# Patient Record
Sex: Female | Born: 1950 | Hispanic: No | Marital: Married
Health system: Southern US, Academic
[De-identification: ages and names within clinical notes are randomized; demographics above are authoritative.]

## PROBLEM LIST (undated history)

## (undated) ENCOUNTER — Ambulatory Visit

## (undated) ENCOUNTER — Encounter

## (undated) ENCOUNTER — Telehealth

## (undated) ENCOUNTER — Encounter
Attending: Student in an Organized Health Care Education/Training Program | Primary: Student in an Organized Health Care Education/Training Program

## (undated) ENCOUNTER — Ambulatory Visit: Payer: Medicare (Managed Care) | Attending: Dermatology | Primary: Dermatology

## (undated) ENCOUNTER — Ambulatory Visit
Payer: MEDICARE | Attending: Student in an Organized Health Care Education/Training Program | Primary: Student in an Organized Health Care Education/Training Program

## (undated) ENCOUNTER — Ambulatory Visit: Payer: MEDICARE | Attending: Dermatology | Primary: Dermatology

## (undated) ENCOUNTER — Encounter: Attending: Dermatology | Primary: Dermatology

## (undated) DIAGNOSIS — K219 Gastro-esophageal reflux disease without esophagitis: Secondary | ICD-10-CM

## (undated) DIAGNOSIS — R079 Chest pain, unspecified: Secondary | ICD-10-CM

## (undated) DIAGNOSIS — E7800 Pure hypercholesterolemia, unspecified: Secondary | ICD-10-CM

## (undated) DIAGNOSIS — B029 Zoster without complications: Secondary | ICD-10-CM

## (undated) DIAGNOSIS — K759 Inflammatory liver disease, unspecified: Secondary | ICD-10-CM

## (undated) DIAGNOSIS — L409 Psoriasis, unspecified: Secondary | ICD-10-CM

## (undated) DIAGNOSIS — I10 Essential (primary) hypertension: Secondary | ICD-10-CM

## (undated) DIAGNOSIS — R42 Dizziness and giddiness: Secondary | ICD-10-CM

## (undated) DIAGNOSIS — N200 Calculus of kidney: Secondary | ICD-10-CM

## (undated) DIAGNOSIS — R634 Abnormal weight loss: Secondary | ICD-10-CM

## (undated) DIAGNOSIS — R000 Tachycardia, unspecified: Secondary | ICD-10-CM

## (undated) DIAGNOSIS — G43909 Migraine, unspecified, not intractable, without status migrainosus: Secondary | ICD-10-CM

## (undated) DIAGNOSIS — E119 Type 2 diabetes mellitus without complications: Secondary | ICD-10-CM

## (undated) HISTORY — DX: Psoriasis, unspecified: L40.9

## (undated) HISTORY — DX: Abnormal weight loss: R63.4

## (undated) HISTORY — DX: Chest pain, unspecified: R07.9

## (undated) HISTORY — PX: BREAST BIOPSY: SHX20

## (undated) HISTORY — DX: Tachycardia, unspecified: R00.0

## (undated) HISTORY — DX: Migraine, unspecified, not intractable, without status migrainosus: G43.909

---

## 2015-09-26 LAB — HM COLONOSCOPY

## 2017-01-09 ENCOUNTER — Other Ambulatory Visit: Payer: Self-pay

## 2017-01-09 ENCOUNTER — Ambulatory Visit
Admission: EM | Admit: 2017-01-09 | Discharge: 2017-01-09 | Disposition: A | Discharge status: Home / Self Care | Payer: Medicare Other | Attending: Family Medicine | Admitting: Family Medicine

## 2017-01-09 DIAGNOSIS — Z760 Encounter for issue of repeat prescription: Secondary | ICD-10-CM | POA: Diagnosis not present

## 2017-01-09 DIAGNOSIS — R51 Headache: Secondary | ICD-10-CM | POA: Diagnosis not present

## 2017-01-09 DIAGNOSIS — Z794 Long term (current) use of insulin: Secondary | ICD-10-CM

## 2017-01-09 DIAGNOSIS — I10 Essential (primary) hypertension: Secondary | ICD-10-CM

## 2017-01-09 DIAGNOSIS — R0789 Other chest pain: Secondary | ICD-10-CM

## 2017-01-09 DIAGNOSIS — E139 Other specified diabetes mellitus without complications: Secondary | ICD-10-CM

## 2017-01-09 HISTORY — DX: Pure hypercholesterolemia, unspecified: E78.00

## 2017-01-09 HISTORY — DX: Calculus of kidney: N20.0

## 2017-01-09 HISTORY — DX: Type 2 diabetes mellitus without complications: E11.9

## 2017-01-09 HISTORY — DX: Gastro-esophageal reflux disease without esophagitis: K21.9

## 2017-01-09 HISTORY — DX: Essential (primary) hypertension: I10

## 2017-01-09 NOTE — ED Triage Notes (Signed)
Pt reports she is visiting her son and from [place] for 2 months and is running out of her meds. Takes 11 medications, 10 of which are prescription. FSBS this a.m. Was 158.

## 2017-01-09 NOTE — ED Provider Notes (Signed)
[facility]    CSN: [id number] Arrival date & time: 01/09/17  1240     History   Chief Complaint Chief Complaint  Patient presents with  . Medication Refill    HPI Elizabeth Klein is a 66 y.o. female.   Patient is a 67 year old female who presents with her son who states that she is visiting from [place] and has been here for a couple months and is beginning to run out of her medications. She states she has run out of her Trulicity and has actually been out for 2-3 weeks. She reports that she has about 4-5 days of the rest of her medications. She reports occasional headache and some intermittent chest pain. Chest pain she reports will last for approximately 10 seconds but relieves with just rest. She reports she last saw her doctor back in September. She also reports acid reflux symptoms as well. She denies any shortness of breath and states that the cold weather has actually been improvement in her respiratory symptoms.  Patient states that [facility] has not [facility] in [place], or her doctor regarding running out of medicines.       Past Medical History:  Diagnosis Date  . Acid reflux   . Diabetes mellitus without complication ([place])   . High cholesterol   . Hypertension   . Kidney stones     There are no active problems to display for this patient.   Past Surgical History:  Procedure Laterality Date  . CESAREAN SECTION      OB History    No data available       Home Medications    Prior to Admission medications   Medication Sig Start Date End Date Taking? Authorizing Provider  aspirin EC 81 MG tablet Take 81 mg daily by mouth.   Yes [provider]  atorvastatin (LIPITOR) 80 MG tablet Take 80 mg daily by mouth.   Yes [provider]  Dulaglutide (TRULICITY) 8.14 GY/1.8HU SOPN Inject into the skin.   Yes [provider]  fenofibrate 160 MG tablet Take 160 mg daily by  mouth.   Yes [provider]  insulin glargine (LANTUS) 100 UNIT/ML injection Inject 30 Units at bedtime into the skin.   Yes [provider]  losartan (COZAAR) 25 MG tablet Take 25 mg daily by mouth.   Yes [provider]  metFORMIN (GLUCOPHAGE) 1000 MG tablet Take 1,000 mg 2 (two) times daily with a meal by mouth.   Yes [provider]  metoprolol tartrate (LOPRESSOR) 50 MG tablet Take 50 mg 2 (two) times daily by mouth.   Yes [provider]  montelukast (SINGULAIR) 10 MG tablet Take 10 mg at bedtime by mouth.   Yes [provider]  omeprazole (PRILOSEC) 20 MG capsule Take 20 mg daily by mouth.   Yes [provider]  traZODone (DESYREL) 50 MG tablet Take 50 mg at bedtime by mouth.   Yes [provider]    Family History Family History  Problem Relation Age of Onset  . Diabetes Brother   . Diabetes Brother   . Diabetes Brother     Social History Social History   Tobacco Use  . Smoking status: Never Smoker  . Smokeless tobacco: Never Used  Substance Use Topics  . Alcohol use: No    Frequency: Never  . Drug use: No     Allergies   Patient has no known allergies.   Review  of Systems Review of Systems  As noted above in history of present illness. Other systems reviewed and found to be negative.   Physical Exam Triage Vital Signs ED Triage Vitals  Enc Vitals Group     BP 01/09/17 1302 121/66     Pulse Rate 01/09/17 1302 65     Resp 01/09/17 1302 16     Temp 01/09/17 1302 98 F (36.7 C)     Temp Source 01/09/17 1302 Oral     SpO2 01/09/17 1302 100 %     Weight 01/09/17 1302 138 lb (62.6 kg)     Height 01/09/17 1302 5' (1.524 m)     Head Circumference --      Peak Flow --      Pain Score 01/09/17 1303 4     Pain Loc --      Pain Edu? --      Excl. in [place]? --    No data found.  Updated Vital Signs BP 121/66 (BP Location: Left Arm)   Pulse 65   Temp 98 F (36.7 C) (Oral)   Resp 16   Ht  5' (1.524 m)   Wt 138 lb (62.6 kg)   SpO2 100%   BMI 26.95 kg/m   Visual Acuity Right Eye Distance:   Left Eye Distance:   Bilateral Distance:    Right Eye Near:   Left Eye Near:    Bilateral Near:     Physical Exam  Constitutional: She is oriented to person, place, and time. She appears well-developed and well-nourished. She appears distressed.  HENT:  Head: Atraumatic.  Eyes: EOM are normal. Pupils are equal, round, and reactive to light.  Cardiovascular: Normal rate, regular rhythm and normal heart sounds.  No murmur heard. Pulmonary/Chest: Effort normal and breath sounds normal. No stridor. No respiratory distress. She has no wheezes.  Abdominal: Soft. Bowel sounds are normal.  Musculoskeletal: Normal range of motion.  Neurological: She is alert and oriented to person, place, and time. No cranial nerve deficit.  Skin: Skin is warm and dry.     UC Treatments / Results  Labs (all labs ordered are listed, but only abnormal results are displayed) Labs Reviewed - No data to display  EKG  EKG Interpretation None       Radiology No results found.  Procedures Procedures (including critical care time)  Medications Ordered in UC Medications - No data to display   Initial Impression / Assessment and Plan / UC Course  I have reviewed the triage vital signs and the nursing notes.  Pertinent labs & imaging results that were available during my care of the patient were reviewed by me and considered in my medical decision making (see chart for details).    [facility]'re able to refill her trazodone and Trulicity today and will have that ready for her. However her other medications were received on September 17 with 90 day supplies.   Final Clinical Impressions(s) / UC Diagnoses   Final diagnoses:  None   After speaking with patient's heard her [facility] and [facility]odone. I recommend him taking a list of her  medications and how she was taking them to make sure that she was given the right amount of medications. This is something that would be best worked out [facility] so she will he has medications available on refill after the 90 days because she has not set up with a new  primary care provider she is still unsure of moving here.  ED Discharge Orders    None       Controlled Substance Prescriptions [place] Controlled Substance Registry consulted? Not Applicable   Luvenia Redden, PA-C 01/09/17 1350

## 2017-01-09 NOTE — Discharge Instructions (Signed)
-  [facility] [place] -[facility] and [facility] and how taking[facility]December 17

## 2017-03-19 ENCOUNTER — Ambulatory Visit (INDEPENDENT_AMBULATORY_CARE_PROVIDER_SITE_OTHER): Payer: Self-pay | Admitting: Family Medicine

## 2017-03-19 ENCOUNTER — Encounter: Payer: Self-pay | Admitting: Family Medicine

## 2017-03-19 VITALS — BP 110/77 | HR 77 | Resp 16 | Ht 60.0 in | Wt 144.6 lb

## 2017-03-19 DIAGNOSIS — E1165 Type 2 diabetes mellitus with hyperglycemia: Secondary | ICD-10-CM

## 2017-03-19 DIAGNOSIS — Z87442 Personal history of urinary calculi: Secondary | ICD-10-CM

## 2017-03-19 DIAGNOSIS — G4700 Insomnia, unspecified: Secondary | ICD-10-CM

## 2017-03-19 DIAGNOSIS — R2681 Unsteadiness on feet: Secondary | ICD-10-CM

## 2017-03-19 DIAGNOSIS — F5101 Primary insomnia: Secondary | ICD-10-CM | POA: Insufficient documentation

## 2017-03-19 DIAGNOSIS — J309 Allergic rhinitis, unspecified: Secondary | ICD-10-CM | POA: Insufficient documentation

## 2017-03-19 DIAGNOSIS — Z1239 Encounter for other screening for malignant neoplasm of breast: Secondary | ICD-10-CM

## 2017-03-19 DIAGNOSIS — E1169 Type 2 diabetes mellitus with other specified complication: Secondary | ICD-10-CM | POA: Insufficient documentation

## 2017-03-19 DIAGNOSIS — L409 Psoriasis, unspecified: Secondary | ICD-10-CM

## 2017-03-19 DIAGNOSIS — I2510 Atherosclerotic heart disease of native coronary artery without angina pectoris: Secondary | ICD-10-CM

## 2017-03-19 DIAGNOSIS — E1121 Type 2 diabetes mellitus with diabetic nephropathy: Secondary | ICD-10-CM

## 2017-03-19 DIAGNOSIS — Z1331 Encounter for screening for depression: Secondary | ICD-10-CM

## 2017-03-19 DIAGNOSIS — L281 Prurigo nodularis: Secondary | ICD-10-CM | POA: Insufficient documentation

## 2017-03-19 DIAGNOSIS — I10 Essential (primary) hypertension: Secondary | ICD-10-CM

## 2017-03-19 DIAGNOSIS — E785 Hyperlipidemia, unspecified: Secondary | ICD-10-CM

## 2017-03-19 DIAGNOSIS — E119 Type 2 diabetes mellitus without complications: Secondary | ICD-10-CM

## 2017-03-19 DIAGNOSIS — Z1231 Encounter for screening mammogram for malignant neoplasm of breast: Secondary | ICD-10-CM

## 2017-03-19 DIAGNOSIS — Z23 Encounter for immunization: Secondary | ICD-10-CM

## 2017-03-19 DIAGNOSIS — Z794 Long term (current) use of insulin: Secondary | ICD-10-CM

## 2017-03-19 DIAGNOSIS — G4762 Sleep related leg cramps: Secondary | ICD-10-CM

## 2017-03-19 DIAGNOSIS — E118 Type 2 diabetes mellitus with unspecified complications: Secondary | ICD-10-CM | POA: Insufficient documentation

## 2017-03-19 MED ORDER — ZOSTER VAC RECOMB ADJUVANTED 50 MCG/0.5ML IM SUSR: 0.5000 mL | Freq: Once | INTRAMUSCULAR | 1 refills | Status: AC

## 2017-03-19 MED ORDER — TETANUS-DIPHTH-ACELL PERTUSSIS 5-2.5-18.5 LF-MCG/0.5 IM SUSP: 0.5000 mL | Freq: Once | INTRAMUSCULAR | 0 refills | Status: AC

## 2017-03-19 NOTE — Patient Instructions (Signed)
Taper off omeprazole (take one tablet every other day for 2 weeks then every third day for two weeks then stop). You may also stop fish oil.

## 2017-03-19 NOTE — Progress Notes (Signed)
Date:  03/19/2017   Name:  Elizabeth Klein   DOB:  01/16/1951   MRN:  [id number]  PCP:  Adline Potter, MD    Chief Complaint: Establish Care (Moved from [place].... ) and Diabetes (BS ranges 85-140 but often can get to over 200 in afternoon. Lowest was 2 years ago or so and it dropped to 33.)   History of Present Illness:  This is a 67 y.o. female seen for initial visit. Moved here from [place] to be near son. T2DM on metformin/Lantus/Trulicity, BGs well controlled at home. Insomnia usually well controlled on trazodone past year. Takes omeprazole since UGI last year showed gastritis, no current sxs, has not tried taper. Kidney stone 2018, none since. Psoriasis intolerant [place], on Lidex and [place], would like to see derm locally. Singulair for allergies/asthma (not sure which). CT angiogram last year showed CAD, no stenting recommended, Lipitor dose increased then. On metoprolol for palpitations, on losartan but denies HTN or microalbuminuria. Take vit C for cold prevention and B complex for joints, also fish oil for heart. Told cataracts OU but not bad enough to operate, saw optho in August. C/o NLC and occ numbness in feet. Father died 72 old age, mother died 9 CVA, two brothers died heart dz, sister with DM. Colonoscopy 2018 showed polyps. Had flu imm in Sept, pneumo imm two years ago, tet status unknown, no zoster imm, no mammo in 6 years.  Review of Systems:  Review of Systems  Constitutional: Negative for chills and fever.  HENT: Negative for ear pain, sore throat and trouble swallowing.   Eyes: Negative for pain.  Respiratory: Negative for cough and shortness of breath.   Cardiovascular: Negative for chest pain and leg swelling.  Gastrointestinal: Negative for abdominal pain.  Endocrine: Negative for polydipsia and polyuria.  Genitourinary: Negative for difficulty urinating and flank pain.  Musculoskeletal: Negative for joint swelling.  Neurological: Negative for syncope  and light-headedness.  Hematological: Negative for adenopathy.    Patient Active Problem List   Diagnosis Date Noted  . Positive depression screening 03/19/2017  . Diabetes mellitus type 2, controlled, without complications ([place]) 41/32/4401  . Hypertension 03/19/2017  . CAD (coronary artery disease) 03/19/2017  . History of kidney stones 03/19/2017  . Psoriasis 03/19/2017  . Hyperlipidemia 03/19/2017  . Nocturnal leg cramps 03/19/2017  . Gait instability 03/19/2017  . Insomnia 03/19/2017    Prior to Admission medications   Medication Sig Start Date End Date Taking? Authorizing Provider  aspirin EC 81 MG tablet Take 81 mg daily by mouth.   Yes [provider]  atorvastatin (LIPITOR) 80 MG tablet Take 80 mg daily by mouth.   Yes [provider]  b complex vitamins capsule Take 1 capsule by mouth daily.   Yes [provider]  Calcipotriene-Betameth Diprop (ENSTILAR) 0.005-0.064 % FOAM Apply 1 application topically 2 (two) times daily.   Yes [provider]  Dulaglutide (TRULICITY) 0.27 OZ/3.6UY SOPN Inject 0.75 mg into the skin once a week.   Yes [provider]  fluocinonide ointment (LIDEX) 4.03 % Apply 1 application topically 2 (two) times daily.   Yes [provider]  insulin glargine (LANTUS) 100 UNIT/ML injection Inject 35 Units into the skin at bedtime.    Yes [provider]  losartan (COZAAR) 25 MG tablet Take 25 mg daily by mouth.   Yes [provider]  metFORMIN (GLUCOPHAGE) 1000 MG tablet Take 1,000 mg 2 (two) times daily with a meal by mouth.  Yes [provider]  metoprolol tartrate (LOPRESSOR) 50 MG tablet Take 50 mg 2 (two) times daily by mouth.   Yes [provider]  montelukast (SINGULAIR) 10 MG tablet Take 10 mg at bedtime by mouth.   Yes [provider]  traZODone (DESYREL) 50 MG tablet Take 50 mg at bedtime by mouth.   Yes [provider]  vitamin C (ASCORBIC  ACID) 500 MG tablet Take 500 mg by mouth daily.   Yes [provider]  Tdap (BOOSTRIX) 5-2.5-18.5 LF-MCG/0.5 injection Inject 0.5 mLs into the muscle once for 1 dose. 03/19/17 03/19/17  Aleck Locklin, Gwyndolyn Saxon, MD  Zoster Vaccine Adjuvanted [facility]) injection Inject 0.5 mLs into the muscle once for 1 dose. 03/19/17 03/19/17  Adline Potter, MD    No Known Allergies  Past Surgical History:  Procedure Laterality Date  . CESAREAN SECTION      Social History   Tobacco Use  . Smoking status: Never Smoker  . Smokeless tobacco: Never Used  Substance Use Topics  . Alcohol use: No    Frequency: Never  . Drug use: No    Family History  Problem Relation Age of Onset  . Diabetes Brother   . Heart attack Brother   . Diabetes Brother   . Heart attack Brother   . Diabetes Brother     Medication list has been reviewed and updated.  Physical Examination: BP 110/77   Pulse 77   Resp 16   Ht 5' (1.524 m)   Wt 144 lb 9.6 oz (65.6 kg)   SpO2 98%   BMI 28.24 kg/m   Physical Exam  Constitutional: She is oriented to person, place, and time. She appears well-developed and well-nourished.  HENT:  Head: Normocephalic and atraumatic.  Right Ear: External ear normal.  Left Ear: External ear normal.  Nose: Nose normal.  Mouth/Throat: Oropharynx is clear and moist.  TMs clear  Eyes: Conjunctivae and EOM are normal. Pupils are equal, round, and reactive to light.  Neck: Neck supple. No thyromegaly present.  Cardiovascular: Normal rate, regular rhythm and normal heart sounds.  Pulmonary/Chest: Effort normal and breath sounds normal.  Abdominal: Soft. She exhibits no distension and no mass. There is no tenderness.  Musculoskeletal: She exhibits no edema.  Romberg wobbly, gait with en bloc turning  Lymphadenopathy:    She has no cervical adenopathy.  Neurological: She is alert and oriented to person, place, and time. Coordination normal.  SLUMS 27/30  Skin: Skin is warm and dry.  Scatter  psoriatic plaques  Psychiatric: She has a normal mood and affect. Her behavior is normal.  GDS 4/15  Nursing note and vitals reviewed.   Assessment and Plan:  1. Controlled type 2 diabetes mellitus with neuropathy, with long-term current use of insulin (HCC) Well controlled on metformin/Lantus/Trulicity, consider gabapentin - TSH - HgB A1c - Urine Microalbumin w/creat. ratio  2. Essential hypertension Well controlled on metoprolol/losartan - Comprehensive Metabolic Panel (CMET) - CBC  3. Coronary artery disease involving native coronary artery of native heart without angina pectoris Stable on BB/ARB/statin/asa  4. Insomnia, unspecified type Well controlled on trazodone  5. Psoriasis Marginal control on Lidex/Enstilar - Ambulatory referral to Dermatology  6. Hyperlipidemia, unspecified hyperlipidemia type On increased dose Lipitor - Lipid Profile  7. Nocturnal leg cramps Unclear etiology  8. Gait instability - B12  9. Allergic rhinitis, unspecified seasonality, unspecified trigger Adequate control on Singulair, may be able to d/c  10. History of kidney stones  11. Positive depression screening  Consider changing trazodone to Remeron  12. Breast cancer screening - MM Digital Screening; Future  13. Need for pneumococcal vaccination - Pneumococcal conjugate vaccine 13-valent  14. Need for diphtheria-tetanus-pertussis (Tdap) vaccine - Tdap (BOOSTRIX) 5-2.5-18.5 LF-MCG/0.5 injection; Inject 0.5 mLs into the muscle once for 1 dose.  Dispense: 0.5 mL; Refill: 0  15. Need for zoster vaccination - Zoster Vaccine Adjuvanted [facility]) injection; Inject 0.5 mLs into the muscle once for 1 dose.  Dispense: 0.5 mL; Refill: 1  Return in about 4 weeks (around 04/16/2017).  One hour spent with pt/son over half in counseling  Clemie General M. [facility]  03/19/2017

## 2017-03-20 ENCOUNTER — Other Ambulatory Visit: Payer: Self-pay | Admitting: Family Medicine

## 2017-03-20 LAB — COMPREHENSIVE METABOLIC PANEL
ALBUMIN: 4.5 g/dL (ref 3.6–4.8)
ALK PHOS: 44 IU/L (ref 39–117)
ALT: 37 IU/L — ABNORMAL HIGH (ref 0–32)
AST: 34 IU/L (ref 0–40)
Albumin/Globulin Ratio: 1.6 (ref 1.2–2.2)
BUN / CREAT RATIO: 22 (ref 12–28)
BUN: 26 mg/dL (ref 8–27)
Bilirubin Total: <0.2 mg/dL (ref 0.0–1.2)
CO2: 22 mmol/L (ref 20–29)
Calcium: 10 mg/dL (ref 8.7–10.3)
Chloride: 104 mmol/L (ref 96–106)
Creatinine, Ser: 1.2 mg/dL — ABNORMAL HIGH (ref 0.57–1.00)
GFR, EST AFRICAN AMERICAN: 55 mL/min/{1.73_m2} — AB (ref >59)
GFR, EST NON AFRICAN AMERICAN: 48 mL/min/{1.73_m2} — AB (ref >59)
GLOBULIN, TOTAL: 2.9 g/dL (ref 1.5–4.5)
Glucose: 198 mg/dL — ABNORMAL HIGH (ref 65–99)
Potassium: 5.6 mmol/L — ABNORMAL HIGH (ref 3.5–5.2)
SODIUM: 141 mmol/L (ref 134–144)
TOTAL PROTEIN: 7.4 g/dL (ref 6.0–8.5)

## 2017-03-20 LAB — CBC
HEMATOCRIT: 36.8 % (ref 34.0–46.6)
Hemoglobin: 11.9 g/dL (ref 11.1–15.9)
MCH: 28.6 pg (ref 26.6–33.0)
MCHC: 32.3 g/dL (ref 31.5–35.7)
MCV: 89 fL (ref 79–97)
PLATELETS: 429 10*3/uL — AB (ref 150–379)
RBC: 4.16 x10E6/uL (ref 3.77–5.28)
RDW: 14.2 % (ref 12.3–15.4)
WBC: 10.4 10*3/uL (ref 3.4–10.8)

## 2017-03-20 LAB — MICROALBUMIN / CREATININE URINE RATIO
Creatinine, Urine: 150.2 mg/dL
Microalb/Creat Ratio: 51.7 mg/g creat — ABNORMAL HIGH (ref 0.0–30.0)
Microalbumin, Urine: 77.7 ug/mL

## 2017-03-20 LAB — LIPID PANEL
CHOL/HDL RATIO: 3.6 ratio (ref 0.0–4.4)
Cholesterol, Total: 149 mg/dL (ref 100–199)
HDL: 41 mg/dL (ref >39)
LDL Calculated: 85 mg/dL (ref 0–99)
Triglycerides: 116 mg/dL (ref 0–149)
VLDL Cholesterol Cal: 23 mg/dL (ref 5–40)

## 2017-03-20 LAB — HEMOGLOBIN A1C
Est. average glucose Bld gHb Est-mCnc: 232 mg/dL
HEMOGLOBIN A1C: 9.7 % — AB (ref 4.8–5.6)

## 2017-03-20 LAB — TSH: TSH: 1.9 u[IU]/mL (ref 0.450–4.500)

## 2017-03-20 LAB — VITAMIN B12: Vitamin B-12: 408 pg/mL (ref 232–1245)

## 2017-03-20 MED ORDER — LOSARTAN POTASSIUM 50 MG PO TABS: 50.0000 mg | ORAL_TABLET | Freq: Every day | ORAL | 2 refills | Status: DC

## 2017-03-20 MED ORDER — INSULIN GLARGINE 100 UNIT/ML ~~LOC~~ SOLN: 45.0000 [IU] | Freq: Every day | SUBCUTANEOUS | Status: DC

## 2017-04-16 ENCOUNTER — Ambulatory Visit: Payer: Medicare Other | Admitting: Family Medicine

## 2017-04-30 ENCOUNTER — Ambulatory Visit: Payer: Medicare Other | Admitting: Family Medicine

## 2017-04-30 ENCOUNTER — Encounter: Payer: Self-pay | Admitting: Family Medicine

## 2017-04-30 VITALS — BP 124/82 | HR 84 | Resp 16 | Ht 60.0 in | Wt 147.0 lb

## 2017-04-30 DIAGNOSIS — I2510 Atherosclerotic heart disease of native coronary artery without angina pectoris: Secondary | ICD-10-CM | POA: Diagnosis not present

## 2017-04-30 DIAGNOSIS — E1140 Type 2 diabetes mellitus with diabetic neuropathy, unspecified: Secondary | ICD-10-CM

## 2017-04-30 DIAGNOSIS — E1122 Type 2 diabetes mellitus with diabetic chronic kidney disease: Secondary | ICD-10-CM | POA: Diagnosis not present

## 2017-04-30 DIAGNOSIS — Z0000 Encounter for general adult medical examination without abnormal findings: Secondary | ICD-10-CM | POA: Diagnosis not present

## 2017-04-30 DIAGNOSIS — E785 Hyperlipidemia, unspecified: Secondary | ICD-10-CM

## 2017-04-30 DIAGNOSIS — L409 Psoriasis, unspecified: Secondary | ICD-10-CM

## 2017-04-30 DIAGNOSIS — G4700 Insomnia, unspecified: Secondary | ICD-10-CM | POA: Diagnosis not present

## 2017-04-30 DIAGNOSIS — M79672 Pain in left foot: Secondary | ICD-10-CM | POA: Diagnosis not present

## 2017-04-30 DIAGNOSIS — R809 Proteinuria, unspecified: Secondary | ICD-10-CM

## 2017-04-30 DIAGNOSIS — N183 Chronic kidney disease, stage 3 unspecified: Secondary | ICD-10-CM | POA: Insufficient documentation

## 2017-04-30 DIAGNOSIS — I10 Essential (primary) hypertension: Secondary | ICD-10-CM | POA: Diagnosis not present

## 2017-04-30 MED ORDER — METOPROLOL SUCCINATE ER 100 MG PO TB24: 100.0000 mg | ORAL_TABLET | Freq: Every day | ORAL | 2 refills | Status: DC

## 2017-04-30 MED ORDER — FLUOCINONIDE 0.05 % EX OINT: 1.0000 "application " | TOPICAL_OINTMENT | Freq: Two times a day (BID) | CUTANEOUS | 2 refills | Status: DC

## 2017-04-30 MED ORDER — GLUCOSE BLOOD VI STRP: ORAL_STRIP | 12 refills | Status: DC

## 2017-04-30 MED ORDER — SYRINGE (DISPOSABLE) 30 ML MISC: 12 refills | Status: DC

## 2017-04-30 MED ORDER — INSULIN GLARGINE 100 UNIT/ML ~~LOC~~ SOLN: 45.0000 [IU] | Freq: Every day | SUBCUTANEOUS | 2 refills | Status: DC

## 2017-04-30 MED ORDER — ACCU-CHEK FASTCLIX LANCETS MISC: 12 refills | Status: DC

## 2017-04-30 NOTE — Patient Instructions (Addendum)
Attempt to wean off omeprazole. Stop montelukast.

## 2017-04-30 NOTE — Progress Notes (Signed)
Date:  04/30/2017   Name:  Elizabeth Klein   DOB:  Jul 19, 1950   MRN:  [id number]  PCP:  Adline Potter, MD    Chief Complaint: Diabetes (BS range 110-120 - use mail order -needs syringe for insulin. ); Fatigue (very tired and takes a while to do things around house. ); and Hypertension ([facility] then the rest at [facility] order.)   History of Present Illness:  This is a 67 y.o. female seen for one month f/u from initial visit. T2DM marginally controlled, Lantus increased last visit. Losartan also increased due to elevated MCR. Has not yet seen derm for psoriasis. No sign AR or asthma sxs. Still taking Prilosec qhs. Admits might be depressed but declines antidepressant, insomnia well controlled on trazodone. Needs refills metoprolol, Lantus, Lidex.  Review of Systems:  Review of Systems  Constitutional: Negative for chills and fever.  Respiratory: Negative for cough and shortness of breath.   Cardiovascular: Negative for chest pain and leg swelling.  Genitourinary: Negative for difficulty urinating.  Neurological: Negative for syncope and light-headedness.    Patient Active Problem List   Diagnosis Date Noted  . Microalbuminuria 04/30/2017  . Positive depression screening 03/19/2017  . Type 2 diabetes, controlled, with neuropathy ([place]) 03/19/2017  . Hypertension 03/19/2017  . CAD (coronary artery disease) 03/19/2017  . History of kidney stones 03/19/2017  . Psoriasis 03/19/2017  . Hyperlipidemia 03/19/2017  . Nocturnal leg cramps 03/19/2017  . Gait instability 03/19/2017  . Insomnia 03/19/2017  . Allergic rhinitis 03/19/2017    Prior to Admission medications   Medication Sig Start Date End Date Taking? Authorizing Provider  acetaminophen (TYLENOL) 500 MG tablet Take 1,000 mg by mouth every 8 (eight) hours as needed.   Yes [provider]  aspirin EC 81 MG tablet Take 81 mg daily by mouth.   Yes [provider]  atorvastatin (LIPITOR) 80 MG  tablet Take 80 mg daily by mouth.   Yes [provider]  b complex vitamins capsule Take 1 capsule by mouth daily.   Yes [provider]  Calcipotriene-Betameth Diprop (ENSTILAR) 0.005-0.064 % FOAM Apply 1 application topically 2 (two) times daily.   Yes [provider]  Dulaglutide (TRULICITY) 8.52 DP/8.2UM SOPN Inject 0.75 mg into the skin once a week.   Yes [provider]  fluocinonide ointment (LIDEX) 3.53 % Apply 1 application topically 2 (two) times daily. 04/30/17  Yes Kyran Whittier, Gwyndolyn Saxon, MD  insulin glargine (LANTUS) 100 UNIT/ML injection Inject 0.45 mLs (45 Units total) into the skin at bedtime. 04/30/17  Yes Syndey Jaskolski, Gwyndolyn Saxon, MD  losartan (COZAAR) 50 MG tablet Take 1 tablet (50 mg total) by mouth daily. 03/20/17  Yes Therron Sells, Gwyndolyn Saxon, MD  metFORMIN (GLUCOPHAGE) 1000 MG tablet Take 1,000 mg 2 (two) times daily with a meal by mouth.   Yes [provider]  traZODone (DESYREL) 50 MG tablet Take 50 mg at bedtime by mouth.   Yes [provider]  vitamin C (ASCORBIC ACID) 500 MG tablet Take 500 mg by mouth daily.   Yes [provider]  ACCU-CHEK FASTCLIX LANCETS MISC Use with accucheck machine to check BS up to 3 times daily for DM2 ICD10 E11.9 04/30/17   Jeromie Gainor, Gwyndolyn Saxon, MD  glucose blood test strip Use to check BS with Accucheck Meter up to 3 times daily for DM2 E11.9 04/30/17   Chandy Tarman, Gwyndolyn Saxon, MD  metoprolol succinate (TOPROL-XL) 100 MG 24 hr tablet Take 1 tablet (100 mg total) by mouth daily.  Take with or immediately following a meal. 04/30/17   Jemiah [place]burg, Gwyndolyn Saxon, MD  Syringe, Disposable, (B-D 30CC SYRINGE) 30 ML MISC Use with Lantus Injections for DM2 ICD 10 E11.9 04/30/17   Adline Potter, MD    No Known Allergies  Past Surgical History:  Procedure Laterality Date  . CESAREAN SECTION      Social History   Tobacco Use  . Smoking status: Never Smoker  . Smokeless tobacco: Never Used  Substance Use Topics  . Alcohol use: No    Frequency:  Never  . Drug use: No    Family History  Problem Relation Age of Onset  . Diabetes Brother   . Heart attack Brother   . Diabetes Brother   . Heart attack Brother   . Diabetes Brother     Medication list has been reviewed and updated.  Physical Examination: BP 124/82   Pulse 84   Resp 16   Ht 5' (1.524 m)   Wt 147 lb (66.7 kg)   SpO2 98%   BMI 28.71 kg/m   Physical Exam  Constitutional: She appears well-developed and well-nourished.  Cardiovascular: Normal rate, regular rhythm and normal heart sounds.  Pulmonary/Chest: Effort normal and breath sounds normal.  Musculoskeletal: She exhibits no edema.  Neurological: She is alert.  Skin: Skin is warm and dry.  Psychiatric: She has a normal mood and affect. Her behavior is normal.  Nursing note and vitals reviewed.   Assessment and Plan:  1. Type 2 diabetes, controlled, with neuropathy ([place]) Marginal control last visit on metformin/Lantus/Trulicity, on increased Lantus, MNT referral, consider gabapentin - HgB A1c  2. CKD stage 3 due to type 2 diabetes mellitus (HCC) Recheck BMP, on increased losartan, avoid NSAIDS, check vit D level next visit  3. Coronary artery disease involving native coronary artery of native heart without angina pectoris Stable on BB/ARB/statin/asa  4. Essential hypertension Well controlled on metoprolol/losartan - Basic Metabolic Panel (BMET)  5. Psoriasis Derm referral pending, refill Lidex for now  6. Hyperlipidemia, unspecified hyperlipidemia type Well controlled on Lipitor  7. Insomnia, unspecified type Adequate control on trazodone, consider change to Remeron given positive depression screen  8. Microalbuminuria On increased losartan - Urine Microalbumin w/creat. ratio  9. Left foot pain - Ambulatory referral to Podiatry   10. Healthcare maintenance Tdap/Shingrix/mammo ordered last visit, consider HIV/hep C next visit, needs optho exam  11. Med review D/c Singulair, attempt  Prilosec taper, consider d/c vit C  Return in about 4 weeks (around 05/28/2017).  Satira Anis. [facility]  04/30/2017

## 2017-05-01 ENCOUNTER — Other Ambulatory Visit: Payer: Self-pay | Admitting: Family Medicine

## 2017-05-01 DIAGNOSIS — Z1231 Encounter for screening mammogram for malignant neoplasm of breast: Secondary | ICD-10-CM

## 2017-05-02 DIAGNOSIS — E1140 Type 2 diabetes mellitus with diabetic neuropathy, unspecified: Secondary | ICD-10-CM | POA: Diagnosis not present

## 2017-05-02 DIAGNOSIS — R809 Proteinuria, unspecified: Secondary | ICD-10-CM | POA: Diagnosis not present

## 2017-05-02 DIAGNOSIS — I10 Essential (primary) hypertension: Secondary | ICD-10-CM | POA: Diagnosis not present

## 2017-05-03 LAB — HEMOGLOBIN A1C
ESTIMATED AVERAGE GLUCOSE: 255 mg/dL
Hgb A1c MFr Bld: 10.5 % — ABNORMAL HIGH (ref 4.8–5.6)

## 2017-05-03 LAB — BASIC METABOLIC PANEL
BUN / CREAT RATIO: 16 (ref 12–28)
BUN: 19 mg/dL (ref 8–27)
CHLORIDE: 100 mmol/L (ref 96–106)
CO2: 22 mmol/L (ref 20–29)
Calcium: 10.1 mg/dL (ref 8.7–10.3)
Creatinine, Ser: 1.16 mg/dL — ABNORMAL HIGH (ref 0.57–1.00)
GFR calc Af Amer: 57 mL/min/{1.73_m2} — ABNORMAL LOW (ref >59)
GFR calc non Af Amer: 50 mL/min/{1.73_m2} — ABNORMAL LOW (ref >59)
Glucose: 170 mg/dL — ABNORMAL HIGH (ref 65–99)
Potassium: 5.1 mmol/L (ref 3.5–5.2)
SODIUM: 138 mmol/L (ref 134–144)

## 2017-05-03 LAB — MICROALBUMIN / CREATININE URINE RATIO
CREATININE, UR: 109.5 mg/dL
MICROALB/CREAT RATIO: 101.2 mg/g{creat} — AB (ref 0.0–30.0)
Microalbumin, Urine: 110.8 ug/mL

## 2017-05-05 ENCOUNTER — Encounter: Payer: Self-pay | Admitting: Radiology

## 2017-05-05 ENCOUNTER — Other Ambulatory Visit: Payer: Self-pay | Admitting: Family Medicine

## 2017-05-05 ENCOUNTER — Ambulatory Visit
Admission: RE | Admit: 2017-05-05 | Discharge: 2017-05-05 | Disposition: A | Discharge status: Home / Self Care | Payer: Medicare Other | Source: Ambulatory Visit | Attending: Family Medicine | Admitting: Family Medicine

## 2017-05-05 DIAGNOSIS — Z1231 Encounter for screening mammogram for malignant neoplasm of breast: Secondary | ICD-10-CM | POA: Diagnosis not present

## 2017-05-05 MED ORDER — LOSARTAN POTASSIUM 100 MG PO TABS: 100.0000 mg | ORAL_TABLET | Freq: Every day | ORAL | 2 refills | Status: DC

## 2017-05-05 MED ORDER — INSULIN GLARGINE 100 UNIT/ML ~~LOC~~ SOLN: 50.0000 [IU] | Freq: Every day | SUBCUTANEOUS | 2 refills | Status: DC

## 2017-05-06 ENCOUNTER — Encounter: Payer: Self-pay | Admitting: Family Medicine

## 2017-05-07 DIAGNOSIS — M79672 Pain in left foot: Secondary | ICD-10-CM | POA: Diagnosis not present

## 2017-05-07 DIAGNOSIS — L400 Psoriasis vulgaris: Secondary | ICD-10-CM | POA: Diagnosis not present

## 2017-05-07 DIAGNOSIS — M2011 Hallux valgus (acquired), right foot: Secondary | ICD-10-CM | POA: Diagnosis not present

## 2017-05-07 DIAGNOSIS — M79671 Pain in right foot: Secondary | ICD-10-CM | POA: Diagnosis not present

## 2017-06-02 ENCOUNTER — Ambulatory Visit: Payer: Medicare Other | Admitting: Family Medicine

## 2017-06-02 ENCOUNTER — Other Ambulatory Visit
Admission: RE | Admit: 2017-06-02 | Discharge: 2017-06-02 | Disposition: A | Discharge status: Home / Self Care | Payer: Medicare Other | Source: Ambulatory Visit | Attending: Family Medicine | Admitting: Family Medicine

## 2017-06-02 ENCOUNTER — Encounter: Payer: Self-pay | Admitting: Family Medicine

## 2017-06-02 VITALS — BP 111/78 | HR 81 | Resp 16 | Ht 60.0 in | Wt 144.5 lb

## 2017-06-02 DIAGNOSIS — E785 Hyperlipidemia, unspecified: Secondary | ICD-10-CM

## 2017-06-02 DIAGNOSIS — N183 Chronic kidney disease, stage 3 unspecified: Secondary | ICD-10-CM

## 2017-06-02 DIAGNOSIS — I10 Essential (primary) hypertension: Secondary | ICD-10-CM | POA: Diagnosis not present

## 2017-06-02 DIAGNOSIS — I2510 Atherosclerotic heart disease of native coronary artery without angina pectoris: Secondary | ICD-10-CM | POA: Diagnosis not present

## 2017-06-02 DIAGNOSIS — E1140 Type 2 diabetes mellitus with diabetic neuropathy, unspecified: Secondary | ICD-10-CM | POA: Insufficient documentation

## 2017-06-02 DIAGNOSIS — J309 Allergic rhinitis, unspecified: Secondary | ICD-10-CM

## 2017-06-02 DIAGNOSIS — Z0000 Encounter for general adult medical examination without abnormal findings: Secondary | ICD-10-CM | POA: Insufficient documentation

## 2017-06-02 DIAGNOSIS — E1122 Type 2 diabetes mellitus with diabetic chronic kidney disease: Secondary | ICD-10-CM | POA: Insufficient documentation

## 2017-06-02 DIAGNOSIS — L409 Psoriasis, unspecified: Secondary | ICD-10-CM

## 2017-06-02 DIAGNOSIS — G4700 Insomnia, unspecified: Secondary | ICD-10-CM | POA: Diagnosis not present

## 2017-06-02 DIAGNOSIS — R809 Proteinuria, unspecified: Secondary | ICD-10-CM | POA: Diagnosis not present

## 2017-06-02 LAB — HEMOGLOBIN A1C
HEMOGLOBIN A1C: 9.4 % — AB (ref 4.8–5.6)
Mean Plasma Glucose: 223.08 mg/dL

## 2017-06-02 NOTE — Progress Notes (Signed)
Date:  06/02/2017   Name:  Elizabeth Klein   DOB:  1951-01-14   MRN:  [id number]  PCP:  Adline Potter, MD    Chief Complaint: Diabetes (1 mo f/u -BS 100 avg but had some below 65 and sweating when this happens. Does not try to get numbers up at all. BS does go up to 200 in Afternoon. )   History of Present Illness:  This is a 67 y.o. female seen for one month f/u. Lantus increased to 50 units daily and losartan to 100 mg daily last visit due to elevated a1c/MCR, never saw MNT. Reports occ low AM blood sugars and night sweats, about once a week, but not interested in switching to bid insulin regimen. Increased rhinorrhea and intermittent wheezing off Singulair. Saw derm for psoriasis, on Enstilar foam, saw podiatry for B foot pain, treating conservatively. Insomnia well controlled on trazodone, not interested in switch to Remeron, feels mood improving. C/o intermittent tension headaches.   Review of Systems:  Review of Systems  Constitutional: Negative for chills and fever.  Respiratory: Negative for cough and shortness of breath.   Cardiovascular: Negative for chest pain and leg swelling.  Endocrine: Negative for polydipsia and polyuria.  Genitourinary: Negative for difficulty urinating.  Neurological: Negative for syncope and light-headedness.    Patient Active Problem List   Diagnosis Date Noted  . Microalbuminuria 04/30/2017  . CKD (chronic kidney disease) stage 3, GFR 30-59 ml/min (HCC) 04/30/2017  . Positive depression screening 03/19/2017  . Type 2 diabetes, controlled, with neuropathy ([place]) 03/19/2017  . Hypertension 03/19/2017  . CAD (coronary artery disease) 03/19/2017  . History of kidney stones 03/19/2017  . Psoriasis 03/19/2017  . Hyperlipidemia 03/19/2017  . Nocturnal leg cramps 03/19/2017  . Gait instability 03/19/2017  . Insomnia 03/19/2017  . Allergic rhinitis 03/19/2017    Prior to Admission medications   Medication Sig Start Date End Date Taking?  Authorizing Provider  ACCU-CHEK FASTCLIX LANCETS MISC Use with accucheck machine to check BS up to 3 times daily for DM2 ICD10 E11.9 04/30/17  Yes Jayelyn Barno, Gwyndolyn Saxon, MD  acetaminophen (TYLENOL) 500 MG tablet Take 1,000 mg by mouth every 8 (eight) hours as needed.   Yes [provider]  aspirin EC 81 MG tablet Take 81 mg daily by mouth.   Yes [provider]  atorvastatin (LIPITOR) 80 MG tablet Take 80 mg daily by mouth.   Yes [provider]  Calcipotriene-Betameth Diprop (ENSTILAR) 0.005-0.064 % FOAM Apply 1 application topically 2 (two) times daily.   Yes [provider]  Dulaglutide (TRULICITY) 2.99 BZ/1.6RC SOPN Inject 0.75 mg into the skin once a week.   Yes [provider]  fluocinonide ointment (LIDEX) 7.89 % Apply 1 application topically 2 (two) times daily. 04/30/17  Yes Montrice Montuori, Gwyndolyn Saxon, MD  glucose blood test strip Use to check BS with Accucheck Meter up to 3 times daily for DM2 E11.9 04/30/17  Yes Henlee Donovan, Gwyndolyn Saxon, MD  insulin glargine (LANTUS) 100 UNIT/ML injection Inject 0.5 mLs (50 Units total) into the skin at bedtime. 05/05/17  Yes Altariq Goodall, Gwyndolyn Saxon, MD  losartan (COZAAR) 100 MG tablet Take 1 tablet (100 mg total) by mouth daily. 05/05/17  Yes Orian Amberg, Gwyndolyn Saxon, MD  metFORMIN (GLUCOPHAGE) 1000 MG tablet Take 1,000 mg 2 (two) times daily with a meal by mouth.   Yes [provider]  metoprolol succinate (TOPROL-XL) 100 MG 24 hr tablet Take 1 tablet (100 mg total) by mouth daily. Take with or immediately following a meal. 04/30/17  Yes Collins Kerby, MD  montelukast (SINGULAIR) 10 MG tablet Take 10 mg by mouth daily.   Yes [provider]  Multiple Vitamins-Minerals (MULTIVITAMIN WOMEN 50+) TABS Take 1 tablet by mouth daily.   Yes [provider]  Syringe, Disposable, (B-D 30CC SYRINGE) 30 ML MISC Use with Lantus Injections for DM2 ICD 10 E11.9 04/30/17  Yes Dyonna Jaspers, Gwyndolyn Saxon, MD  traZODone (DESYREL) 50 MG tablet Take 50 mg at bedtime by  mouth.   Yes [provider]    No Known Allergies  Past Surgical History:  Procedure Laterality Date  . BREAST BIOPSY Right    neg  . CESAREAN SECTION      Social History   Tobacco Use  . Smoking status: Never Smoker  . Smokeless tobacco: Never Used  Substance Use Topics  . Alcohol use: No    Frequency: Never  . Drug use: No    Family History  Problem Relation Age of Onset  . Breast cancer Neg Hx   . Diabetes Brother   . Heart attack Brother   . Diabetes Brother   . Heart attack Brother   . Diabetes Brother     Medication list has been reviewed and updated.  Physical Examination: BP 111/78   Pulse 81   Resp 16   Ht 5' (1.524 m)   Wt 144 lb 8 oz (65.5 kg)   SpO2 99%   BMI 28.22 kg/m   Physical Exam  Constitutional: She appears well-developed and well-nourished.  Cardiovascular: Normal rate, regular rhythm and normal heart sounds.  Pulmonary/Chest: Effort normal and breath sounds normal.  Musculoskeletal: She exhibits no edema.  Neurological: She is alert.  Skin: Skin is warm and dry.  Psychiatric: She has a normal mood and affect. Her behavior is normal.  Nursing note and vitals reviewed.   Assessment and Plan:  1. Type 2 diabetes, uncontrolled, with nephropathy ([place]) Unclear control on metformin/Trulicity and increased Lantus, having some AM hypoglycemia but not interested in bid insulin regimen, saw optho in August, podiatry following - HgB A1c  2. CKD (chronic kidney disease) stage 3, GFR 30-59 ml/min (HCC) Stable last visit, avoiding NSAIDS - Vitamin D (25 hydroxy)  3. Microalbuminuria On increased losartan - Urine Microalbumin w/creat. ratio  4. Coronary artery disease involving native coronary artery of native heart without angina pectoris Stable on BB/ARB/statin/asa  5. Essential hypertension Well controlled on increased losartan  6. Hyperlipidemia, unspecified hyperlipidemia type Well controlled on Lipitor  7. Allergic  rhinitis, unspecified seasonality, unspecified trigger Worse off Singulair, restart daily  8. Psoriasis Improved on Enstilar, derm following  9. Insomnia, unspecified type Well controlled on trazodone, consider Remeron or SSRI if mood worsens  10. Healthcare maintenance - Hepatitis C Antibody - HIV antibody (with reflex)  11. Med review D/c vit C and B complex, begin women's over 50 multivitamin  Return in about 3 months (around 09/01/2017).  Satira Anis. [facility]  06/02/2017

## 2017-06-03 LAB — HIV ANTIBODY (ROUTINE TESTING W REFLEX): HIV Screen 4th Generation wRfx: NONREACTIVE

## 2017-06-03 LAB — HEPATITIS C ANTIBODY: HCV Ab: 0.2 s/co ratio (ref 0.0–0.9)

## 2017-06-03 LAB — MICROALBUMIN / CREATININE URINE RATIO
CREATININE, UR: 132 mg/dL
Microalb Creat Ratio: 61.9 mg/g creat — ABNORMAL HIGH (ref 0.0–30.0)
Microalb, Ur: 81.7 ug/mL — ABNORMAL HIGH

## 2017-06-03 LAB — VITAMIN D 25 HYDROXY (VIT D DEFICIENCY, FRACTURES): Vit D, 25-Hydroxy: 32.6 ng/mL (ref 30.0–100.0)

## 2017-06-04 ENCOUNTER — Other Ambulatory Visit: Payer: Self-pay | Admitting: Family Medicine

## 2017-06-04 MED ORDER — IRBESARTAN 300 MG PO TABS: 300.0000 mg | ORAL_TABLET | Freq: Every day | ORAL | 2 refills | Status: DC

## 2017-06-12 ENCOUNTER — Telehealth: Payer: Self-pay

## 2017-06-12 NOTE — Telephone Encounter (Signed)
Patient stopped to ask about the Rx I explained she is on Atorvastatina nd does not need Fenofib. She agrees. She did also say her BS runs 88 in mornings but 130-200 in PM. She said her machine will not keep a memory so I advised her to keep a log of fasting Am and reg PM BS readings and I scheduled her with Otilio Miu Jul 01 2017. She also reports having issue of hearing her heart beating and loud. She said she used to be on Metoprolol for pounding heart beat and she was asked to D/C. She does not have this all the time just now and then but wants to know should she go back on Metoprolol? Will be seen 5/7 [place].

## 2017-06-12 NOTE — Telephone Encounter (Signed)
I have that she is taking metoprolol XL 100 mg daily which was never stopped. Encourage her to bring her meds to next visit and discuss heart pounding with Dr. Ronnald Ramp.

## 2017-06-13 NOTE — Telephone Encounter (Signed)
Advised 

## 2017-06-20 ENCOUNTER — Encounter: Payer: Medicare Other | Attending: Family Medicine | Admitting: Dietician

## 2017-06-20 ENCOUNTER — Encounter: Payer: Self-pay | Admitting: Dietician

## 2017-06-20 VITALS — Ht 60.0 in | Wt 143.5 lb

## 2017-06-20 DIAGNOSIS — E1121 Type 2 diabetes mellitus with diabetic nephropathy: Secondary | ICD-10-CM | POA: Diagnosis not present

## 2017-06-20 DIAGNOSIS — Z6828 Body mass index (BMI) 28.0-28.9, adult: Secondary | ICD-10-CM | POA: Diagnosis not present

## 2017-06-20 DIAGNOSIS — Z794 Long term (current) use of insulin: Secondary | ICD-10-CM

## 2017-06-20 DIAGNOSIS — E1140 Type 2 diabetes mellitus with diabetic neuropathy, unspecified: Secondary | ICD-10-CM | POA: Insufficient documentation

## 2017-06-20 DIAGNOSIS — Z713 Dietary counseling and surveillance: Secondary | ICD-10-CM | POA: Diagnosis present

## 2017-06-20 NOTE — Progress Notes (Signed)
Medical Nutrition Therapy: Visit start time: 2505  end time: 1415  Assessment:  Diagnosis: Type 2 Diabetes Past medical history: HLD, CKD Stage III, lactose intolerance Psychosocial issues/ stress concerns: none Preferred learning method:  . Auditory . Hands-on  Current weight: 143  Height: 5'0" Medications, supplements: reconciled list in medical record  Progress and evaluation: Patient reports long history of diabetes, 2 brothers have passed away from diabetes complications. She has been working to control intake of carbs, and is eating mostly low-carb vegetables and lean proteins and soups. She does eat rice, couscous; limits fruits.   Physical activity: walking on treadmill 10-15 minutes, 5-6 days per week.  Dietary Intake:  Usual eating pattern includes 3 meals and 1-2 snacks per day. Dining out frequency: 0 meals per week.  Breakfast: 1 slice toast with salmon, banana; was eating oatmeal with almond milk Snack: none or < 1/2 apple Lunch: small portion rice + vegetables + beef/ chicken/ fish Snack: none or < 1/2 apple Supper: same as lunch   Snack: none unless supper is early or light, then might have 1 piece of bread Beverages: water 100oz or more daily, some green tea, rarely Dr. Malachi Bonds  Nutrition Care Education: Topics covered: diabetes Basic nutrition: basic food groups, appropriate nutrient balance, appropriate meal and snack schedule, general nutrition guidelines    Diabetes: appropriate meal and snack schedule, appropriate carb intake and balance/ consistent intake, protein options and importance of protein with each meal; role of exercise, sleep, stress management on BG control Other:  Importance of limiting sodium intake for renal health  Nutritional Diagnosis:  [place]-2.2 Altered nutrition-related laboratory As related to Type 2 diabetes.  As evidenced by patient recent HbA1C of 9.4%.  Intervention: Instruction as noted above.   Patient has made positive diet changes to  better control carb intake.   She is overall following a healthy eating pattern at this time.   Her primary goal is to increase her daily exercise.   RD follow-up not needed at this time; patient to schedule later if needed.   Education Materials given:  . General diet guidelines for Diabetes . Plate Planner with food lists . Sample menus . Goals/ instructions  Learner/ who was taught:  . Patient   Level of understanding: Marland Kitchen Verbalizes/ demonstrates competency  Demonstrated degree of understanding via:   Teach back Learning barriers: . None  Willingness to learn/ readiness for change: . Eager, change in progress  Monitoring and Evaluation:  Dietary intake, exercise, BG control, and body weight      follow up: prn

## 2017-06-20 NOTE — Patient Instructions (Signed)
   Continue to increase exercise. Eventually the goal is to exercise 30 minutes about 5 times a week.  Control portions of starchy foods and other carbs to the size of a fisted hand or less for good blood sugar control.  Great job making healthy food choices!

## 2017-07-01 ENCOUNTER — Encounter: Payer: Self-pay | Admitting: Family Medicine

## 2017-07-01 ENCOUNTER — Ambulatory Visit (INDEPENDENT_AMBULATORY_CARE_PROVIDER_SITE_OTHER): Payer: Medicare Other | Admitting: Family Medicine

## 2017-07-01 VITALS — BP 120/62 | HR 64 | Ht 60.0 in | Wt 141.0 lb

## 2017-07-01 DIAGNOSIS — I2510 Atherosclerotic heart disease of native coronary artery without angina pectoris: Secondary | ICD-10-CM

## 2017-07-01 DIAGNOSIS — G4700 Insomnia, unspecified: Secondary | ICD-10-CM | POA: Diagnosis not present

## 2017-07-01 DIAGNOSIS — E1165 Type 2 diabetes mellitus with hyperglycemia: Secondary | ICD-10-CM | POA: Diagnosis not present

## 2017-07-01 DIAGNOSIS — R809 Proteinuria, unspecified: Secondary | ICD-10-CM

## 2017-07-01 DIAGNOSIS — I10 Essential (primary) hypertension: Secondary | ICD-10-CM | POA: Diagnosis not present

## 2017-07-01 DIAGNOSIS — K219 Gastro-esophageal reflux disease without esophagitis: Secondary | ICD-10-CM

## 2017-07-01 DIAGNOSIS — E1121 Type 2 diabetes mellitus with diabetic nephropathy: Secondary | ICD-10-CM | POA: Diagnosis not present

## 2017-07-01 DIAGNOSIS — N183 Chronic kidney disease, stage 3 unspecified: Secondary | ICD-10-CM

## 2017-07-01 DIAGNOSIS — J309 Allergic rhinitis, unspecified: Secondary | ICD-10-CM

## 2017-07-01 DIAGNOSIS — E785 Hyperlipidemia, unspecified: Secondary | ICD-10-CM | POA: Diagnosis not present

## 2017-07-01 DIAGNOSIS — E1140 Type 2 diabetes mellitus with diabetic neuropathy, unspecified: Secondary | ICD-10-CM

## 2017-07-01 DIAGNOSIS — IMO0002 Reserved for concepts with insufficient information to code with codable children: Secondary | ICD-10-CM

## 2017-07-01 MED ORDER — ATORVASTATIN CALCIUM 80 MG PO TABS: 80.0000 mg | ORAL_TABLET | Freq: Every day | ORAL | 1 refills | Status: DC

## 2017-07-01 MED ORDER — ACCU-CHEK FASTCLIX LANCETS MISC: 12 refills | Status: DC

## 2017-07-01 MED ORDER — SYRINGE (DISPOSABLE) 30 ML MISC: 12 refills | Status: DC

## 2017-07-01 MED ORDER — INSULIN GLARGINE 100 UNIT/ML ~~LOC~~ SOLN: 40.0000 [IU] | Freq: Every day | SUBCUTANEOUS | Status: DC

## 2017-07-01 MED ORDER — ASPIRIN EC 81 MG PO TBEC: 81.0000 mg | DELAYED_RELEASE_TABLET | Freq: Every day | ORAL | 3 refills | Status: AC

## 2017-07-01 MED ORDER — METFORMIN HCL 1000 MG PO TABS: 1000.0000 mg | ORAL_TABLET | Freq: Two times a day (BID) | ORAL | 1 refills | Status: DC

## 2017-07-01 MED ORDER — GLUCOSE BLOOD VI STRP: ORAL_STRIP | 12 refills | Status: DC

## 2017-07-01 MED ORDER — TRAZODONE HCL 50 MG PO TABS: 50.0000 mg | ORAL_TABLET | Freq: Every day | ORAL | 1 refills | Status: DC

## 2017-07-01 MED ORDER — METOPROLOL TARTRATE 50 MG PO TABS: 50.0000 mg | ORAL_TABLET | Freq: Two times a day (BID) | ORAL | 1 refills | Status: DC

## 2017-07-01 MED ORDER — OMEPRAZOLE 20 MG PO CPDR: 20.0000 mg | DELAYED_RELEASE_CAPSULE | Freq: Every day | ORAL | 1 refills | Status: DC

## 2017-07-01 MED ORDER — MONTELUKAST SODIUM 10 MG PO TABS: 10.0000 mg | ORAL_TABLET | Freq: Every day | ORAL | 1 refills | Status: DC

## 2017-07-01 MED ORDER — DULAGLUTIDE 0.75 MG/0.5ML ~~LOC~~ SOAJ: 0.7500 mg | SUBCUTANEOUS | 1 refills | Status: DC

## 2017-07-01 MED ORDER — IRBESARTAN 300 MG PO TABS: 300.0000 mg | ORAL_TABLET | Freq: Every day | ORAL | 1 refills | Status: DC

## 2017-07-01 NOTE — Progress Notes (Signed)
Name: Elizabeth Klein   MRN: [id number]    DOB: April 26, 1950   Date:07/01/2017       Progress Note  Subjective  Chief Complaint  Chief Complaint  Patient presents with  . Hypertension    Dr Vicente Masson changed her to metoprolol 100mg  from 50mg  bid- she wants to go back on metoprolol 50mg  bID  . Diabetes    dropped down to 40 units on Lantus    Hypertension  This is a chronic problem. The current episode started more than 1 year ago. The problem is unchanged. The problem is controlled. Pertinent negatives include no anxiety, blurred vision, chest pain, headaches, malaise/fatigue, neck pain, orthopnea, palpitations, peripheral edema, PND, shortness of breath or sweats. There are no associated agents to hypertension. Risk factors for coronary artery disease include diabetes mellitus, dyslipidemia and post-menopausal state. Past treatments include beta blockers and angiotensin blockers. The current treatment provides moderate improvement. There are no compliance problems.  There is no history of angina, kidney disease, CAD/MI, CVA, heart failure, left ventricular hypertrophy, PVD or retinopathy. There is no history of chronic renal disease, a hypertension causing med or renovascular disease.  Diabetes  She presents for her follow-up diabetic visit. She has type 2 diabetes mellitus. Her disease course has been stable. Hypoglycemia symptoms include nervousness/anxiousness. Pertinent negatives for hypoglycemia include no confusion, dizziness, headaches, sleepiness or sweats. Pertinent negatives for diabetes include no blurred vision, no chest pain, no polydipsia and no weight loss. Symptoms are stable. Pertinent negatives for diabetic complications include no CVA, PVD or retinopathy. Current diabetic treatment includes insulin injections (trulicity). She is compliant with treatment all of the time. She is following a generally healthy diet. Meal planning includes avoidance of concentrated sweets. Her breakfast  blood glucose is taken between 8-9 am. Her breakfast blood glucose range is generally 90-110 mg/dl. An ACE inhibitor/angiotensin II receptor blocker is being taken. Eye exam is current.  Heart Problem  This is a chronic (hx of 2 partially blockage) problem. The problem has been unchanged. Pertinent negatives include no abdominal pain, chest pain, chills, coughing, fever, headaches, myalgias, nausea, neck pain, rash or sore throat.  Insomnia  Primary symptoms: difficulty falling asleep, no malaise/fatigue.  The current episode started more than one year. The problem has been gradually improving since onset. The treatment provided moderate relief. PMH includes: hypertension, no depression.    No problem-specific Assessment & Plan notes found for this encounter.   Past Medical History:  Diagnosis Date  . Acid reflux   . Chest pain   . Diabetes mellitus without complication ([name])   . High cholesterol   . Hypertension   . Kidney stones   . Migraines   . Psoriasis   . Rapid heart rate   . Weight loss     Past Surgical History:  Procedure Laterality Date  . BREAST BIOPSY Right    neg  . CESAREAN SECTION      Family History  Problem Relation Age of Onset  . Breast cancer Neg Hx   . Diabetes Brother   . Heart attack Brother   . Diabetes Brother   . Heart attack Brother   . Diabetes Brother     Social History   Socioeconomic History  . Marital status: Married    Spouse name: Not on file  . Number of children: Not on file  . Years of education: Not on file  . Highest education level: Not on file  Occupational History  . Not on file  Social Needs  . Financial resource strain: Not hard at all  . Food insecurity:    Worry: Never true    Inability: Never true  . Transportation needs:    Medical: No    Non-medical: No  Tobacco Use  . Smoking status: Never Smoker  . Smokeless tobacco: Never Used  Substance and Sexual Activity  . Alcohol use: No    Frequency: Never  .  Drug use: No  . Sexual activity: Not on file  Lifestyle  . Physical activity:    Days per week: 0 days    Minutes per session: 0 min  . Stress: To some extent  Relationships  . Social connections:    Talks on phone: More than three times a week    Gets together: Twice a week    Attends religious service: 1 to 4 times per year    Active member of club or organization: Yes    Attends meetings of clubs or organizations: Never    Relationship status: Married  . Intimate partner violence:    Fear of current or ex partner: Patient refused    Emotionally abused: Patient refused    Physically abused: Patient refused    Forced sexual activity: Patient refused  Other Topics Concern  . Not on file  Social History Narrative   ** Merged History Encounter **        No Known Allergies  Outpatient Medications Prior to Visit  Medication Sig Dispense Refill  . acetaminophen (TYLENOL) 500 MG tablet Take 1,000 mg by mouth every 8 (eight) hours as needed.    . Calcipotriene-Betameth Diprop (ENSTILAR) 0.005-0.064 % FOAM Apply 1 application topically 2 (two) times daily.    . fluocinonide ointment (LIDEX) 4.09 % Apply 1 application topically 2 (two) times daily. 30 g 2  . ACCU-CHEK FASTCLIX LANCETS MISC Use with accucheck machine to check BS up to 3 times daily for DM2 ICD10 E11.9 100 each 12  . aspirin EC 81 MG tablet Take 81 mg daily by mouth.    Marland Kitchen atorvastatin (LIPITOR) 80 MG tablet Take 80 mg daily by mouth.    . Dulaglutide (TRULICITY) 8.11 BJ/4.7WG SOPN Inject 0.75 mg into the skin once a week.    Marland Kitchen glucose blood test strip Use to check BS with Accucheck Meter up to 3 times daily for DM2 E11.9 100 each 12  . insulin glargine (LANTUS) 100 UNIT/ML injection Inject 0.5 mLs (50 Units total) into the skin at bedtime. (Patient taking differently: Inject 40 Units into the skin at bedtime. ) 10 mL 2  . irbesartan (AVAPRO) 300 MG tablet Take 1 tablet (300 mg total) by mouth daily. 30 tablet 2  .  metFORMIN (GLUCOPHAGE) 1000 MG tablet Take 1,000 mg 2 (two) times daily with a meal by mouth.    . metoprolol succinate (TOPROL-XL) 100 MG 24 hr tablet Take 1 tablet (100 mg total) by mouth daily. Take with or immediately following a meal. 30 tablet 2  . montelukast (SINGULAIR) 10 MG tablet Take 10 mg by mouth daily.    Marland Kitchen omeprazole (PRILOSEC) 20 MG capsule Take 20 mg by mouth daily.    . Syringe, Disposable, (B-D 30CC SYRINGE) 30 ML MISC Use with Lantus Injections for DM2 ICD 10 E11.9 50 each 12  . traZODone (DESYREL) 50 MG tablet Take 50 mg at bedtime by mouth.    . losartan (COZAAR) 100 MG tablet Take 100 mg by mouth daily.    . Multiple Vitamins-Minerals (MULTIVITAMIN  WOMEN 50+) TABS Take 1 tablet by mouth daily.     No facility-administered medications prior to visit.     Review of Systems  Constitutional: Negative for chills, fever, malaise/fatigue and weight loss.  HENT: Negative for ear discharge, ear pain and sore throat.   Eyes: Negative for blurred vision.  Respiratory: Negative for cough, sputum production, shortness of breath and wheezing.   Cardiovascular: Negative for chest pain, palpitations, orthopnea, leg swelling and PND.  Gastrointestinal: Negative for abdominal pain, blood in stool, constipation, diarrhea, heartburn, melena and nausea.  Genitourinary: Negative for dysuria, frequency, hematuria and urgency.  Musculoskeletal: Negative for back pain, joint pain, myalgias and neck pain.  Skin: Negative for rash.  Neurological: Negative for dizziness, tingling, sensory change, focal weakness and headaches.  Endo/Heme/Allergies: Negative for environmental allergies and polydipsia. Does not bruise/bleed easily.  Psychiatric/Behavioral: Negative for confusion, depression and suicidal ideas. The patient is nervous/anxious and has insomnia.      Objective  Vitals:   07/01/17 1404  BP: 120/62  Pulse: 64  Weight: 141 lb (64 kg)  Height: 5' (1.524 m)    Physical Exam   Constitutional: She is oriented to person, place, and time. She appears well-developed and well-nourished.  HENT:  Head: Normocephalic.  Right Ear: External ear normal.  Left Ear: External ear normal.  Mouth/Throat: Oropharynx is clear and moist.  Eyes: Pupils are equal, round, and reactive to light. Conjunctivae and EOM are normal. Lids are everted and swept, no foreign bodies found. Left eye exhibits no hordeolum. No foreign body present in the left eye. Right conjunctiva is not injected. Left conjunctiva is not injected. No scleral icterus.  Neck: Normal range of motion. Neck supple. No JVD present. No tracheal deviation present. No thyromegaly present.  Cardiovascular: Normal rate, regular rhythm, normal heart sounds and intact distal pulses. Exam reveals no gallop and no friction rub.  No murmur heard. Pulmonary/Chest: Effort normal and breath sounds normal. No respiratory distress. She has no wheezes. She has no rales.  Abdominal: Soft. Bowel sounds are normal. She exhibits no mass. There is no hepatosplenomegaly. There is no tenderness. There is no rebound and no guarding.  Musculoskeletal: Normal range of motion. She exhibits no edema or tenderness.  Lymphadenopathy:    She has no cervical adenopathy.  Neurological: She is alert and oriented to person, place, and time. She has normal strength. She displays normal reflexes. No cranial nerve deficit.  Skin: Skin is warm. No rash noted.  Psychiatric: She has a normal mood and affect. Her mood appears not anxious. She does not exhibit a depressed mood.  Nursing note and vitals reviewed.     Assessment & Plan  Problem List Items Addressed This Visit      Cardiovascular and Mediastinum   Hypertension   Relevant Medications   irbesartan (AVAPRO) 300 MG tablet   atorvastatin (LIPITOR) 80 MG tablet   aspirin EC 81 MG tablet   metoprolol tartrate (LOPRESSOR) 50 MG tablet   Other Relevant Orders   Renal Function Panel   CAD  (coronary artery disease)   Relevant Medications   irbesartan (AVAPRO) 300 MG tablet   atorvastatin (LIPITOR) 80 MG tablet   aspirin EC 81 MG tablet   metoprolol tartrate (LOPRESSOR) 50 MG tablet     Respiratory   Allergic rhinitis   Relevant Medications   montelukast (SINGULAIR) 10 MG tablet     Endocrine   Uncontrolled type 2 diabetes mellitus with diabetic nephropathy ([place]) - Primary  Relevant Medications   Syringe, Disposable, (B-D 30CC SYRINGE) 30 ML MISC   glucose blood test strip   ACCU-CHEK FASTCLIX LANCETS MISC   Dulaglutide (TRULICITY) 1.61 WR/6.0AV SOPN   metFORMIN (GLUCOPHAGE) 1000 MG tablet   irbesartan (AVAPRO) 300 MG tablet   insulin glargine (LANTUS) 100 UNIT/ML injection   atorvastatin (LIPITOR) 80 MG tablet   aspirin EC 81 MG tablet   Other Relevant Orders   Renal Function Panel   Hemoglobin A1c     Genitourinary   CKD (chronic kidney disease) stage 3, GFR 30-59 ml/min (HCC)     Other   Hyperlipidemia   Relevant Medications   irbesartan (AVAPRO) 300 MG tablet   atorvastatin (LIPITOR) 80 MG tablet   aspirin EC 81 MG tablet   metoprolol tartrate (LOPRESSOR) 50 MG tablet   Other Relevant Orders   Lipid panel   Insomnia   Relevant Medications   traZODone (DESYREL) 50 MG tablet   Microalbuminuria   Relevant Medications   irbesartan (AVAPRO) 300 MG tablet    Other Visit Diagnoses    Type 2 diabetes, controlled, with neuropathy (HCC)       Relevant Medications   Syringe, Disposable, (B-D 30CC SYRINGE) 30 ML MISC   glucose blood test strip   ACCU-CHEK FASTCLIX LANCETS MISC   Dulaglutide (TRULICITY) 4.09 WJ/1.9JY SOPN   metFORMIN (GLUCOPHAGE) 1000 MG tablet   irbesartan (AVAPRO) 300 MG tablet   insulin glargine (LANTUS) 100 UNIT/ML injection   atorvastatin (LIPITOR) 80 MG tablet   aspirin EC 81 MG tablet   Gastroesophageal reflux disease, esophagitis presence not specified       Relevant Medications   omeprazole (PRILOSEC) 20 MG capsule       Meds ordered this encounter  Medications  . Syringe, Disposable, (B-D 30CC SYRINGE) 30 ML MISC    Sig: Use with Lantus Injections for DM2 ICD 10 E11.9    Dispense:  50 each    Refill:  12  . glucose blood test strip    Sig: Use to check BS with Accucheck Meter up to 3 times daily for DM2 E11.9    Dispense:  100 each    Refill:  12  . ACCU-CHEK FASTCLIX LANCETS MISC    Sig: Use with accucheck machine to check BS up to 3 times daily for DM2 ICD10 E11.9    Dispense:  100 each    Refill:  12  . Dulaglutide (TRULICITY) 7.82 NF/6.2ZH SOPN    Sig: Inject 0.75 mg into the skin once a week.    Dispense:  13 pen    Refill:  1  . metFORMIN (GLUCOPHAGE) 1000 MG tablet    Sig: Take 1 tablet (1,000 mg total) by mouth 2 (two) times daily with a meal.    Dispense:  180 tablet    Refill:  1  . montelukast (SINGULAIR) 10 MG tablet    Sig: Take 1 tablet (10 mg total) by mouth daily.    Dispense:  90 tablet    Refill:  1  . omeprazole (PRILOSEC) 20 MG capsule    Sig: Take 1 capsule (20 mg total) by mouth daily.    Dispense:  90 capsule    Refill:  1  . irbesartan (AVAPRO) 300 MG tablet    Sig: Take 1 tablet (300 mg total) by mouth daily.    Dispense:  90 tablet    Refill:  1  . traZODone (DESYREL) 50 MG tablet    Sig: Take 1 tablet (50  mg total) by mouth at bedtime.    Dispense:  90 tablet    Refill:  1  . insulin glargine (LANTUS) 100 UNIT/ML injection    Sig: Inject 0.4 mLs (40 Units total) into the skin at bedtime.  Marland Kitchen atorvastatin (LIPITOR) 80 MG tablet    Sig: Take 1 tablet (80 mg total) by mouth daily.    Dispense:  90 tablet    Refill:  1  . aspirin EC 81 MG tablet    Sig: Take 1 tablet (81 mg total) by mouth daily.    Dispense:  90 tablet    Refill:  3  . metoprolol tartrate (LOPRESSOR) 50 MG tablet    Sig: Take 1 tablet (50 mg total) by mouth 2 (two) times daily.    Dispense:  180 tablet    Refill:  1      Dr. Otilio Miu [facility]  07/01/17

## 2017-07-02 ENCOUNTER — Other Ambulatory Visit: Payer: Self-pay

## 2017-07-02 DIAGNOSIS — E1065 Type 1 diabetes mellitus with hyperglycemia: Secondary | ICD-10-CM

## 2017-07-02 LAB — HEMOGLOBIN A1C
Est. average glucose Bld gHb Est-mCnc: 223 mg/dL
HEMOGLOBIN A1C: 9.4 % — AB (ref 4.8–5.6)

## 2017-07-02 LAB — LIPID PANEL
CHOL/HDL RATIO: 3.1 ratio (ref 0.0–4.4)
Cholesterol, Total: 135 mg/dL (ref 100–199)
HDL: 43 mg/dL (ref >39)
LDL Calculated: 60 mg/dL (ref 0–99)
Triglycerides: 159 mg/dL — ABNORMAL HIGH (ref 0–149)
VLDL Cholesterol Cal: 32 mg/dL (ref 5–40)

## 2017-07-02 LAB — RENAL FUNCTION PANEL
Albumin: 4.3 g/dL (ref 3.6–4.8)
BUN/Creatinine Ratio: 23 (ref 12–28)
BUN: 27 mg/dL (ref 8–27)
CO2: 18 mmol/L — ABNORMAL LOW (ref 20–29)
Calcium: 10 mg/dL (ref 8.7–10.3)
Chloride: 105 mmol/L (ref 96–106)
Creatinine, Ser: 1.16 mg/dL — ABNORMAL HIGH (ref 0.57–1.00)
GFR, EST AFRICAN AMERICAN: 57 mL/min/{1.73_m2} — AB (ref >59)
GFR, EST NON AFRICAN AMERICAN: 50 mL/min/{1.73_m2} — AB (ref >59)
GLUCOSE: 146 mg/dL — AB (ref 65–99)
Phosphorus: 4.1 mg/dL (ref 2.5–4.5)
Potassium: 5.5 mmol/L — ABNORMAL HIGH (ref 3.5–5.2)
SODIUM: 138 mmol/L (ref 134–144)

## 2017-07-04 ENCOUNTER — Other Ambulatory Visit: Payer: Self-pay

## 2017-07-25 ENCOUNTER — Other Ambulatory Visit: Payer: Self-pay

## 2017-08-01 ENCOUNTER — Other Ambulatory Visit: Payer: Self-pay | Admitting: Family Medicine

## 2017-08-04 ENCOUNTER — Encounter: Admit: 2017-08-04 | Discharge: 2017-08-06 | Disposition: A | Discharge status: Home / Self Care | Payer: MEDICARE

## 2017-08-05 MED ORDER — FLUOCINONIDE 0.05 % EX SOLN
CUTANEOUS | Status: DC
Start: ? — End: 2017-08-05

## 2017-08-05 MED ORDER — METFORMIN HCL 500 MG PO TABS
1000.00 | ORAL_TABLET | ORAL | Status: DC
Start: 2017-08-05 — End: 2017-08-05

## 2017-08-05 MED ORDER — VALSARTAN 160 MG PO TABS
160.00 | ORAL_TABLET | ORAL | Status: DC
Start: 2017-08-05 — End: 2017-08-05

## 2017-08-05 MED ORDER — GRX ANALGESIC BALM EX OINT
1.00 | TOPICAL_OINTMENT | CUTANEOUS | Status: DC
Start: ? — End: 2017-08-05

## 2017-08-05 MED ORDER — INSULIN GLARGINE 100 UNIT/ML ~~LOC~~ SOLN
35.00 | SUBCUTANEOUS | Status: DC
Start: 2017-08-05 — End: 2017-08-05

## 2017-08-05 MED ORDER — ATORVASTATIN CALCIUM 80 MG PO TABS
80.00 | ORAL_TABLET | ORAL | Status: DC
Start: 2017-08-05 — End: 2017-08-05

## 2017-08-05 MED ORDER — TRAZODONE HCL 50 MG PO TABS
50.00 | ORAL_TABLET | ORAL | Status: DC
Start: ? — End: 2017-08-05

## 2017-08-05 MED ORDER — ALBUTEROL SULFATE HFA 108 (90 BASE) MCG/ACT IN AERS
2.00 | INHALATION_SPRAY | RESPIRATORY_TRACT | Status: DC
Start: ? — End: 2017-08-05

## 2017-08-05 MED ORDER — MONTELUKAST SODIUM 10 MG PO TABS
10.00 | ORAL_TABLET | ORAL | Status: DC
Start: 2017-08-06 — End: 2017-08-05

## 2017-08-05 MED ORDER — ASPIRIN 81 MG PO CHEW
81.00 | CHEWABLE_TABLET | ORAL | Status: DC
Start: 2017-08-06 — End: 2017-08-05

## 2017-08-05 MED ORDER — METOPROLOL TARTRATE 50 MG PO TABS
50.00 | ORAL_TABLET | ORAL | Status: DC
Start: 2017-08-05 — End: 2017-08-05

## 2017-08-12 ENCOUNTER — Ambulatory Visit
Admission: RE | Admit: 2017-08-12 | Discharge: 2017-08-12 | Disposition: A | Discharge status: Home / Self Care | Payer: Medicare Other | Source: Ambulatory Visit | Attending: Internal Medicine | Admitting: Internal Medicine

## 2017-08-12 ENCOUNTER — Ambulatory Visit (INDEPENDENT_AMBULATORY_CARE_PROVIDER_SITE_OTHER): Payer: Medicare Other | Admitting: Family Medicine

## 2017-08-12 ENCOUNTER — Ambulatory Visit
Admission: RE | Admit: 2017-08-12 | Discharge: 2017-08-12 | Disposition: A | Discharge status: Home / Self Care | Payer: Medicare Other | Source: Ambulatory Visit | Attending: Family Medicine | Admitting: Family Medicine

## 2017-08-12 ENCOUNTER — Encounter: Payer: Self-pay | Admitting: Family Medicine

## 2017-08-12 VITALS — BP 120/80 | HR 68 | Ht 60.0 in | Wt 139.0 lb

## 2017-08-12 DIAGNOSIS — E1165 Type 2 diabetes mellitus with hyperglycemia: Secondary | ICD-10-CM | POA: Diagnosis not present

## 2017-08-12 DIAGNOSIS — IMO0002 Reserved for concepts with insufficient information to code with codable children: Secondary | ICD-10-CM

## 2017-08-12 DIAGNOSIS — R29898 Other symptoms and signs involving the musculoskeletal system: Secondary | ICD-10-CM | POA: Diagnosis not present

## 2017-08-12 DIAGNOSIS — M5136 Other intervertebral disc degeneration, lumbar region: Secondary | ICD-10-CM | POA: Diagnosis not present

## 2017-08-12 DIAGNOSIS — E1121 Type 2 diabetes mellitus with diabetic nephropathy: Secondary | ICD-10-CM

## 2017-08-12 DIAGNOSIS — R200 Anesthesia of skin: Secondary | ICD-10-CM | POA: Diagnosis not present

## 2017-08-12 DIAGNOSIS — M51369 Other intervertebral disc degeneration, lumbar region without mention of lumbar back pain or lower extremity pain: Secondary | ICD-10-CM

## 2017-08-12 DIAGNOSIS — W19XXXA Unspecified fall, initial encounter: Secondary | ICD-10-CM

## 2017-08-12 DIAGNOSIS — E162 Hypoglycemia, unspecified: Secondary | ICD-10-CM | POA: Diagnosis not present

## 2017-08-12 DIAGNOSIS — M4316 Spondylolisthesis, lumbar region: Secondary | ICD-10-CM | POA: Diagnosis not present

## 2017-08-12 NOTE — Progress Notes (Signed)
Name: Elizabeth Klein   MRN: [id number]    DOB: 12-Nov-1950   Date:08/12/2017       Progress Note  Subjective  Chief Complaint  Chief Complaint  Patient presents with  . Extremity Weakness    L) leg feels numb    Extremity Weakness   This is a new problem. The current episode started 1 to 4 weeks ago. The problem has been waxing and waning. Associated symptoms include numbness and tingling. Pertinent negatives include no fever, inability to bear weight, itching, joint locking, joint swelling, limited range of motion or stiffness. The treatment provided moderate relief.  Fall  The accident occurred more than 1 week ago (1 month ). The fall occurred while walking. She landed on carpet. Point of impact: fell on hands and knees. The patient is experiencing no pain. Associated symptoms include headaches, numbness and tingling. Pertinent negatives include no bowel incontinence, fever or visual change. Associated symptoms comments: Left leg. The treatment provided no relief.  Neurologic Problem  The patient's primary symptoms include focal sensory loss and focal weakness. The patient's pertinent negatives include no altered mental status, clumsiness, loss of balance, memory loss, near-syncope, slurred speech, syncope or visual change. Primary symptoms comment: left arm and leg. This is a new problem. The neurological problem developed suddenly. There was left-sided focality noted. Associated symptoms include fatigue and headaches. Pertinent negatives include no auditory change, bowel incontinence, dizziness or fever. (Right sided headache) Past treatments include acetaminophen.  Diabetes  She presents for her follow-up diabetic visit. She has type 2 diabetes mellitus. Her disease course has been fluctuating. Hypoglycemia symptoms include headaches. Pertinent negatives for hypoglycemia include no dizziness. Associated symptoms include fatigue. Pertinent negatives for diabetes include no visual change.  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 Patient is on Lantis/glucophage and trulicity. Has been referred to endocrine to further stabilize diabetes.   Past Medical History:  Diagnosis Date  . Acid reflux   . Chest pain   . Diabetes mellitus without complication ([place])   . High cholesterol   . Hypertension   . Kidney stones   . Migraines   . Psoriasis   . Rapid heart rate   . Weight loss     Past Surgical History:  Procedure Laterality Date  . BREAST BIOPSY Right    neg  . CESAREAN SECTION      Family History  Problem Relation Age of Onset  . Breast cancer Neg Hx   . Diabetes Brother   . Heart attack Brother   . Diabetes Brother   . Heart attack Brother   . Diabetes Brother     Social History   Socioeconomic History  . Marital status: Married    Spouse name: Not on file  . Number of children: Not on file  . Years of education: Not on file  . Highest education level: Not on file  Occupational History  . Not on file  Social Needs  . Financial resource strain: Not hard at all  . Food insecurity:    Worry: Never true    Inability: Never true  . Transportation needs:    Medical: No    Non-medical: No  Tobacco Use  . Smoking status: Never Smoker  . Smokeless tobacco: Never Used  Substance and Sexual Activity  . Alcohol use: No    Frequency: Never  . Drug use: No  . Sexual  activity: Not on file  Lifestyle  . Physical activity:    Days per week: 0 days    Minutes per session: 0 min  . Stress: To some extent  Relationships  . Social connections:    Talks on phone: More than three times a week    Gets together: Twice a week    Attends religious service: 1 to 4 times per year    Active member of club or organization: Yes    Attends meetings of clubs or organizations: Never    Relationship status: Married  . Intimate partner violence:    Fear of  current or ex partner: Patient refused    Emotionally abused: Patient refused    Physically abused: Patient refused    Forced sexual activity: Patient refused  Other Topics Concern  . Not on file  Social History Narrative   ** Merged History Encounter **        No Known Allergies  Outpatient Medications Prior to Visit  Medication Sig Dispense Refill  . ACCU-CHEK FASTCLIX LANCETS MISC Use with accucheck machine to check BS up to 3 times daily for DM2 ICD10 E11.9 100 each 12  . aspirin EC 81 MG tablet Take 1 tablet (81 mg total) by mouth daily. 90 tablet 3  . atorvastatin (LIPITOR) 80 MG tablet Take 1 tablet (80 mg total) by mouth daily. 90 tablet 1  . Dulaglutide (TRULICITY) 3.22 GU/5.4YH SOPN Inject 0.75 mg into the skin once a week. 13 pen 1  . glucose blood test strip Use to check BS with Accucheck Meter up to 3 times daily for DM2 E11.9 100 each 12  . insulin glargine (LANTUS) 100 UNIT/ML injection Inject 0.4 mLs (40 Units total) into the skin at bedtime.    . irbesartan (AVAPRO) 300 MG tablet Take 1 tablet (300 mg total) by mouth daily. 90 tablet 1  . metFORMIN (GLUCOPHAGE) 1000 MG tablet Take 1 tablet (1,000 mg total) by mouth 2 (two) times daily with a meal. 180 tablet 1  . metoprolol tartrate (LOPRESSOR) 50 MG tablet Take 1 tablet (50 mg total) by mouth 2 (two) times daily. 180 tablet 1  . montelukast (SINGULAIR) 10 MG tablet Take 1 tablet (10 mg total) by mouth daily. 90 tablet 1  . omeprazole (PRILOSEC) 20 MG capsule Take 1 capsule (20 mg total) by mouth daily. 90 capsule 1  . Syringe, Disposable, (B-D 30CC SYRINGE) 30 ML MISC Use with Lantus Injections for DM2 ICD 10 E11.9 50 each 12  . traZODone (DESYREL) 50 MG tablet Take 1 tablet (50 mg total) by mouth at bedtime. 90 tablet 1  . acetaminophen (TYLENOL) 500 MG tablet Take 1,000 mg by mouth every 8 (eight) hours as needed.    . Calcipotriene-Betameth Diprop (ENSTILAR) 0.005-0.064 % FOAM Apply 1 application topically 2 (two)  times daily.    . fluocinonide ointment (LIDEX) 0.62 % Apply 1 application topically 2 (two) times daily. (Patient not taking: Reported on 08/12/2017) 30 g 2  . losartan (COZAAR) 100 MG tablet TAKE 1 TABLET BY MOUTH EVERY DAY 30 tablet 2   No facility-administered medications prior to visit.     Review of Systems  Constitutional: Positive for fatigue. Negative for fever.  Cardiovascular: Negative for near-syncope.  Gastrointestinal: Negative for bowel incontinence.  Musculoskeletal: Positive for extremity weakness. Negative for stiffness.  Skin: Negative for itching.  Neurological: Positive for tingling, focal weakness, numbness and headaches. Negative for dizziness, syncope and loss of balance.  Psychiatric/Behavioral: Negative for  memory loss.     Objective  Vitals:   08/12/17 1351  BP: 120/80  Pulse: 68  Weight: 139 lb (63 kg)  Height: 5' (1.524 m)    Physical Exam  Constitutional: No distress.  HENT:  Head: Normocephalic and atraumatic.  Right Ear: External ear normal.  Left Ear: External ear normal.  Nose: Nose normal.  Mouth/Throat: Oropharynx is clear and moist.  Eyes: Pupils are equal, round, and reactive to light. Conjunctivae and EOM are normal. Right eye exhibits no discharge. Left eye exhibits no discharge.  Neck: Normal range of motion. Neck supple. No JVD present. No thyromegaly present.  Cardiovascular: Normal rate, regular rhythm, normal heart sounds and intact distal pulses. Exam reveals no gallop and no friction rub.  No murmur heard. Pulmonary/Chest: Effort normal and breath sounds normal.  Abdominal: Soft. Bowel sounds are normal. She exhibits no mass. There is no tenderness. There is no guarding.  Musculoskeletal: Normal range of motion. She exhibits no edema.  Lymphadenopathy:    She has no cervical adenopathy.  Neurological: She is alert. She has normal strength and normal reflexes. A sensory deficit is present. No cranial nerve deficit.  Skin: Skin  is warm and dry. She is not diaphoretic.      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 Patient is on Lantis/glucophage and trulicity. Has been referred to endocrine to further stabilize diabetes.       Other Visit Diagnoses    Left leg weakness    -  Primary   Past several weeks   Relevant Orders   CT Head Wo Contrast   Left arm weakness       Past several weeks   Relevant Orders   CT Head Wo Contrast   Left arm numbness       past several weeks   Relevant Orders   CT Head Wo Contrast   Left leg numbness       past several weeks/ will do ct head to evaluate for cerebral vascular disease   Relevant Orders   CT Head Wo Contrast   Fall, initial encounter       Fell in kitchen last week with left leg pain,numbness,parestheia, and perceived weakness   Relevant Orders   DG Lumbar Spine Complete (Completed)   DDD (degenerative disc disease), lumbar       Previous history of lumbar pain with radicular symptoms to left leg.Seems to have been reactivated since fall.will check L/S spine .   Relevant Orders   DG Lumbar Spine Complete (Completed)   Hypoglycemia       Patient has multiple episodes that she relates hypoglycemic ? confirmed      No orders of the defined types were placed in this encounter. I spent 45 minutes with this patient, More than 50% of that time was spent in face to face education, counseling and care coordination.    Dr. [facility]  08/12/17

## 2017-08-12 NOTE — Assessment & Plan Note (Signed)
Last 2 A1C in 9 to 10 range. Patient is on Lantis/glucophage and trulicity. Has been referred to endocrine to further stabilize diabetes.

## 2017-08-18 ENCOUNTER — Ambulatory Visit
Admission: RE | Admit: 2017-08-18 | Discharge: 2017-08-18 | Disposition: A | Discharge status: Home / Self Care | Payer: Medicare Other | Source: Ambulatory Visit | Attending: Family Medicine | Admitting: Family Medicine

## 2017-08-18 DIAGNOSIS — R200 Anesthesia of skin: Secondary | ICD-10-CM

## 2017-08-18 DIAGNOSIS — R29898 Other symptoms and signs involving the musculoskeletal system: Secondary | ICD-10-CM

## 2017-08-18 DIAGNOSIS — G319 Degenerative disease of nervous system, unspecified: Secondary | ICD-10-CM | POA: Insufficient documentation

## 2017-08-18 DIAGNOSIS — R918 Other nonspecific abnormal finding of lung field: Secondary | ICD-10-CM | POA: Diagnosis not present

## 2017-08-22 ENCOUNTER — Other Ambulatory Visit: Payer: Self-pay | Admitting: Family Medicine

## 2017-08-22 DIAGNOSIS — E1121 Type 2 diabetes mellitus with diabetic nephropathy: Secondary | ICD-10-CM

## 2017-08-22 DIAGNOSIS — IMO0002 Reserved for concepts with insufficient information to code with codable children: Secondary | ICD-10-CM

## 2017-08-22 DIAGNOSIS — E1165 Type 2 diabetes mellitus with hyperglycemia: Principal | ICD-10-CM

## 2017-09-06 ENCOUNTER — Ambulatory Visit
Admission: EM | Admit: 2017-09-06 | Discharge: 2017-09-06 | Disposition: A | Discharge status: Home / Self Care | Payer: Medicare Other | Source: Home / Self Care

## 2017-09-06 ENCOUNTER — Encounter: Payer: Self-pay | Admitting: Gynecology

## 2017-09-06 ENCOUNTER — Encounter: Payer: Self-pay | Admitting: Emergency Medicine

## 2017-09-06 ENCOUNTER — Emergency Department
Admission: EM | Admit: 2017-09-06 | Discharge: 2017-09-06 | Disposition: A | Discharge status: Home / Self Care | Payer: Medicare Other | Attending: Emergency Medicine | Admitting: Emergency Medicine

## 2017-09-06 ENCOUNTER — Other Ambulatory Visit: Payer: Self-pay

## 2017-09-06 ENCOUNTER — Emergency Department: Payer: Medicare Other

## 2017-09-06 DIAGNOSIS — R6883 Chills (without fever): Secondary | ICD-10-CM | POA: Diagnosis not present

## 2017-09-06 DIAGNOSIS — N200 Calculus of kidney: Secondary | ICD-10-CM

## 2017-09-06 DIAGNOSIS — N183 Chronic kidney disease, stage 3 (moderate): Secondary | ICD-10-CM | POA: Diagnosis not present

## 2017-09-06 DIAGNOSIS — R1032 Left lower quadrant pain: Secondary | ICD-10-CM | POA: Insufficient documentation

## 2017-09-06 DIAGNOSIS — E119 Type 2 diabetes mellitus without complications: Secondary | ICD-10-CM | POA: Insufficient documentation

## 2017-09-06 DIAGNOSIS — Z79899 Other long term (current) drug therapy: Secondary | ICD-10-CM | POA: Diagnosis not present

## 2017-09-06 DIAGNOSIS — Z794 Long term (current) use of insulin: Secondary | ICD-10-CM | POA: Diagnosis not present

## 2017-09-06 DIAGNOSIS — R110 Nausea: Secondary | ICD-10-CM | POA: Diagnosis not present

## 2017-09-06 DIAGNOSIS — Z7982 Long term (current) use of aspirin: Secondary | ICD-10-CM | POA: Diagnosis not present

## 2017-09-06 DIAGNOSIS — I2510 Atherosclerotic heart disease of native coronary artery without angina pectoris: Secondary | ICD-10-CM | POA: Insufficient documentation

## 2017-09-06 DIAGNOSIS — I129 Hypertensive chronic kidney disease with stage 1 through stage 4 chronic kidney disease, or unspecified chronic kidney disease: Secondary | ICD-10-CM | POA: Diagnosis not present

## 2017-09-06 DIAGNOSIS — R197 Diarrhea, unspecified: Secondary | ICD-10-CM | POA: Insufficient documentation

## 2017-09-06 DIAGNOSIS — R112 Nausea with vomiting, unspecified: Secondary | ICD-10-CM

## 2017-09-06 LAB — COMPREHENSIVE METABOLIC PANEL
ALBUMIN: 3.7 g/dL (ref 3.5–5.0)
ALK PHOS: 279 U/L — AB (ref 38–126)
ALT: 98 U/L — ABNORMAL HIGH (ref 0–44)
ANION GAP: 6 (ref 5–15)
AST: 64 U/L — AB (ref 15–41)
BILIRUBIN TOTAL: 0.7 mg/dL (ref 0.3–1.2)
BUN: 26 mg/dL — ABNORMAL HIGH (ref 8–23)
CALCIUM: 9 mg/dL (ref 8.9–10.3)
CO2: 19 mmol/L — AB (ref 22–32)
Chloride: 109 mmol/L (ref 98–111)
Creatinine, Ser: 1.21 mg/dL — ABNORMAL HIGH (ref 0.44–1.00)
GFR calc Af Amer: 53 mL/min — ABNORMAL LOW (ref >60)
GFR calc non Af Amer: 46 mL/min — ABNORMAL LOW (ref >60)
GLUCOSE: 220 mg/dL — AB (ref 70–99)
Potassium: 5.7 mmol/L — ABNORMAL HIGH (ref 3.5–5.1)
SODIUM: 134 mmol/L — AB (ref 135–145)
Total Protein: 7.4 g/dL (ref 6.5–8.1)

## 2017-09-06 LAB — URINALYSIS, COMPLETE (UACMP) WITH MICROSCOPIC
Bacteria, UA: NONE SEEN
Bilirubin Urine: NEGATIVE
GLUCOSE, UA: NEGATIVE mg/dL
KETONES UR: NEGATIVE mg/dL
LEUKOCYTES UA: NEGATIVE
Nitrite: NEGATIVE
PH: 5 (ref 5.0–8.0)
Protein, ur: 30 mg/dL — AB
RBC / HPF: >50 RBC/hpf (ref 0–5)
Specific Gravity, Urine: 1.02 (ref 1.005–1.030)
WBC, UA: NONE SEEN WBC/hpf (ref 0–5)

## 2017-09-06 LAB — CBC
HEMATOCRIT: 33 % — AB (ref 35.0–47.0)
HEMOGLOBIN: 10.8 g/dL — AB (ref 12.0–16.0)
MCH: 29 pg (ref 26.0–34.0)
MCHC: 32.9 g/dL (ref 32.0–36.0)
MCV: 88.4 fL (ref 80.0–100.0)
Platelets: 317 10*3/uL (ref 150–440)
RBC: 3.74 MIL/uL — ABNORMAL LOW (ref 3.80–5.20)
RDW: 15.1 % — AB (ref 11.5–14.5)
WBC: 12.5 10*3/uL — ABNORMAL HIGH (ref 3.6–11.0)

## 2017-09-06 LAB — LIPASE, BLOOD: Lipase: 72 U/L — ABNORMAL HIGH (ref 11–51)

## 2017-09-06 MED ORDER — HYDROCODONE-ACETAMINOPHEN 5-325 MG PO TABS: 0.5000 | ORAL_TABLET | Freq: Four times a day (QID) | ORAL | 0 refills | Status: DC | PRN

## 2017-09-06 MED ORDER — MORPHINE SULFATE (PF) 4 MG/ML IV SOLN
4.0000 mg | Freq: Once | INTRAVENOUS | Status: AC
  Administered 2017-09-06: 4 mg via INTRAVENOUS
  Filled 2017-09-06: qty 1

## 2017-09-06 MED ORDER — SODIUM BICARBONATE 8.4 % IV SOLN
50.0000 meq | Freq: Once | INTRAVENOUS | Status: AC
  Administered 2017-09-06: 50 meq via INTRAVENOUS
  Filled 2017-09-06: qty 50

## 2017-09-06 MED ORDER — ONDANSETRON 4 MG PO TBDP: 4.0000 mg | ORAL_TABLET | Freq: Four times a day (QID) | ORAL | 0 refills | Status: DC | PRN

## 2017-09-06 MED ORDER — INSULIN ASPART 100 UNIT/ML ~~LOC~~ SOLN
10.0000 [IU] | Freq: Once | SUBCUTANEOUS | Status: AC
  Administered 2017-09-06: 10 [IU] via INTRAVENOUS
  Filled 2017-09-06: qty 1

## 2017-09-06 MED ORDER — ONDANSETRON HCL 4 MG/2ML IJ SOLN
4.0000 mg | Freq: Once | INTRAMUSCULAR | Status: AC
  Administered 2017-09-06: 4 mg via INTRAVENOUS
  Filled 2017-09-06: qty 2

## 2017-09-06 MED ORDER — SODIUM CHLORIDE 0.9 % IV BOLUS
1000.0000 mL | Freq: Once | INTRAVENOUS | Status: AC
  Administered 2017-09-06: 1000 mL via INTRAVENOUS

## 2017-09-06 MED ORDER — TAMSULOSIN HCL 0.4 MG PO CAPS: 0.4000 mg | ORAL_CAPSULE | Freq: Every day | ORAL | 0 refills | Status: DC

## 2017-09-06 MED ORDER — DEXTROSE 50 % IV SOLN
25.0000 mL | Freq: Once | INTRAVENOUS | Status: AC
  Administered 2017-09-06: 25 mL via INTRAVENOUS
  Filled 2017-09-06: qty 50

## 2017-09-06 MED ORDER — TAMSULOSIN HCL 0.4 MG PO CAPS
0.4000 mg | ORAL_CAPSULE | ORAL | Status: AC
  Administered 2017-09-06: 0.4 mg via ORAL
  Filled 2017-09-06: qty 1

## 2017-09-06 MED ORDER — IOHEXOL 300 MG/ML  SOLN
75.0000 mL | Freq: Once | INTRAMUSCULAR | Status: AC | PRN
  Administered 2017-09-06: 75 mL via INTRAVENOUS

## 2017-09-06 MED ORDER — ONDANSETRON 8 MG PO TBDP
8.0000 mg | ORAL_TABLET | Freq: Once | ORAL | Status: AC
  Administered 2017-09-06: 8 mg via ORAL

## 2017-09-06 NOTE — ED Provider Notes (Signed)
Vitals:   09/06/17 2030 09/06/17 2130  BP: (!) 144/66 (!) 143/63  Pulse: 60 (!) 55  Resp: (!) 21 (!) 23  Temp:    SpO2: 100% 96%      Ct Abdomen Pelvis W Contrast  Result Date: 09/06/2017 CLINICAL DATA:  History of diabetes and kidney stones. Abdominal pain. Pain worse on the left. EXAM: CT ABDOMEN AND PELVIS WITH CONTRAST TECHNIQUE: Multidetector CT imaging of the abdomen and pelvis was performed using the standard protocol following bolus administration of intravenous contrast. CONTRAST:  57mL OMNIPAQUE IOHEXOL 300 MG/ML  SOLN COMPARISON:  None. FINDINGS: Lower chest: Mild basilar atelectasis or scarring. Hepatobiliary: Fatty change of the liver. No focal hepatic lesion. No calcified gallstones. No ductal dilatation. Pancreas: Normal appearance of the pancreas. Spleen: Normal Adrenals/Urinary Tract: Adrenal glands are normal. Renal parenchyma is normal on each side. One or 2 mm stone in the lower pole of the left kidney. Fullness of both renal collecting systems, more on the left than the right. 2 x 3 mm stone in the left ureter at the L4-5 level. No stone distal to that. Bladder appears normal. Stomach/Bowel: No acute bowel pathology. Diverticulosis without evidence of diverticulitis. Vascular/Lymphatic: Aortic atherosclerosis. No aneurysm. IVC is normal. No retroperitoneal adenopathy. Reproductive: Multiple leiomyomas of the uterus, with variable calcification. No adnexal mass. Other: No free fluid or air. Musculoskeletal: Lower lumbar degenerative disease with degenerative spondylolisthesis at L5-S1. IMPRESSION: 2 x 3 mm stone in the left ureter at the L4-5 level with mild left hydroureteronephrosis. Mild fullness of the right renal collecting system but no sign of obstructing abnormality. Leiomyomas of the uterus. Fatty liver. Electronically Signed   By: Nelson Chimes M.D.   On: 09/06/2017 21:22     Patient resting comfortably.  Reports her pain is controlled.  She reports that she had a  previous kidney stone about a year to go in [place], treated with tamsulosin with good effect.  She generally takes just Tylenol for pain control, but discussed with her and her son and will prescribe a very low-dose including half tablet of hydrocodone.  ----------------------------------------- 10:37 PM on 09/06/2017 -----------------------------------------  I will prescribe the patient a narcotic pain medicine due to their condition which I anticipate will cause at least moderate pain short term. I discussed with the patient safe use of narcotic pain medicines, and that they are not to drive, work in dangerous areas, or ever take more than prescribed (no more than a half (0.5 tab) pill every 6 hours). We discussed that this is the type of medication that can be  overdosed on and the risks of this type of medicine. Patient is very agreeable to only use as prescribed and to never use more than prescribed, thinks she likely won't even take it but will have if needed in event break through pain.   Return precautions and treatment recommendations and follow-up discussed with the patient and her son who are agreeable with the plan.      Delman Kitten, MD 09/07/17 [phone]

## 2017-09-06 NOTE — ED Provider Notes (Addendum)
[facility] Emergency Department Provider Note  ____________________________________________  Time seen: Approximately 8:37 PM  I have reviewed the triage vital signs and the nursing notes.   HISTORY  Chief Complaint Abdominal Pain   HPI Elizabeth Klein is a 67 y.o. female with a history of diabetes, kidney stones, hypertension, GERD, chronic kidney disease who presents for evaluation of abdominal pain.  Patient reports that the pain woke her up at 1 AM, it is sharp, severe, located in the left side of her abdomen, associated with nausea and several episodes of nonbloody nonbilious emesis.  She also had several episodes of watery diarrhea, no melena, no hematemesis, no coffee-ground emesis.  She has had chills but no fever.  She denies dysuria or hematuria.  Patient went to urgent care and was sent to the emergency room for evaluation.  She reports the pain is 9 out of 10.  She has had hysterectomy but no other abdominal surgeries.  Past Medical History:  Diagnosis Date  . Acid reflux   . Chest pain   . Diabetes mellitus without complication ([place])   . High cholesterol   . Hypertension   . Kidney stones   . Migraines   . Psoriasis   . Rapid heart rate   . Weight loss     Patient Active Problem List   Diagnosis Date Noted  . Microalbuminuria 04/30/2017  . CKD (chronic kidney disease) stage 3, GFR 30-59 ml/min (HCC) 04/30/2017  . Positive depression screening 03/19/2017  . Uncontrolled type 2 diabetes mellitus with diabetic nephropathy ([place]) 03/19/2017  . Hypertension 03/19/2017  . CAD (coronary artery disease) 03/19/2017  . History of kidney stones 03/19/2017  . Psoriasis 03/19/2017  . Hyperlipidemia 03/19/2017  . Nocturnal leg cramps 03/19/2017  . Gait instability 03/19/2017  . Insomnia 03/19/2017  . Allergic rhinitis 03/19/2017    Past Surgical History:  Procedure Laterality Date  . BREAST BIOPSY Right    neg  . CESAREAN SECTION       Prior to Admission medications   Medication Sig Start Date End Date Taking? Authorizing Provider  ACCU-CHEK FASTCLIX LANCETS MISC Use with accucheck machine to check BS up to 3 times daily for DM2 ICD10 E11.9 07/01/17   Juline Patch, MD  acetaminophen (TYLENOL) 500 MG tablet Take 1,000 mg by mouth every 8 (eight) hours as needed.    [provider]  aspirin EC 81 MG tablet Take 1 tablet (81 mg total) by mouth daily. 07/01/17   Juline Patch, MD  atorvastatin (LIPITOR) 80 MG tablet Take 1 tablet (80 mg total) by mouth daily. 07/01/17   Juline Patch, MD  Calcipotriene-Betameth Diprop (ENSTILAR) 0.005-0.064 % FOAM Apply 1 application topically 2 (two) times daily.    [provider]  Dulaglutide (TRULICITY) 5.17 OH/6.0VP SOPN Inject 0.75 mg into the skin once a week. 07/01/17   Juline Patch, MD  fluocinonide ointment (LIDEX) 7.10 % Apply 1 application topically 2 (two) times daily. 04/30/17   Plonk, Gwyndolyn Saxon, MD  glucose blood test strip Use to check BS with Accucheck Meter up to 3 times daily for DM2 E11.9 07/01/17   Juline Patch, MD  irbesartan (AVAPRO) 300 MG tablet Take 1 tablet (300 mg total) by mouth daily. 07/01/17   Juline Patch, MD  LANTUS 100 UNIT/ML injection INJECT SUBCUTANEOUSLY 40  UNITS EVERY NIGHT AT  BEDTIME 08/22/17   Juline Patch, MD  metFORMIN (GLUCOPHAGE) 1000 MG tablet Take 1 tablet (1,000 mg  total) by mouth 2 (two) times daily with a meal. 07/01/17   Juline Patch, MD  metoprolol tartrate (LOPRESSOR) 50 MG tablet Take 1 tablet (50 mg total) by mouth 2 (two) times daily. 07/01/17   Juline Patch, MD  montelukast (SINGULAIR) 10 MG tablet Take 1 tablet (10 mg total) by mouth daily. 07/01/17   Juline Patch, MD  omeprazole (PRILOSEC) 20 MG capsule Take 1 capsule (20 mg total) by mouth daily. 07/01/17   Juline Patch, MD  Syringe, Disposable, (B-D 30CC SYRINGE) 30 ML MISC Use with Lantus Injections for DM2 ICD 10 E11.9 07/01/17   Juline Patch, MD   traZODone (DESYREL) 50 MG tablet Take 1 tablet (50 mg total) by mouth at bedtime. 07/01/17   Juline Patch, MD    Allergies Patient has no known allergies.  Family History  Problem Relation Age of Onset  . Breast cancer Neg Hx   . Diabetes Brother   . Heart attack Brother   . Diabetes Brother   . Heart attack Brother   . Diabetes Brother     Social History Social History   Tobacco Use  . Smoking status: Never Smoker  . Smokeless tobacco: Never Used  Substance Use Topics  . Alcohol use: No    Frequency: Never  . Drug use: No    Review of Systems  Constitutional: Negative for fever. + chills Eyes: Negative for visual changes. ENT: Negative for sore throat. Neck: No neck pain  Cardiovascular: Negative for chest pain. Respiratory: Negative for shortness of breath. Gastrointestinal: + LLQ abdominal pain, vomiting and diarrhea. Genitourinary: Negative for dysuria. Musculoskeletal: Negative for back pain. Skin: Negative for rash. Neurological: Negative for headaches, weakness or numbness. Psych: No SI or HI  ____________________________________________   PHYSICAL EXAM:  VITAL SIGNS: ED Triage Vitals  Enc Vitals Group     BP 09/06/17 1726 139/67     Pulse Rate 09/06/17 1726 (!) 59     Resp 09/06/17 1726 12     Temp 09/06/17 1726 99.1 F (37.3 C)     Temp Source 09/06/17 1726 Oral     SpO2 09/06/17 1726 99 %     Weight 09/06/17 1734 137 lb (62.1 kg)     Height 09/06/17 1734 5' (1.524 m)     Head Circumference --      Peak Flow --      Pain Score 09/06/17 1733 7     Pain Loc --      Pain Edu? --      Excl. in [place]? --     Constitutional: Alert and oriented, looks uncomfortable, moaning, holding her left lower abdomen.  HEENT:      Head: Normocephalic and atraumatic.         Eyes: Conjunctivae are normal. Sclera is non-icteric.       Mouth/Throat: Mucous membranes are moist.       Neck: Supple with no signs of meningismus. Cardiovascular: Regular rate  and rhythm. No murmurs, gallops, or rubs. 2+ symmetrical distal pulses are present in all extremities. No JVD. Respiratory: Normal respiratory effort. Lungs are clear to auscultation bilaterally. No wheezes, crackles, or rhonchi.  Gastrointestinal: Soft, tender to palpation on the left lower quadrant, and non distended with positive bowel sounds. No rebound or guarding. Genitourinary: No CVA tenderness. Musculoskeletal: Nontender with normal range of motion in all extremities. No edema, cyanosis, or erythema of extremities. Neurologic: Normal speech and language. Face is symmetric. Moving all extremities.  No gross focal neurologic deficits are appreciated. Skin: Skin is warm, dry and intact. No rash noted. Psychiatric: Mood and affect are normal. Speech and behavior are normal.  ____________________________________________   LABS (all labs ordered are listed, but only abnormal results are displayed)  Labs Reviewed  LIPASE, BLOOD - Abnormal; Notable for the following components:      Result Value   Lipase 72 (*)    All other components within normal limits  COMPREHENSIVE METABOLIC PANEL - Abnormal; Notable for the following components:   Sodium 134 (*)    Potassium 5.7 (*)    CO2 19 (*)    Glucose, Bld 220 (*)    BUN 26 (*)    Creatinine, Ser 1.21 (*)    AST 64 (*)    ALT 98 (*)    Alkaline Phosphatase 279 (*)    GFR calc non Af Amer 46 (*)    GFR calc Af Amer 53 (*)    All other components within normal limits  CBC - Abnormal; Notable for the following components:   WBC 12.5 (*)    RBC 3.74 (*)    Hemoglobin 10.8 (*)    HCT 33.0 (*)    RDW 15.1 (*)    All other components within normal limits   ____________________________________________  EKG  ED ECG REPORT I, Rudene Re, the attending physician, personally viewed and interpreted this ECG.  Normal sinus rhythm, rate of 64, normal intervals, normal axis, no ST elevations or depressions.  Normal  EKG. ____________________________________________  RADIOLOGY  CT a/p: PND   ____________________________________________   PROCEDURES  Procedure(s) performed: None Procedures Critical Care performed:  None ____________________________________________   INITIAL IMPRESSION / ASSESSMENT AND PLAN / ED COURSE  67 y.o. female with a history of diabetes, kidney stones, hypertension, GERD, chronic kidney disease who presents for evaluation of LLQ abdominal pain, nausea, vomiting, diarrhea and chills.  Patient is in obvious distress due to pain, holding her abdomen, vitals are within normal limits, she is tender to palpation on the left lower quadrant with no rebound or guarding.  Labs showing several abnormalities including mildly elevated LFTs and lipase, hyperkalemia with no EKG changes, and a leukocytosis with white count of 12.5.  Differential diagnosis includes but not limited to diverticulitis versus colitis versus kidney stone versus pyelonephritis versus SBO versus gallbladder disease versus pancreatitis versus appendicitis. Will give morphine, Zofran and fluids for symptom relief.  CT abdomen pelvis is pending.  Will also give bicarb, D50, insulin, and fluids for hyperkalemia. Care transferred to Dr. Jacqualine Code.        As part of my medical decision making, I reviewed the following data within the [facility] notes reviewed and incorporated, Labs reviewed , EKG interpreted , Patient signed out to Dr. Jacqualine Code, Notes from prior ED visits and [place] Controlled Substance Database    Pertinent labs & imaging results that were available during my care of the patient were reviewed by me and considered in my medical decision making (see chart for details).    ____________________________________________   FINAL CLINICAL IMPRESSION(S) / ED DIAGNOSES  Final diagnoses:  LLQ abdominal pain  Nausea vomiting and diarrhea  Transaminitis  Elevated lipase  Hyperkalemia       NEW MEDICATIONS STARTED DURING THIS VISIT:  ED Discharge Orders    None       Note:  This document was prepared using Dragon voice recognition software and may include unintentional dictation errors.    [place], [place],  MD 09/06/17 3578    Rudene Re, MD 09/06/17 2052

## 2017-09-06 NOTE — ED Triage Notes (Signed)
Per patient lower pelvic pain x last pm. Per patient abdominal pain stated after she had bone marrow soup at home. Per patient nausea / vomiting and hard stool. Patient also c/o of frequent urination.

## 2017-09-06 NOTE — Discharge Instructions (Addendum)
Concern for your exquisite left lower quadrant pain.   As discussed, differentials include diverticulitis or possibly renal stone. You will need stat imaging.   Please go to emergency room in [facility]  Emergency [address] [place], [place] [id number]   Thinking of you and wish you safe care.

## 2017-09-06 NOTE — ED Provider Notes (Signed)
[facility]    CSN: [id number] Arrival date & time: 09/06/17  1528     History   Chief Complaint Chief Complaint  Patient presents with  . Abdominal Pain    HPI Elizabeth Klein is a 67 y.o. female.   CC: severe LLQ abdominal pain last night which awoken to her, improved today however comes and goes.  Rates 8/10 right now.  Felt nauseated at that time, had to put fingers down throat to throw up - did this 4 times.   Endorses loose BM,  Notes urine is 'darker.' No dysuria, urinary frequency.  NO blood in stool or emesis. Emesis was white, frothy.   No constipation however has not had much of a BM per patient today.   H/o renal stones.  H/o c/s.  No h/o diverticulitis however diverticula noted on colonoscopy.   H/o DM       Past Medical History:  Diagnosis Date  . Acid reflux   . Chest pain   . Diabetes mellitus without complication ([place])   . High cholesterol   . Hypertension   . Kidney stones   . Migraines   . Psoriasis   . Rapid heart rate   . Weight loss     Patient Active Problem List   Diagnosis Date Noted  . Microalbuminuria 04/30/2017  . CKD (chronic kidney disease) stage 3, GFR 30-59 ml/min (HCC) 04/30/2017  . Positive depression screening 03/19/2017  . Uncontrolled type 2 diabetes mellitus with diabetic nephropathy ([place]) 03/19/2017  . Hypertension 03/19/2017  . CAD (coronary artery disease) 03/19/2017  . History of kidney stones 03/19/2017  . Psoriasis 03/19/2017  . Hyperlipidemia 03/19/2017  . Nocturnal leg cramps 03/19/2017  . Gait instability 03/19/2017  . Insomnia 03/19/2017  . Allergic rhinitis 03/19/2017    Past Surgical History:  Procedure Laterality Date  . BREAST BIOPSY Right    neg  . CESAREAN SECTION      OB History   None      Home Medications    Prior to Admission medications   Medication Sig Start Date End Date Taking? Authorizing Provider  ACCU-CHEK FASTCLIX LANCETS MISC Use with accucheck  machine to check BS up to 3 times daily for DM2 ICD10 E11.9 07/01/17  Yes Juline Patch, MD  acetaminophen (TYLENOL) 500 MG tablet Take 1,000 mg by mouth every 8 (eight) hours as needed.   Yes [provider]  aspirin EC 81 MG tablet Take 1 tablet (81 mg total) by mouth daily. 07/01/17  Yes Juline Patch, MD  atorvastatin (LIPITOR) 80 MG tablet Take 1 tablet (80 mg total) by mouth daily. 07/01/17  Yes Juline Patch, MD  Calcipotriene-Betameth Diprop (ENSTILAR) 0.005-0.064 % FOAM Apply 1 application topically 2 (two) times daily.   Yes [provider]  Dulaglutide (TRULICITY) 3.23 FT/7.3UK SOPN Inject 0.75 mg into the skin once a week. 07/01/17  Yes Juline Patch, MD  fluocinonide ointment (LIDEX) 0.25 % Apply 1 application topically 2 (two) times daily. 04/30/17  Yes Plonk, Gwyndolyn Saxon, MD  glucose blood test strip Use to check BS with Accucheck Meter up to 3 times daily for DM2 E11.9 07/01/17  Yes Juline Patch, MD  irbesartan (AVAPRO) 300 MG tablet Take 1 tablet (300 mg total) by mouth daily. 07/01/17  Yes Juline Patch, MD  LANTUS 100 UNIT/ML injection INJECT SUBCUTANEOUSLY 40  UNITS EVERY NIGHT AT  BEDTIME 08/22/17  Yes Juline Patch, MD  metFORMIN (GLUCOPHAGE) 1000 MG tablet  Take 1 tablet (1,000 mg total) by mouth 2 (two) times daily with a meal. 07/01/17  Yes Juline Patch, MD  metoprolol tartrate (LOPRESSOR) 50 MG tablet Take 1 tablet (50 mg total) by mouth 2 (two) times daily. 07/01/17  Yes Juline Patch, MD  montelukast (SINGULAIR) 10 MG tablet Take 1 tablet (10 mg total) by mouth daily. 07/01/17  Yes Juline Patch, MD  omeprazole (PRILOSEC) 20 MG capsule Take 1 capsule (20 mg total) by mouth daily. 07/01/17  Yes Juline Patch, MD  Syringe, Disposable, (B-D 30CC SYRINGE) 30 ML MISC Use with Lantus Injections for DM2 ICD 10 E11.9 07/01/17  Yes Juline Patch, MD  traZODone (DESYREL) 50 MG tablet Take 1 tablet (50 mg total) by mouth at bedtime. 07/01/17  Yes Juline Patch, MD     Family History Family History  Problem Relation Age of Onset  . Breast cancer Neg Hx   . Diabetes Brother   . Heart attack Brother   . Diabetes Brother   . Heart attack Brother   . Diabetes Brother     Social History Social History   Tobacco Use  . Smoking status: Never Smoker  . Smokeless tobacco: Never Used  Substance Use Topics  . Alcohol use: No    Frequency: Never  . Drug use: No     Allergies   Patient has no known allergies.   Review of Systems Review of Systems  Constitutional: Negative for chills and fever.  Respiratory: Negative for cough.   Cardiovascular: Negative for chest pain and palpitations.  Gastrointestinal: Positive for abdominal pain, nausea and vomiting. Negative for abdominal distention, blood in stool and constipation.  Genitourinary: Negative for difficulty urinating and dysuria.  Neurological: Negative for dizziness and headaches.     Physical Exam Triage Vital Signs ED Triage Vitals  Enc Vitals Group     BP 09/06/17 1552 (!) 143/91     Pulse Rate 09/06/17 1552 72     Resp --      Temp 09/06/17 1552 98.3 F (36.8 C)     Temp Source 09/06/17 1552 Axillary     SpO2 09/06/17 1552 100 %     Weight 09/06/17 1555 137 lb (62.1 kg)     Height 09/06/17 1555 5' (1.524 m)     Head Circumference --      Peak Flow --      Pain Score 09/06/17 1555 10     Pain Loc --      Pain Edu? --      Excl. in [place]? --    No data found.  Updated Vital Signs BP (!) 143/91 (BP Location: Left Arm)   Pulse 72   Temp 98.3 F (36.8 C) (Axillary)   Ht 5' (1.524 m)   Wt 137 lb (62.1 kg)   SpO2 100%   BMI 26.76 kg/m   Visual Acuity Right Eye Distance:   Left Eye Distance:   Bilateral Distance:    Right Eye Near:   Left Eye Near:    Bilateral Near:     Physical Exam  Constitutional: She appears well-developed and well-nourished.  Eyes: Conjunctivae are normal.  Cardiovascular: Normal rate, regular rhythm, normal heart sounds and normal  pulses.  Pulmonary/Chest: Effort normal and breath sounds normal. She has no wheezes. She has no rhonchi. She has no rales.  Abdominal: Soft. Normal appearance and bowel sounds are normal. She exhibits no distension, no fluid wave, no ascites and no mass.  There is tenderness in the left lower quadrant. There is rebound and guarding. There is no rigidity, no CVA tenderness, no tenderness at McBurney's point and negative Murphy's sign.  Neurological: She is alert.  Skin: Skin is warm and dry.  Psychiatric: She has a normal mood and affect. Her speech is normal and behavior is normal. Thought content normal.  Vitals reviewed.    UC Treatments / Results  Labs (all labs ordered are listed, but only abnormal results are displayed) Labs Reviewed  URINALYSIS, COMPLETE (UACMP) WITH MICROSCOPIC - Abnormal; Notable for the following components:      Result Value   APPearance HAZY (*)    Hgb urine dipstick LARGE (*)    Protein, ur 30 (*)    All other components within normal limits    EKG None  Radiology No results found.  Procedures Procedures (including critical care time)  Medications Ordered in UC Medications  ondansetron (ZOFRAN-ODT) disintegrating tablet 8 mg (8 mg Oral Given 09/06/17 1602)    Initial Impression / Assessment and Plan / UC Course  I have reviewed the triage vital signs and the nursing notes.  Pertinent labs & imaging results that were available during my care of the patient were reviewed by me and considered in my medical decision making (see chart for details).      Final Clinical Impressions(s) / UC Diagnoses   Final diagnoses:  Left lower quadrant pain  Patient appears in acute distress. Exquisite LLQ pain, rebound on exam. Differentials include perforation, obstruction, diverticulitis, renal stone.  Discussed with patient and family at bedside that we would be more comfortable with patient being in a higher level of care, such as emergency room.  Patient  and family agreeable to this.  Offered emergency transfer patient.  Patient and son politely declined and he stated he would drive her by private vehicle to [facility] emergency room.  Provided address.  Patient was stable at time of discharge. Report given to [facility] triage.   Discharge Instructions     Concern for your exquisite left lower quadrant pain.   As discussed, differentials include diverticulitis or possibly renal stone. You will need stat imaging.   Please go to emergency room in [facility]  Emergency [address] [place], [place] [address]   Thinking of you and wish you safe care.     ED Prescriptions    None     Controlled Substance Prescriptions [place] Controlled Substance Registry consulted? Not Applicable   Burnard Hawthorne, [place] 09/06/17 1702

## 2017-09-06 NOTE — ED Triage Notes (Signed)
C/O left lower abdominal pain since 0100.  Symptoms accompanied by nausea and small amount of nausea.  Seen through [facility] and sent to ED for evaluation to r/o renal colic / diverticulitis.

## 2017-09-06 NOTE — Discharge Instructions (Signed)
You have been seen in the Emergency Department (ED) today for pain that we believe based on your workup, is caused by kidney stones.  As we have discussed, please drink plenty of fluids.  Please make a follow up appointment with the physician(s) listed elsewhere in this documentation.  You may take pain medication as needed but ONLY as prescribed.  Please also take your prescribed Flomax daily.  We also recommend that you take over-the-counter ibuprofen regularly according to label instructions over the next 5 days.  Take it with meals to minimize stomach discomfort.  Please see your doctor as soon as possible as stones may take 1-3 weeks to pass and you may require additional care or medications.  Do not drink alcohol, drive or participate in any other potentially dangerous activities while taking opiate pain medication as it may make you sleepy. Do not take this medication with any other sedating medications, either prescription or over-the-counter. If you were prescribed Percocet or Vicodin, do not take these with acetaminophen (Tylenol) as it is already contained within these medications.   This medication is an opiate (or narcotic) pain medication and can be habit forming.  Use it as little as possible to achieve adequate pain control.  Do not use or use it with extreme caution if you have a history of opiate abuse or dependence.  If you are on a pain contract with your primary care doctor or a pain specialist, be sure to let them know you were prescribed this medication today from the [facility] Emergency Department.  This medication is intended for your use only - do not give any to anyone else and keep it in a secure place where nobody else, especially children, have access to it.  It will also cause or worsen constipation, so you may want to consider taking an over-the-counter stool softener while you are taking this medication.  Return to the Emergency Department (ED) or call your doctor  if you have any worsening pain, fever, painful urination, are unable to urinate, or develop other symptoms that concern you.

## 2017-09-06 NOTE — ED Notes (Signed)
Peripheral IV discontinued. Catheter intact. No signs of infiltration or redness. Gauze applied to IV site.   Discharge instructions reviewed with patient. Questions fielded by this RN. Patient verbalizes understanding of instructions. Patient discharged home in stable condition per quale. No acute distress noted at time of discharge.    

## 2017-09-09 ENCOUNTER — Ambulatory Visit
Admission: RE | Admit: 2017-09-09 | Discharge: 2017-09-09 | Disposition: A | Discharge status: Home / Self Care | Payer: Medicare Other | Source: Ambulatory Visit | Attending: Family Medicine | Admitting: Family Medicine

## 2017-09-09 ENCOUNTER — Ambulatory Visit (INDEPENDENT_AMBULATORY_CARE_PROVIDER_SITE_OTHER): Payer: Medicare Other | Admitting: Family Medicine

## 2017-09-09 ENCOUNTER — Encounter: Payer: Self-pay | Admitting: Family Medicine

## 2017-09-09 VITALS — BP 120/70 | HR 68 | Ht 60.0 in | Wt 142.0 lb

## 2017-09-09 DIAGNOSIS — R7989 Other specified abnormal findings of blood chemistry: Secondary | ICD-10-CM

## 2017-09-09 DIAGNOSIS — K760 Fatty (change of) liver, not elsewhere classified: Secondary | ICD-10-CM

## 2017-09-09 DIAGNOSIS — N200 Calculus of kidney: Secondary | ICD-10-CM

## 2017-09-09 DIAGNOSIS — M5136 Other intervertebral disc degeneration, lumbar region: Secondary | ICD-10-CM

## 2017-09-09 DIAGNOSIS — D72829 Elevated white blood cell count, unspecified: Secondary | ICD-10-CM

## 2017-09-09 DIAGNOSIS — R945 Abnormal results of liver function studies: Secondary | ICD-10-CM

## 2017-09-09 DIAGNOSIS — R109 Unspecified abdominal pain: Secondary | ICD-10-CM | POA: Diagnosis not present

## 2017-09-09 DIAGNOSIS — M25511 Pain in right shoulder: Secondary | ICD-10-CM

## 2017-09-09 LAB — POCT URINALYSIS DIPSTICK
Bilirubin, UA: NEGATIVE
Glucose, UA: NEGATIVE
KETONES UA: NEGATIVE
Leukocytes, UA: NEGATIVE
NITRITE UA: NEGATIVE
PROTEIN UA: NEGATIVE
RBC UA: NEGATIVE
Spec Grav, UA: 1.01 (ref 1.010–1.025)
Urobilinogen, UA: 0.2 E.U./dL
pH, UA: 5 (ref 5.0–8.0)

## 2017-09-09 MED ORDER — HYDROCODONE-ACETAMINOPHEN 5-325 MG PO TABS: 0.5000 | ORAL_TABLET | Freq: Four times a day (QID) | ORAL | 0 refills | Status: DC | PRN

## 2017-09-09 NOTE — Progress Notes (Signed)
Name: Elizabeth Klein   MRN: [id number]    DOB: 08/05/1950   Date:09/09/2017       Progress Note  Subjective  Chief Complaint  Chief Complaint  Patient presents with  . Follow-up    ER- kidney stone- needs refill on hydrocodone until she can get to urology on Tuesday/ nauseated and no appetite    Flank Pain  This is a recurrent (previous episode) problem. The current episode started in the past 7 days. The problem occurs intermittently. The problem has been gradually improving ("much better") since onset. The quality of the pain is described as stabbing. The pain is at a severity of 3/10. The pain is mild. Associated symptoms include dysuria, leg pain, numbness and tingling. Pertinent negatives include no abdominal pain, bladder incontinence, bowel incontinence, chest pain, fever, headaches, paresis, paresthesias, pelvic pain, perianal numbness, weakness or weight loss. She has tried analgesics and NSAIDs for the symptoms.  Back Pain  This is a chronic (since 1999) problem. The current episode started more than 1 year ago. The problem has been waxing and waning since onset. Associated symptoms include dysuria, leg pain, numbness and tingling. Pertinent negatives include no abdominal pain, bladder incontinence, bowel incontinence, chest pain, fever, headaches, paresis, paresthesias, pelvic pain, perianal numbness, weakness or weight loss. She has tried analgesics and NSAIDs for the symptoms. The treatment provided moderate relief.  Shoulder Pain   The pain is present in the right shoulder. This is a new problem. The current episode started in the past 7 days. The problem occurs constantly. The problem has been waxing and waning. The pain is at a severity of 7/10. The pain is moderate. Associated symptoms include a limited range of motion, numbness, stiffness and tingling. Pertinent negatives include no fever. She has tried NSAIDS for the symptoms.    No problem-specific Assessment & Plan notes  found for this encounter.   Past Medical History:  Diagnosis Date  . Acid reflux   . Chest pain   . Diabetes mellitus without complication ([place])   . High cholesterol   . Hypertension   . Kidney stones   . Migraines   . Psoriasis   . Rapid heart rate   . Weight loss     Past Surgical History:  Procedure Laterality Date  . BREAST BIOPSY Right    neg  . CESAREAN SECTION      Family History  Problem Relation Age of Onset  . Breast cancer Neg Hx   . Diabetes Brother   . Heart attack Brother   . Diabetes Brother   . Heart attack Brother   . Diabetes Brother     Social History   Socioeconomic History  . Marital status: Married    Spouse name: Not on file  . Number of children: Not on file  . Years of education: Not on file  . Highest education level: Not on file  Occupational History  . Not on file  Social Needs  . Financial resource strain: Not hard at all  . Food insecurity:    Worry: Never true    Inability: Never true  . Transportation needs:    Medical: No    Non-medical: No  Tobacco Use  . Smoking status: Never Smoker  . Smokeless tobacco: Never Used  Substance and Sexual Activity  . Alcohol use: No    Frequency: Never  . Drug use: No  . Sexual activity: Not on file  Lifestyle  . Physical activity:    Days  per week: 0 days    Minutes per session: 0 min  . Stress: To some extent  Relationships  . Social connections:    Talks on phone: More than three times a week    Gets together: Twice a week    Attends religious service: 1 to 4 times per year    Active member of club or organization: Yes    Attends meetings of clubs or organizations: Never    Relationship status: Married  . Intimate partner violence:    Fear of current or ex partner: Patient refused    Emotionally abused: Patient refused    Physically abused: Patient refused    Forced sexual activity: Patient refused  Other Topics Concern  . Not on file  Social History Narrative   **  Merged History Encounter **        No Known Allergies  Outpatient Medications Prior to Visit  Medication Sig Dispense Refill  . ACCU-CHEK FASTCLIX LANCETS MISC Use with accucheck machine to check BS up to 3 times daily for DM2 ICD10 E11.9 100 each 12  . acetaminophen (TYLENOL) 500 MG tablet Take 1,000 mg by mouth every 8 (eight) hours as needed.    Marland Kitchen aspirin EC 81 MG tablet Take 1 tablet (81 mg total) by mouth daily. 90 tablet 3  . atorvastatin (LIPITOR) 80 MG tablet Take 1 tablet (80 mg total) by mouth daily. 90 tablet 1  . Calcipotriene-Betameth Diprop (ENSTILAR) 0.005-0.064 % FOAM Apply 1 application topically 2 (two) times daily.    . Dulaglutide (TRULICITY) 3.76 EG/3.1DV SOPN Inject 0.75 mg into the skin once a week. 13 pen 1  . fluocinonide ointment (LIDEX) 7.61 % Apply 1 application topically 2 (two) times daily. 30 g 2  . glucose blood test strip Use to check BS with Accucheck Meter up to 3 times daily for DM2 E11.9 100 each 12  . irbesartan (AVAPRO) 300 MG tablet Take 1 tablet (300 mg total) by mouth daily. 90 tablet 1  . LANTUS 100 UNIT/ML injection INJECT SUBCUTANEOUSLY 40  UNITS EVERY NIGHT AT  BEDTIME 30 mL 1  . metFORMIN (GLUCOPHAGE) 1000 MG tablet Take 1 tablet (1,000 mg total) by mouth 2 (two) times daily with a meal. 180 tablet 1  . metoprolol tartrate (LOPRESSOR) 50 MG tablet Take 1 tablet (50 mg total) by mouth 2 (two) times daily. 180 tablet 1  . montelukast (SINGULAIR) 10 MG tablet Take 1 tablet (10 mg total) by mouth daily. 90 tablet 1  . omeprazole (PRILOSEC) 20 MG capsule Take 1 capsule (20 mg total) by mouth daily. 90 capsule 1  . ondansetron (ZOFRAN ODT) 4 MG disintegrating tablet Take 1 tablet (4 mg total) by mouth every 6 (six) hours as needed for nausea or vomiting. 20 tablet 0  . Syringe, Disposable, (B-D 30CC SYRINGE) 30 ML MISC Use with Lantus Injections for DM2 ICD 10 E11.9 50 each 12  . tamsulosin (FLOMAX) 0.4 MG CAPS capsule Take 1 capsule (0.4 mg total) by  mouth daily. 7 capsule 0  . traZODone (DESYREL) 50 MG tablet Take 1 tablet (50 mg total) by mouth at bedtime. 90 tablet 1  . HYDROcodone-acetaminophen (NORCO/VICODIN) 5-325 MG tablet Take 0.5 tablets by mouth every 6 (six) hours as needed for severe pain. 5 tablet 0   No facility-administered medications prior to visit.     Review of Systems  Constitutional: Negative for chills, fever, malaise/fatigue and weight loss.  HENT: Negative for ear discharge, ear pain and sore  throat.   Eyes: Negative for blurred vision.  Respiratory: Negative for cough, sputum production, shortness of breath and wheezing.   Cardiovascular: Negative for chest pain, palpitations and leg swelling.  Gastrointestinal: Negative for abdominal pain, blood in stool, bowel incontinence, constipation, diarrhea, heartburn, melena and nausea.  Genitourinary: Positive for dysuria and flank pain. Negative for bladder incontinence, frequency, hematuria, pelvic pain and urgency.  Musculoskeletal: Positive for back pain and stiffness. Negative for joint pain, myalgias and neck pain.  Skin: Negative for rash.  Neurological: Positive for tingling and numbness. Negative for dizziness, sensory change, focal weakness, weakness, headaches and paresthesias.  Endo/Heme/Allergies: Negative for environmental allergies and polydipsia. Does not bruise/bleed easily.  Psychiatric/Behavioral: Negative for depression and suicidal ideas. The patient is not nervous/anxious and does not have insomnia.      Objective  Vitals:   09/09/17 0828  BP: 120/70  Pulse: 68  Weight: 142 lb (64.4 kg)  Height: 5' (1.524 m)    Physical Exam  Constitutional: No distress.  HENT:  Head: Normocephalic and atraumatic.  Right Ear: External ear normal.  Left Ear: External ear normal.  Nose: Nose normal.  Mouth/Throat: Oropharynx is clear and moist.  Eyes: Pupils are equal, round, and reactive to light. Conjunctivae and EOM are normal. Right eye exhibits  no discharge. Left eye exhibits no discharge.  Neck: Normal range of motion. Neck supple. No JVD present. No thyromegaly present.  Cardiovascular: Normal rate, regular rhythm, normal heart sounds and intact distal pulses. Exam reveals no gallop and no friction rub.  No murmur heard. Pulmonary/Chest: Effort normal and breath sounds normal.  Abdominal: Soft. Normal aorta and bowel sounds are normal. She exhibits no distension and no mass. There is no splenomegaly or hepatomegaly. There is no tenderness. There is no guarding. No hernia.  Musculoskeletal: She exhibits no edema.       Right shoulder: She exhibits tenderness. She exhibits normal range of motion and normal strength.       Lumbar back: She exhibits spasm. She exhibits no bony tenderness.       Arms: Lymphadenopathy:    She has no cervical adenopathy.  Neurological: She is alert. She has normal reflexes.  Skin: Skin is warm and dry. She is not diaphoretic.  Nursing note and vitals reviewed.     Assessment & Plan  Problem List Items Addressed This Visit    None    Visit Diagnoses    Nephrolithiasis    -  Primary   refill Hydrocodone to get through until seen at Urology on this coming Tuesday   Relevant Medications   HYDROcodone-acetaminophen (NORCO/VICODIN) 5-325 MG tablet   Other Relevant Orders   POCT urinalysis dipstick (Completed)   Degenerative disc disease, lumbar       refill Hydrocodone   Relevant Medications   HYDROcodone-acetaminophen (NORCO/VICODIN) 5-325 MG tablet   Elevated LFTs       repeat liver function   Relevant Orders   Comprehensive metabolic panel   Hepatic steatosis       repeat liver function   Acute pain of right shoulder       ordered xray of R) shoulder   Relevant Orders   DG Shoulder Right (Completed)   Leukocytosis, unspecified type       repeat cbc   Relevant Orders   CBC with Differential/Platelet   Flank pain       order lipase and repeat liver functions and cbc   Relevant  Orders   Lipase  Meds ordered this encounter  Medications  . HYDROcodone-acetaminophen (NORCO/VICODIN) 5-325 MG tablet    Sig: Take 0.5 tablets by mouth every 6 (six) hours as needed for severe pain.    Dispense:  16 tablet    Refill:  0      Dr. Otilio Miu [facility]  09/09/17

## 2017-09-10 LAB — COMPREHENSIVE METABOLIC PANEL
ALBUMIN: 3.6 g/dL (ref 3.6–4.8)
ALK PHOS: 303 IU/L — AB (ref 39–117)
ALT: 81 IU/L — ABNORMAL HIGH (ref 0–32)
AST: 57 IU/L — ABNORMAL HIGH (ref 0–40)
Albumin/Globulin Ratio: 1.3 (ref 1.2–2.2)
BILIRUBIN TOTAL: 0.5 mg/dL (ref 0.0–1.2)
BUN / CREAT RATIO: 15 (ref 12–28)
BUN: 26 mg/dL (ref 8–27)
CO2: 17 mmol/L — AB (ref 20–29)
CREATININE: 1.76 mg/dL — AB (ref 0.57–1.00)
Calcium: 8.7 mg/dL (ref 8.7–10.3)
Chloride: 94 mmol/L — ABNORMAL LOW (ref 96–106)
GFR calc Af Amer: 34 mL/min/{1.73_m2} — ABNORMAL LOW (ref >59)
GFR calc non Af Amer: 30 mL/min/{1.73_m2} — ABNORMAL LOW (ref >59)
Globulin, Total: 2.7 g/dL (ref 1.5–4.5)
Glucose: 186 mg/dL — ABNORMAL HIGH (ref 65–99)
Potassium: 4.7 mmol/L (ref 3.5–5.2)
SODIUM: 129 mmol/L — AB (ref 134–144)
Total Protein: 6.3 g/dL (ref 6.0–8.5)

## 2017-09-10 LAB — CBC WITH DIFFERENTIAL/PLATELET
BASOS ABS: 0 10*3/uL (ref 0.0–0.2)
Basos: 0 %
EOS (ABSOLUTE): 0.2 10*3/uL (ref 0.0–0.4)
Eos: 2 %
Hematocrit: 30.1 % — ABNORMAL LOW (ref 34.0–46.6)
Hemoglobin: 9.6 g/dL — ABNORMAL LOW (ref 11.1–15.9)
Immature Grans (Abs): 0 10*3/uL (ref 0.0–0.1)
Immature Granulocytes: 0 %
LYMPHS ABS: 2 10*3/uL (ref 0.7–3.1)
Lymphs: 17 %
MCH: 28.1 pg (ref 26.6–33.0)
MCHC: 31.9 g/dL (ref 31.5–35.7)
MCV: 88 fL (ref 79–97)
MONOCYTES: 8 %
Monocytes Absolute: 0.9 10*3/uL (ref 0.1–0.9)
NEUTROS ABS: 8.5 10*3/uL — AB (ref 1.4–7.0)
Neutrophils: 73 %
PLATELETS: 332 10*3/uL (ref 150–450)
RBC: 3.42 x10E6/uL — ABNORMAL LOW (ref 3.77–5.28)
RDW: 14.8 % (ref 12.3–15.4)
WBC: 11.7 10*3/uL — AB (ref 3.4–10.8)

## 2017-09-10 LAB — LIPASE: LIPASE: 71 U/L (ref 14–72)

## 2017-09-15 NOTE — Progress Notes (Signed)
09/16/2017 1:40 PM   Elizabeth Klein 1950/08/02 [id number]  Referring provider: Juline Patch, MD [address] [place], [place] [address]  Chief Complaint  Patient presents with  . Nephrolithiasis    HPI: Patient is a 67 year old [place] female who was referred by Dr. Juline Klein for nephrolithiasis with her son, Elizabeth Klein.     Patient presented to the ED on 09/06/2017 for LLQ pain associated with nausea.  Contrast CT on 09/06/2017 noted 2 x 3 mm stone in the left ureter at the L4-5 level with mild left hydroureteronephrosis.  Mild fullness of the right renal collecting system but no sign of obstructing abnormality.  Leiomyomas of the uterus.  Fatty liver.   Labs in the ED:  11.7 WBC's, 1.76 creatinine and > 50 RBC's   Meds given in the ED: Morphine, Vicodin, Zofran and Flomax   Prior urological history:  prior history of stones.  Stone composition is unknown.     Current NSAID/anticoagulation:   Daily ASA    Today, she passed the stone three days after she visited the ED.  She was unable to collect the fragment for analysis.  She has not had any pain since that time.  Patient denies any gross hematuria, dysuria or suprapubic/flank pain.  Patient denies any fevers, chills, nausea or vomiting.   Her UA is negative.     PMH: Past Medical History:  Diagnosis Date  . Acid reflux   . Chest pain   . Diabetes mellitus without complication ([place])   . High cholesterol   . Hypertension   . Kidney stones   . Migraines   . Psoriasis   . Rapid heart rate   . Weight loss     Surgical History: Past Surgical History:  Procedure Laterality Date  . BREAST BIOPSY Right    neg  . CESAREAN SECTION      Home Medications:  Allergies as of 09/16/2017   No Known Allergies     Medication List        Accurate as of 09/16/17  1:40 PM. Always use your most recent med list.          ACCU-CHEK FASTCLIX LANCETS Misc Use with accucheck machine to check BS up to 3 times  daily for DM2 ICD10 E11.9   acetaminophen 500 MG tablet Commonly known as:  TYLENOL Take 1,000 mg by mouth every 8 (eight) hours as needed.   aspirin EC 81 MG tablet Take 1 tablet (81 mg total) by mouth daily.   atorvastatin 80 MG tablet Commonly known as:  LIPITOR Take 1 tablet (80 mg total) by mouth daily.   Dulaglutide 0.75 MG/0.5ML Sopn Commonly known as:  TRULICITY Inject 0.03 mg into the skin once a week.   ENSTILAR 0.005-0.064 % Foam Generic drug:  Calcipotriene-Betameth Diprop Apply 1 application topically 2 (two) times daily.   fluocinonide ointment 0.05 % Commonly known as:  LIDEX Apply 1 application topically 2 (two) times daily.   glucose blood test strip Use to check BS with Accucheck Meter up to 3 times daily for DM2 E11.9   HYDROcodone-acetaminophen 5-325 MG tablet Commonly known as:  NORCO/VICODIN Take 0.5 tablets by mouth every 6 (six) hours as needed for severe pain.   irbesartan 300 MG tablet Commonly known as:  AVAPRO Take 1 tablet (300 mg total) by mouth daily.   LANTUS 100 UNIT/ML injection Generic drug:  insulin glargine INJECT SUBCUTANEOUSLY 40  UNITS EVERY NIGHT AT  BEDTIME  metFORMIN 1000 MG tablet Commonly known as:  GLUCOPHAGE Take 1 tablet (1,000 mg total) by mouth 2 (two) times daily with a meal.   metoprolol tartrate 50 MG tablet Commonly known as:  LOPRESSOR Take 1 tablet (50 mg total) by mouth 2 (two) times daily.   montelukast 10 MG tablet Commonly known as:  SINGULAIR Take 1 tablet (10 mg total) by mouth daily.   omeprazole 20 MG capsule Commonly known as:  PRILOSEC Take 1 capsule (20 mg total) by mouth daily.   ondansetron 4 MG disintegrating tablet Commonly known as:  ZOFRAN ODT Take 1 tablet (4 mg total) by mouth every 6 (six) hours as needed for nausea or vomiting.   Syringe (Disposable) 30 ML Misc Commonly known as:  B-D 30CC SYRINGE Use with Lantus Injections for DM2 ICD 10 E11.9   tamsulosin 0.4 MG Caps  capsule Commonly known as:  FLOMAX Take 1 capsule (0.4 mg total) by mouth daily.   traZODone 50 MG tablet Commonly known as:  DESYREL Take 1 tablet (50 mg total) by mouth at bedtime.       Allergies: No Known Allergies  Family History: Family History  Problem Relation Age of Onset  . Breast cancer Neg Hx   . Diabetes Brother   . Heart attack Brother   . Diabetes Brother   . Heart attack Brother   . Diabetes Brother     Social History:  reports that she has never smoked. She has never used smokeless tobacco. She reports that she does not drink alcohol or use drugs.  ROS: UROLOGY Frequent Urination?: Yes Hard to postpone urination?: No Burning/pain with urination?: No Get up at night to urinate?: No Leakage of urine?: No Urine stream starts and stops?: No Trouble starting stream?: No Do you have to strain to urinate?: No Blood in urine?: No Urinary tract infection?: No Sexually transmitted disease?: No Injury to kidneys or bladder?: No Painful intercourse?: No Weak stream?: No Currently pregnant?: No Vaginal bleeding?: No  Gastrointestinal Nausea?: No Vomiting?: No Indigestion/heartburn?: No Diarrhea?: No Constipation?: No  Constitutional Fever: No Night sweats?: Yes Weight loss?: No Fatigue?: No  Skin Skin rash/lesions?: Yes Itching?: No  Eyes Blurred vision?: No Double vision?: No  Ears/Nose/Throat Sore throat?: No Sinus problems?: No  Hematologic/Lymphatic Swollen glands?: No Easy bruising?: No  Cardiovascular Leg swelling?: No Chest pain?: No  Respiratory Cough?: No Shortness of breath?: No  Endocrine Excessive thirst?: No  Musculoskeletal Back pain?: No Joint pain?: No  Neurological Headaches?: Yes Dizziness?: No  Psychologic Depression?: No Anxiety?: No  Physical Exam: BP 115/75   Pulse (!) 106   Resp 16   Ht 4\' 11"  (1.499 m)   Wt 136 lb 9.6 oz (62 kg)   SpO2 96%   BMI 27.59 kg/m   Constitutional:  Well  nourished. Alert and oriented, No acute distress. HEENT: [place] AT, moist mucus membranes.  Trachea midline, no masses. Cardiovascular: No clubbing, cyanosis, or edema. Respiratory: Normal respiratory effort, no increased work of breathing. GI: Abdomen is soft, non tender, non distended, no abdominal masses. Liver and spleen not palpable.  No hernias appreciated.  Stool sample for occult testing is not indicated.   GU: No CVA tenderness.  No bladder fullness or masses.   Skin: No rashes, bruises or suspicious lesions. Lymph: No cervical or inguinal adenopathy. Neurologic: Grossly intact, no focal deficits, moving all 4 extremities. Psychiatric: Normal mood and affect.  Laboratory Data: Lab Results  Component Value Date   WBC  11.7 (H) 09/09/2017   HGB 9.6 (L) 09/09/2017   HCT 30.1 (L) 09/09/2017   MCV 88 09/09/2017   PLT 332 09/09/2017    Lab Results  Component Value Date   CREATININE 1.76 (H) 09/09/2017    No results found for: PSA  No results found for: TESTOSTERONE  Lab Results  Component Value Date   HGBA1C 9.4 (H) 07/01/2017    Lab Results  Component Value Date   TSH 1.900 03/19/2017       Component Value Date/Time   CHOL 135 07/01/2017 1544   HDL 43 07/01/2017 1544   CHOLHDL 3.1 07/01/2017 1544   LDLCALC 60 07/01/2017 1544    Lab Results  Component Value Date   AST 57 (H) 09/09/2017   Lab Results  Component Value Date   ALT 81 (H) 09/09/2017   No components found for: ALKALINEPHOPHATASE No components found for: BILIRUBINTOTAL  No results found for: ESTRADIOL  Urinalysis See HPI and Epic.   I have reviewed the labs.   Pertinent Imaging: CLINICAL DATA:  History of diabetes and kidney stones. Abdominal pain. Pain worse on the left.  EXAM: CT ABDOMEN AND PELVIS WITH CONTRAST  TECHNIQUE: Multidetector CT imaging of the abdomen and pelvis was performed using the standard protocol following bolus administration of intravenous  contrast.  CONTRAST:  41mL OMNIPAQUE IOHEXOL 300 MG/ML  SOLN  COMPARISON:  None.  FINDINGS: Lower chest: Mild basilar atelectasis or scarring.  Hepatobiliary: Fatty change of the liver. No focal hepatic lesion. No calcified gallstones. No ductal dilatation.  Pancreas: Normal appearance of the pancreas.  Spleen: Normal  Adrenals/Urinary Tract: Adrenal glands are normal. Renal parenchyma is normal on each side. One or 2 mm stone in the lower pole of the left kidney. Fullness of both renal collecting systems, more on the left than the right. 2 x 3 mm stone in the left ureter at the L4-5 level. No stone distal to that. Bladder appears normal.  Stomach/Bowel: No acute bowel pathology. Diverticulosis without evidence of diverticulitis.  Vascular/Lymphatic: Aortic atherosclerosis. No aneurysm. IVC is normal. No retroperitoneal adenopathy.  Reproductive: Multiple leiomyomas of the uterus, with variable calcification. No adnexal mass.  Other: No free fluid or air.  Musculoskeletal: Lower lumbar degenerative disease with degenerative spondylolisthesis at L5-S1.  IMPRESSION: 2 x 3 mm stone in the left ureter at the L4-5 level with mild left hydroureteronephrosis.  Mild fullness of the right renal collecting system but no sign of obstructing abnormality.  Leiomyomas of the uterus.  Fatty liver.   Electronically Signed   By: Nelson Chimes M.D.   On: 09/06/2017 21:22 I have independently reviewed the films.    Assessment & Plan:    1. Left ureteral stone Mostly likely passed  Will pursue 24 hour metabolic workup   2. Left hydronephrosis obtain RUS to ensure the hydronephrosis has resolved once they have passed and/or recovered from procedure to ensure to iatrogenic hydronephrosis remains - it is explained to the patient that it is important to document resolution of the hydronephrosis as "silent hydronephrosis" can occur and cause damage and/or loss of  the kidney  3. Microscopic hematuria UA today is negative  continue to monitor the patient's UA after the treatment/passage of the stone to ensure the hematuria has resolved if hematuria persists, we will pursue a hematuria workup with CT Urogram and cystoscopy if appropriate.    Return for I will call patient with results.  These notes generated with voice recognition software. I apologize for  typographical errors.  Zara Council, PA-C  [facility] [address]  [place], [place] [address] [phone]

## 2017-09-16 ENCOUNTER — Encounter

## 2017-09-16 ENCOUNTER — Ambulatory Visit: Payer: Medicare Other | Admitting: Urology

## 2017-09-16 ENCOUNTER — Encounter: Payer: Self-pay | Admitting: Urology

## 2017-09-16 ENCOUNTER — Telehealth: Payer: Self-pay | Admitting: Urology

## 2017-09-16 VITALS — BP 115/75 | HR 106 | Resp 16 | Ht 59.0 in | Wt 136.6 lb

## 2017-09-16 DIAGNOSIS — N201 Calculus of ureter: Secondary | ICD-10-CM

## 2017-09-16 DIAGNOSIS — R3129 Other microscopic hematuria: Secondary | ICD-10-CM

## 2017-09-16 DIAGNOSIS — N132 Hydronephrosis with renal and ureteral calculous obstruction: Secondary | ICD-10-CM

## 2017-09-16 LAB — MICROSCOPIC EXAMINATION
Bacteria, UA: NONE SEEN
RBC, UA: NONE SEEN /hpf (ref 0–2)
WBC UA: NONE SEEN /HPF (ref 0–5)

## 2017-09-16 LAB — URINALYSIS, COMPLETE
Bilirubin, UA: NEGATIVE
Glucose, UA: NEGATIVE
Ketones, UA: NEGATIVE
LEUKOCYTES UA: NEGATIVE
Nitrite, UA: NEGATIVE
PH UA: 5 (ref 5.0–7.5)
RBC, UA: NEGATIVE
Specific Gravity, UA: 1.02 (ref 1.005–1.030)
Urobilinogen, Ur: 0.2 mg/dL (ref 0.2–1.0)

## 2017-09-16 NOTE — Telephone Encounter (Signed)
Done

## 2017-09-16 NOTE — Patient Instructions (Signed)
[facility] Instructions [facility]  You will receive a box/kit in the mail that will have a urine jug and instructions in the kit.  When the box arrives you will need to call our office [phone] to schedule a LAB appointment.  You will need to do a 24hour urine and this should be done during the days that our office will be open.  For example any day from Sunday through Thursday.  How to collect the urine sample: On the day you start the urine sample this 1st morning urine should NOT be collected.  For the rest of the day including all night urines should be collected.  On the next morning the 1st urine should be collected and then you will be finished with the urine collections.  You will need to bring the box with you on your LAB appointment day after urine has been collected and all instructions are complete in the box.  Your blood will be drawn and the box will be collected by our Lab employee to be sent off for analysis.  When urine and blood is complete you will need to schedule a follow up appointment for lab results. 

## 2017-09-16 NOTE — Telephone Encounter (Signed)
Would you order a Litholink for Mrs. Mcquaig?

## 2017-09-18 LAB — CULTURE, URINE COMPREHENSIVE

## 2017-09-23 ENCOUNTER — Ambulatory Visit
Admission: RE | Admit: 2017-09-23 | Discharge: 2017-09-23 | Disposition: A | Discharge status: Home / Self Care | Payer: Medicare Other | Source: Ambulatory Visit | Attending: Urology | Admitting: Urology

## 2017-09-23 ENCOUNTER — Encounter (INDEPENDENT_AMBULATORY_CARE_PROVIDER_SITE_OTHER): Payer: Self-pay

## 2017-09-23 DIAGNOSIS — N132 Hydronephrosis with renal and ureteral calculous obstruction: Secondary | ICD-10-CM | POA: Insufficient documentation

## 2017-09-24 ENCOUNTER — Telehealth: Payer: Self-pay

## 2017-09-24 NOTE — Telephone Encounter (Signed)
-----   Message from Nori Riis, PA-C sent at 09/24/2017  8:53 AM EDT ----- Please let Elizabeth Klein know that her kidney ultrasound was normal.  We are waiting on Litholink results.

## 2017-09-24 NOTE — Telephone Encounter (Signed)
Pt informed, she states she still has not received her LithoLink kit yet.  

## 2017-10-03 ENCOUNTER — Other Ambulatory Visit: Payer: Self-pay

## 2017-10-03 MED ORDER — ONETOUCH DELICA PLUS LANCETS MISC: 1.0000 | Freq: Two times a day (BID) | 1 refills | Status: AC

## 2017-10-10 ENCOUNTER — Emergency Department
Admission: EM | Admit: 2017-10-10 | Discharge: 2017-10-10 | Disposition: A | Discharge status: Home / Self Care | Payer: Medicare Other | Attending: Emergency Medicine | Admitting: Emergency Medicine

## 2017-10-10 ENCOUNTER — Emergency Department: Payer: Medicare Other

## 2017-10-10 ENCOUNTER — Other Ambulatory Visit: Payer: Self-pay

## 2017-10-10 ENCOUNTER — Telehealth: Payer: Self-pay

## 2017-10-10 ENCOUNTER — Encounter: Payer: Self-pay | Admitting: Emergency Medicine

## 2017-10-10 DIAGNOSIS — R51 Headache: Secondary | ICD-10-CM | POA: Diagnosis not present

## 2017-10-10 DIAGNOSIS — Z79899 Other long term (current) drug therapy: Secondary | ICD-10-CM | POA: Insufficient documentation

## 2017-10-10 DIAGNOSIS — Z7982 Long term (current) use of aspirin: Secondary | ICD-10-CM | POA: Diagnosis not present

## 2017-10-10 DIAGNOSIS — E119 Type 2 diabetes mellitus without complications: Secondary | ICD-10-CM | POA: Insufficient documentation

## 2017-10-10 DIAGNOSIS — I129 Hypertensive chronic kidney disease with stage 1 through stage 4 chronic kidney disease, or unspecified chronic kidney disease: Secondary | ICD-10-CM | POA: Diagnosis not present

## 2017-10-10 DIAGNOSIS — R42 Dizziness and giddiness: Secondary | ICD-10-CM | POA: Insufficient documentation

## 2017-10-10 DIAGNOSIS — N183 Chronic kidney disease, stage 3 (moderate): Secondary | ICD-10-CM | POA: Insufficient documentation

## 2017-10-10 DIAGNOSIS — Z794 Long term (current) use of insulin: Secondary | ICD-10-CM | POA: Diagnosis not present

## 2017-10-10 LAB — URINALYSIS, COMPLETE (UACMP) WITH MICROSCOPIC
Bacteria, UA: NONE SEEN
Bilirubin Urine: NEGATIVE
GLUCOSE, UA: NEGATIVE mg/dL
Hgb urine dipstick: NEGATIVE
Ketones, ur: NEGATIVE mg/dL
Leukocytes, UA: NEGATIVE
NITRITE: NEGATIVE
PH: 5 (ref 5.0–8.0)
PROTEIN: NEGATIVE mg/dL
SPECIFIC GRAVITY, URINE: 1.011 (ref 1.005–1.030)

## 2017-10-10 LAB — BASIC METABOLIC PANEL
ANION GAP: 5 (ref 5–15)
Anion gap: 5 (ref 5–15)
BUN: 17 mg/dL (ref 8–23)
BUN: 15 mg/dL (ref 8–23)
CALCIUM: 9.6 mg/dL (ref 8.9–10.3)
CHLORIDE: 115 mmol/L — AB (ref 98–111)
CO2: 23 mmol/L (ref 22–32)
CO2: 21 mmol/L — ABNORMAL LOW (ref 22–32)
CREATININE: 0.93 mg/dL (ref 0.44–1.00)
Calcium: 8.7 mg/dL — ABNORMAL LOW (ref 8.9–10.3)
Chloride: 110 mmol/L (ref 98–111)
Creatinine, Ser: 0.83 mg/dL (ref 0.44–1.00)
GFR calc Af Amer: >60 mL/min (ref >60)
GFR calc Af Amer: >60 mL/min (ref >60)
GLUCOSE: 89 mg/dL (ref 70–99)
GLUCOSE: 72 mg/dL (ref 70–99)
POTASSIUM: 5 mmol/L (ref 3.5–5.1)
Potassium: 6.2 mmol/L — ABNORMAL HIGH (ref 3.5–5.1)
Sodium: 138 mmol/L (ref 135–145)
Sodium: 141 mmol/L (ref 135–145)

## 2017-10-10 LAB — CBC
HCT: 34.9 % — ABNORMAL LOW (ref 35.0–47.0)
HEMOGLOBIN: 11.5 g/dL — AB (ref 12.0–16.0)
MCH: 29.4 pg (ref 26.0–34.0)
MCHC: 32.9 g/dL (ref 32.0–36.0)
MCV: 89.3 fL (ref 80.0–100.0)
PLATELETS: 306 10*3/uL (ref 150–440)
RBC: 3.92 MIL/uL (ref 3.80–5.20)
RDW: 14.5 % (ref 11.5–14.5)
WBC: 9.4 10*3/uL (ref 3.6–11.0)

## 2017-10-10 MED ORDER — SODIUM CHLORIDE 0.9 % IV SOLN
1000.0000 mL | Freq: Once | INTRAVENOUS | Status: AC
  Administered 2017-10-10: 1000 mL via INTRAVENOUS

## 2017-10-10 NOTE — Telephone Encounter (Signed)
Advised ED and then Amy Pollak advised [place] and patient refused both. She called complaining of headaches and dizziness. Onset 3-4 days ago. She said she had at scan for headaches that was WNL few weeks ago. Explained that she is now having Dizziness and they need to see her soon for STAT labs and imaging. She disagreed and made appt for next week Tuesday 2pm

## 2017-10-10 NOTE — ED Triage Notes (Signed)
C/O intermittent headaches, dizziness, nausea for a while.  States symptoms come once or twice a day.  States last night felt sweaty and did not sleep well.  Today patient c/o headache and nausea.  Patient states she has been doing a lot of beadwork recently and her eyes "may be causing the headache"  AAOx3.  Skin warm and dry. NAD.  MAE equally and strong.  Facial movement equal. Speech equal.  Sensation intact and equal bilaterally.

## 2017-10-10 NOTE — ED Provider Notes (Signed)
[facility] Emergency Department Provider Note   ____________________________________________    I have reviewed the triage vital signs and the nursing notes.   HISTORY  Chief Complaint Dizziness     HPI Elizabeth Klein is a 67 y.o. female who presents with complaints of intermittent dizziness and occasional headache.  Patient reports this is been ongoing for about a week.  She reports she is to have migraines many years ago but not at all recently so headaches are unusual for her.  She denies chest pain or palpitations.  No fevers or chills.  No neck pain.  No trauma.  Not on blood thinners.  Currently she feels quite well and has no complaints.   Past Medical History:  Diagnosis Date  . Acid reflux   . Chest pain   . Diabetes mellitus without complication ([place])   . High cholesterol   . Hypertension   . Kidney stones   . Migraines   . Psoriasis   . Rapid heart rate   . Weight loss     Patient Active Problem List   Diagnosis Date Noted  . Microalbuminuria 04/30/2017  . CKD (chronic kidney disease) stage 3, GFR 30-59 ml/min ([facility]) 04/30/2017  . Positive depression screening 03/19/2017  . Uncontrolled type 2 diabetes mellitus with diabetic nephropathy ([place]) 03/19/2017  . Hypertension 03/19/2017  . CAD (coronary artery disease) 03/19/2017  . History of kidney stones 03/19/2017  . Psoriasis 03/19/2017  . Hyperlipidemia 03/19/2017  . Nocturnal leg cramps 03/19/2017  . Gait instability 03/19/2017  . Insomnia 03/19/2017  . Allergic rhinitis 03/19/2017    Past Surgical History:  Procedure Laterality Date  . BREAST BIOPSY Right    neg  . CESAREAN SECTION      Prior to Admission medications   Medication Sig Start Date End Date Taking? Authorizing Provider  acetaminophen (TYLENOL) 500 MG tablet Take 1,000 mg by mouth every 8 (eight) hours as needed.    [provider]  aspirin EC 81 MG tablet Take 1 tablet (81 mg total) by  mouth daily. 07/01/17   Juline Patch, MD  atorvastatin (LIPITOR) 80 MG tablet Take 1 tablet (80 mg total) by mouth daily. 07/01/17   Juline Patch, MD  Calcipotriene-Betameth Diprop (ENSTILAR) 0.005-0.064 % FOAM Apply 1 application topically 2 (two) times daily.    [provider]  Dulaglutide (TRULICITY) 5.63 JS/9.7WY SOPN Inject 0.75 mg into the skin once a week. 07/01/17   Juline Patch, MD  fluocinonide ointment (LIDEX) 6.37 % Apply 1 application topically 2 (two) times daily. 04/30/17   Plonk, Gwyndolyn Saxon, MD  glucose blood test strip Use to check BS with Accucheck Meter up to 3 times daily for DM2 E11.9 07/01/17   Juline Patch, MD  HYDROcodone-acetaminophen (NORCO/VICODIN) 5-325 MG tablet Take 0.5 tablets by mouth every 6 (six) hours as needed for severe pain. 09/09/17   Juline Patch, MD  irbesartan (AVAPRO) 300 MG tablet Take 1 tablet (300 mg total) by mouth daily. 07/01/17   Juline Patch, MD  LANTUS 100 UNIT/ML injection INJECT SUBCUTANEOUSLY 40  UNITS EVERY NIGHT AT  BEDTIME 08/22/17   Juline Patch, MD  metFORMIN (GLUCOPHAGE) 1000 MG tablet Take 1 tablet (1,000 mg total) by mouth 2 (two) times daily with a meal. 07/01/17   Juline Patch, MD  metoprolol tartrate (LOPRESSOR) 50 MG tablet Take 1 tablet (50 mg total) by mouth 2 (two) times daily. 07/01/17   Juline Patch,  MD  montelukast (SINGULAIR) 10 MG tablet Take 1 tablet (10 mg total) by mouth daily. 07/01/17   Juline Patch, MD  omeprazole (PRILOSEC) 20 MG capsule Take 1 capsule (20 mg total) by mouth daily. 07/01/17   Juline Patch, MD  ondansetron (ZOFRAN ODT) 4 MG disintegrating tablet Take 1 tablet (4 mg total) by mouth every 6 (six) hours as needed for nausea or vomiting. 09/06/17   Delman Kitten, MD  [facility] DELICA PLUS LANCETS MISC 1 each by Does not apply route 2 (two) times daily. 10/03/17   Glean Hess, MD  Syringe, Disposable, (B-D 30CC SYRINGE) 30 ML MISC Use with Lantus Injections for DM2 ICD 10 E11.9 07/01/17    Juline Patch, MD  tamsulosin (FLOMAX) 0.4 MG CAPS capsule Take 1 capsule (0.4 mg total) by mouth daily. 09/06/17   Delman Kitten, MD  traZODone (DESYREL) 50 MG tablet Take 1 tablet (50 mg total) by mouth at bedtime. 07/01/17   Juline Patch, MD     Allergies Patient has no known allergies.  Family History  Problem Relation Age of Onset  . Breast cancer Neg Hx   . Diabetes Brother   . Heart attack Brother   . Diabetes Brother   . Heart attack Brother   . Diabetes Brother     Social History Social History   Tobacco Use  . Smoking status: Never Smoker  . Smokeless tobacco: Never Used  Substance Use Topics  . Alcohol use: No    Frequency: Never  . Drug use: No    Review of Systems  Constitutional: Dizziness as above Eyes: No visual changes.  ENT: No sore throat. Cardiovascular: Denies chest pain. Respiratory: Denies shortness of breath. Gastrointestinal: No abdominal pain. Genitourinary: Negative for dysuria. Musculoskeletal: Negative for back pain. Skin: Negative for rash. Neurological: Negative for  weakness   ____________________________________________   PHYSICAL EXAM:  VITAL SIGNS: ED Triage Vitals  Enc Vitals Group     BP 10/10/17 1206 (!) 114/58     Pulse Rate 10/10/17 1206 80     Resp 10/10/17 1206 16     Temp 10/10/17 1206 98.5 F (36.9 C)     Temp src --      SpO2 10/10/17 1206 99 %     Weight 10/10/17 1207 61.7 kg (136 lb)     Height 10/10/17 1207 1.524 m (5')     Head Circumference --      Peak Flow --      Pain Score 10/10/17 1206 7     Pain Loc --      Pain Edu? --      Excl. in [place]? --     Constitutional: Alert and oriented. No acute distress. Pleasant and interactive Eyes: Conjunctivae are normal.  PERRLA Head: Atraumatic. Nose: No congestion/rhinnorhea. Mouth/Throat: Mucous membranes are moist.    Cardiovascular: Normal rate, regular rhythm. Grossly normal heart sounds.  Good peripheral circulation. Respiratory: Normal  respiratory effort.  No retractions. Lungs CTAB. Gastrointestinal: Soft and nontender. No distention.   Musculoskeletal: No lower extremity tenderness nor edema.  Warm and well perfused Neurologic:  Normal speech and language. No gross focal neurologic deficits are appreciated.  Skin:  Skin is warm, dry and intact. No rash noted. Psychiatric: Mood and affect are normal. Speech and behavior are normal.  ____________________________________________   LABS (all labs ordered are listed, but only abnormal results are displayed)  Labs Reviewed  BASIC METABOLIC PANEL - Abnormal; Notable for the following  components:      Result Value   Potassium 6.2 (*)    All other components within normal limits  CBC - Abnormal; Notable for the following components:   Hemoglobin 11.5 (*)    HCT 34.9 (*)    All other components within normal limits  URINALYSIS, COMPLETE (UACMP) WITH MICROSCOPIC - Abnormal; Notable for the following components:   Color, Urine YELLOW (*)    APPearance CLEAR (*)    All other components within normal limits  BASIC METABOLIC PANEL  CBG MONITORING, ED   ____________________________________________  EKG  ED ECG REPORT I, Lavonia Drafts, the attending physician, personally viewed and interpreted this ECG.  Date: 10/10/2017  Rhythm: normal sinus rhythm QRS Axis: normal Intervals: normal ST/T Wave abnormalities: normal Narrative Interpretation: no evidence of acute ischemia  ____________________________________________  RADIOLOGY  CT head normal ____________________________________________   PROCEDURES  Procedure(s) performed: No  Procedures   Critical Care performed: No ____________________________________________   INITIAL IMPRESSION / ASSESSMENT AND PLAN / ED COURSE  Pertinent labs & imaging results that were available during my care of the patient were reviewed by me and considered in my medical decision making (see chart for details).  She  well-appearing in no acute distress.  She notes that she wanted to go to her doctor's office but they did not have any available time today.  She has had intermittent dizziness for 1 week, intermittent headaches she thinks this may be due to eyestrain.  No weakness nausea or vomiting.  No trauma.  Work is overall quite reassuring.  She has an isolated elevated potassium but normal creatinine and BUN.  Review of medical records demonstrates she has had elevated potassiums in the past as well.  I suspect this is related to her Avapro  Will give IV fluids, recheck BMP  I have asked Dr. Kerman Passey to follow-up on recheck BMP, anticipate discharge as the patient has told me that she does not want to be admitted under any circumstances   ____________________________________________   FINAL CLINICAL IMPRESSION(S) / ED DIAGNOSES  Final diagnoses:  Dizziness        Note:  This document was prepared using Dragon voice recognition software and may include unintentional dictation errors.    Lavonia Drafts, MD 10/10/17 [phone]

## 2017-10-10 NOTE — ED Provider Notes (Signed)
-----------------------------------------   5:16 PM on 10/10/2017 -----------------------------------------  Patient's repeat potassium is now 5.0.  Overall the patient appears very well.  I discussed results with the patient.  We will discharge home with PCP follow-up.  Patient agreeable to plan of care.   Harvest Dark, MD 10/10/17 [phone]

## 2017-10-11 NOTE — Telephone Encounter (Signed)
Pt was seen in ED and will follow up with me as needed.

## 2017-10-13 ENCOUNTER — Ambulatory Visit: Payer: Medicare Other | Admitting: Internal Medicine

## 2017-10-13 ENCOUNTER — Encounter: Payer: Self-pay | Admitting: Internal Medicine

## 2017-10-13 VITALS — BP 112/64 | HR 65 | Ht 60.0 in | Wt 138.0 lb

## 2017-10-13 DIAGNOSIS — E1121 Type 2 diabetes mellitus with diabetic nephropathy: Secondary | ICD-10-CM | POA: Diagnosis not present

## 2017-10-13 DIAGNOSIS — I10 Essential (primary) hypertension: Secondary | ICD-10-CM | POA: Diagnosis not present

## 2017-10-13 DIAGNOSIS — I2510 Atherosclerotic heart disease of native coronary artery without angina pectoris: Secondary | ICD-10-CM | POA: Diagnosis not present

## 2017-10-13 DIAGNOSIS — R42 Dizziness and giddiness: Secondary | ICD-10-CM

## 2017-10-13 DIAGNOSIS — IMO0002 Reserved for concepts with insufficient information to code with codable children: Secondary | ICD-10-CM

## 2017-10-13 DIAGNOSIS — G44209 Tension-type headache, unspecified, not intractable: Secondary | ICD-10-CM

## 2017-10-13 DIAGNOSIS — E1165 Type 2 diabetes mellitus with hyperglycemia: Secondary | ICD-10-CM

## 2017-10-13 MED ORDER — MECLIZINE HCL 12.5 MG PO TABS: 12.5000 mg | ORAL_TABLET | Freq: Three times a day (TID) | ORAL | 0 refills | Status: DC | PRN

## 2017-10-13 MED ORDER — LOSARTAN POTASSIUM 50 MG PO TABS: 50.0000 mg | ORAL_TABLET | Freq: Every day | ORAL | 3 refills | Status: DC

## 2017-10-13 NOTE — Progress Notes (Signed)
Date:  10/13/2017   Name:  Elizabeth Klein   DOB:  [birth year]   MRN:  [id number]   Chief Complaint: Dizziness (ER f/up. Still having dizziness and mirgaine. Stated she had migrains and vertigo ten years ago. Was told at the hospital to stop irbesartan. Has not taking since then. Said she does not tak it for BP but only to protect kidneys. )  Dizziness  This is a new problem. The problem occurs 2 to 4 times per day. The problem has been unchanged. Associated symptoms include nausea. Pertinent negatives include no chest pain, chills, coughing, fatigue or fever. The symptoms are aggravated by twisting. She has tried nothing for the symptoms.  Migraine   Chronicity: current headache is mild and frontal associated with eye strain. The problem has been resolved. Associated symptoms include dizziness and nausea. Pertinent negatives include no coughing or fever. Her past medical history is significant for migraine headaches (has not had migraine in 30 years).     Review of Systems  Constitutional: Negative for chills, fatigue and fever.  Respiratory: Negative for cough, chest tightness, shortness of breath and wheezing.   Cardiovascular: Negative for chest pain, palpitations and leg swelling.  Gastrointestinal: Positive for nausea.  Genitourinary: Negative for dysuria.  Allergic/Immunologic: Positive for environmental allergies.  Neurological: Positive for dizziness.    Patient Active Problem List   Diagnosis Date Noted  . Microalbuminuria 04/30/2017  . CKD (chronic kidney disease) stage 3, GFR 30-59 ml/min (HCC) 04/30/2017  . Positive depression screening 03/19/2017  . Uncontrolled type 2 diabetes mellitus with diabetic nephropathy ([place]) 03/19/2017  . Hypertension 03/19/2017  . CAD (coronary artery disease) 03/19/2017  . History of kidney stones 03/19/2017  . Psoriasis 03/19/2017  . Hyperlipidemia 03/19/2017  . Nocturnal leg cramps 03/19/2017  . Gait instability 03/19/2017  .  Insomnia 03/19/2017  . Allergic rhinitis 03/19/2017    No Known Allergies  Past Surgical History:  Procedure Laterality Date  . BREAST BIOPSY Right    neg  . CESAREAN SECTION      Social History   Tobacco Use  . Smoking status: Never Smoker  . Smokeless tobacco: Never Used  Substance Use Topics  . Alcohol use: No    Frequency: Never  . Drug use: No     Medication list has been reviewed and updated.  Current Meds  Medication Sig  . acetaminophen (TYLENOL) 500 MG tablet Take 1,000 mg by mouth every 8 (eight) hours as needed.  Marland Kitchen aspirin EC 81 MG tablet Take 1 tablet (81 mg total) by mouth daily.  Marland Kitchen atorvastatin (LIPITOR) 80 MG tablet Take 1 tablet (80 mg total) by mouth daily.  . Calcipotriene-Betameth Diprop (ENSTILAR) 0.005-0.064 % FOAM Apply 1 application topically 2 (two) times daily.  . Dulaglutide (TRULICITY) 2.83 TD/1.7OH SOPN Inject 0.75 mg into the skin once a week. (Patient taking differently: Inject 1.5 mg into the skin once a week. )  . fluocinonide ointment (LIDEX) 6.07 % Apply 1 application topically 2 (two) times daily.  Marland Kitchen glimepiride (AMARYL) 2 MG tablet Take 1 tablet by mouth daily.  Marland Kitchen glucose blood test strip Use to check BS with Accucheck Meter up to 3 times daily for DM2 E11.9  . HYDROcodone-acetaminophen (NORCO/VICODIN) 5-325 MG tablet Take 0.5 tablets by mouth every 6 (six) hours as needed for severe pain.  Marland Kitchen LANTUS 100 UNIT/ML injection INJECT SUBCUTANEOUSLY 40  UNITS EVERY NIGHT AT  BEDTIME  . metFORMIN (GLUCOPHAGE) 1000 MG tablet Take 1 tablet (1,000 mg  total) by mouth 2 (two) times daily with a meal.  . metoprolol tartrate (LOPRESSOR) 50 MG tablet Take 1 tablet (50 mg total) by mouth 2 (two) times daily.  . montelukast (SINGULAIR) 10 MG tablet Take 1 tablet (10 mg total) by mouth daily.  Marland Kitchen omeprazole (PRILOSEC) 20 MG capsule Take 1 capsule (20 mg total) by mouth daily.  . ondansetron (ZOFRAN ODT) 4 MG disintegrating tablet Take 1 tablet (4 mg total)  by mouth every 6 (six) hours as needed for nausea or vomiting.  Glory Rosebush DELICA PLUS LANCETS MISC 1 each by Does not apply route 2 (two) times daily.  . Syringe, Disposable, (B-D 30CC SYRINGE) 30 ML MISC Use with Lantus Injections for DM2 ICD 10 E11.9  . tamsulosin (FLOMAX) 0.4 MG CAPS capsule Take 1 capsule (0.4 mg total) by mouth daily.  . traZODone (DESYREL) 50 MG tablet Take 1 tablet (50 mg total) by mouth at bedtime.    PHQ 2/9 Scores 09/09/2017 06/20/2017 03/19/2017  PHQ - 2 Score 1 1 2   PHQ- 9 Score 7 - 9    Physical Exam  Constitutional: She is oriented to person, place, and time. She appears well-developed. No distress.  HENT:  Head: Normocephalic and atraumatic.  External canal obscured by cerumen  Eyes: Pupils are equal, round, and reactive to light. Conjunctivae are normal. Right eye exhibits nystagmus. Left eye exhibits nystagmus.  Neck: Normal range of motion. Neck supple.  Cardiovascular: Normal rate, regular rhythm and normal heart sounds.  Pulmonary/Chest: Effort normal and breath sounds normal. No respiratory distress.  Musculoskeletal: Normal range of motion.  Neurological: She is alert and oriented to person, place, and time. She has normal strength. No cranial nerve deficit or sensory deficit.  Skin: Skin is warm and dry. No rash noted.  Psychiatric: She has a normal mood and affect. Her behavior is normal. Thought content normal.  Nursing note and vitals reviewed.   BP 112/64 (BP Location: Right Arm, Patient Position: Sitting, Cuff Size: Normal)   Pulse 65   Ht 5' (1.524 m)   Wt 138 lb (62.6 kg)   SpO2 99%   BMI 26.95 kg/m   Assessment and Plan: 1. Uncontrolled type 2 diabetes mellitus with diabetic nephropathy (HCC) Resume lower dose ARB - losartan (COZAAR) 50 MG tablet; Take 1 tablet (50 mg total) by mouth daily.  Dispense: 90 tablet; Refill: 3 - Ambulatory referral to Ophthalmology  2. Vertigo Should resolve over the next week May need to refer to  ENT - meclizine (ANTIVERT) 12.5 MG tablet; Take 1 tablet (12.5 mg total) by mouth 3 (three) times daily as needed for dizziness.  Dispense: 30 tablet; Refill: 0  3. Essential hypertension controlled  4. Coronary artery disease involving native coronary artery of native heart without angina pectoris Stable without angina  5. Tension-type headache, not intractable, unspecified chronicity pattern Not migrainous Could be due to eye strain - will refer  Meds ordered this encounter  Medications  . losartan (COZAAR) 50 MG tablet    Sig: Take 1 tablet (50 mg total) by mouth daily.    Dispense:  90 tablet    Refill:  3  . meclizine (ANTIVERT) 12.5 MG tablet    Sig: Take 1 tablet (12.5 mg total) by mouth 3 (three) times daily as needed for dizziness.    Dispense:  30 tablet    Refill:  0    Partially dictated using Editor, commissioning. Any errors are unintentional.  Halina Maidens, MD [facility]  [facility]  10/13/2017

## 2017-11-03 ENCOUNTER — Other Ambulatory Visit: Payer: Self-pay | Admitting: Family Medicine

## 2017-11-03 DIAGNOSIS — K219 Gastro-esophageal reflux disease without esophagitis: Secondary | ICD-10-CM

## 2017-11-03 DIAGNOSIS — IMO0002 Reserved for concepts with insufficient information to code with codable children: Secondary | ICD-10-CM

## 2017-11-03 DIAGNOSIS — I2510 Atherosclerotic heart disease of native coronary artery without angina pectoris: Secondary | ICD-10-CM

## 2017-11-03 DIAGNOSIS — E1140 Type 2 diabetes mellitus with diabetic neuropathy, unspecified: Secondary | ICD-10-CM

## 2017-11-03 DIAGNOSIS — E1121 Type 2 diabetes mellitus with diabetic nephropathy: Secondary | ICD-10-CM

## 2017-11-03 DIAGNOSIS — G4700 Insomnia, unspecified: Secondary | ICD-10-CM

## 2017-11-03 DIAGNOSIS — I10 Essential (primary) hypertension: Secondary | ICD-10-CM

## 2017-11-03 DIAGNOSIS — J309 Allergic rhinitis, unspecified: Secondary | ICD-10-CM

## 2017-11-03 DIAGNOSIS — E1165 Type 2 diabetes mellitus with hyperglycemia: Secondary | ICD-10-CM

## 2017-11-10 DIAGNOSIS — L400 Psoriasis vulgaris: Secondary | ICD-10-CM | POA: Diagnosis not present

## 2017-11-10 DIAGNOSIS — L281 Prurigo nodularis: Secondary | ICD-10-CM | POA: Diagnosis not present

## 2017-11-21 DIAGNOSIS — E113292 Type 2 diabetes mellitus with mild nonproliferative diabetic retinopathy without macular edema, left eye: Secondary | ICD-10-CM | POA: Diagnosis not present

## 2017-11-21 LAB — HM DIABETES EYE EXAM

## 2017-12-03 ENCOUNTER — Encounter: Payer: Self-pay | Admitting: Internal Medicine

## 2018-01-19 DIAGNOSIS — I10 Essential (primary) hypertension: Secondary | ICD-10-CM | POA: Diagnosis not present

## 2018-01-19 DIAGNOSIS — E1159 Type 2 diabetes mellitus with other circulatory complications: Secondary | ICD-10-CM | POA: Diagnosis not present

## 2018-01-19 DIAGNOSIS — Z794 Long term (current) use of insulin: Secondary | ICD-10-CM | POA: Diagnosis not present

## 2018-01-19 DIAGNOSIS — E1142 Type 2 diabetes mellitus with diabetic polyneuropathy: Secondary | ICD-10-CM | POA: Diagnosis not present

## 2018-01-19 DIAGNOSIS — E1169 Type 2 diabetes mellitus with other specified complication: Secondary | ICD-10-CM | POA: Diagnosis not present

## 2018-01-19 LAB — HEMOGLOBIN A1C: Hgb A1c MFr Bld: 8.2 — AB (ref 4.0–6.0)

## 2018-01-27 ENCOUNTER — Ambulatory Visit (INDEPENDENT_AMBULATORY_CARE_PROVIDER_SITE_OTHER): Payer: Medicare Other | Admitting: Internal Medicine

## 2018-01-27 ENCOUNTER — Encounter: Payer: Self-pay | Admitting: Internal Medicine

## 2018-01-27 VITALS — BP 114/70 | HR 76 | Ht 60.0 in | Wt 146.0 lb

## 2018-01-27 DIAGNOSIS — I10 Essential (primary) hypertension: Secondary | ICD-10-CM | POA: Diagnosis not present

## 2018-01-27 DIAGNOSIS — B373 Candidiasis of vulva and vagina: Secondary | ICD-10-CM

## 2018-01-27 DIAGNOSIS — E785 Hyperlipidemia, unspecified: Secondary | ICD-10-CM

## 2018-01-27 DIAGNOSIS — E1165 Type 2 diabetes mellitus with hyperglycemia: Secondary | ICD-10-CM

## 2018-01-27 DIAGNOSIS — IMO0002 Reserved for concepts with insufficient information to code with codable children: Secondary | ICD-10-CM

## 2018-01-27 DIAGNOSIS — E1121 Type 2 diabetes mellitus with diabetic nephropathy: Secondary | ICD-10-CM | POA: Diagnosis not present

## 2018-01-27 DIAGNOSIS — B3731 Acute candidiasis of vulva and vagina: Secondary | ICD-10-CM

## 2018-01-27 DIAGNOSIS — L409 Psoriasis, unspecified: Secondary | ICD-10-CM

## 2018-01-27 DIAGNOSIS — E1169 Type 2 diabetes mellitus with other specified complication: Secondary | ICD-10-CM

## 2018-01-27 NOTE — Progress Notes (Signed)
Date:  01/27/2018   Name:  Elizabeth Klein   DOB:  03/21/1950   MRN:  [id number]   Chief Complaint: Hypertension and Diabetes (Follow up. Seen Endocrin doctor at [facility] for Endo 01/19/2018. Glimeperide was increased and lantus was cut down. A1C was 8.2.)  Hypertension  This is a chronic problem. The problem is controlled. Pertinent negatives include no headaches or shortness of breath. Past treatments include angiotensin blockers. Hypertensive end-organ damage includes retinopathy.  Diabetes  She presents for her follow-up diabetic visit. She has type 2 diabetes mellitus. Her disease course has been stable. Pertinent negatives for hypoglycemia include no dizziness, headaches or nervousness/anxiousness. Hypoglycemia complications include required assistance. Diabetic complications include retinopathy. Current diabetic treatment includes oral agent (dual therapy) and insulin injections (Trulicity).  Rash  This is a chronic problem. The problem has been waxing and waning since onset. The affected locations include the scalp, right hand, left hand and torso. Pertinent negatives include no cough or shortness of breath. (Psoriasis)  Hyperlipidemia  This is a chronic problem. The problem is controlled. Exacerbating diseases include diabetes. Pertinent negatives include no shortness of breath. Current antihyperlipidemic treatment includes statins. The current treatment provides significant improvement of lipids.  Vaginitis - pt has sx of vaginal candida.  Itching, esp at night, white thick discharge.  No bleeding or pain.  Not taking SGLT-2 meds or recent antibiotics.  Lab Results  Component Value Date   HGBA1C 8.2 (A) 01/19/2018   Lab Results  Component Value Date   CREATININE 0.83 10/10/2017   BUN 15 10/10/2017   NA 141 10/10/2017   K 5.0 10/10/2017   CL 115 (H) 10/10/2017   CO2 21 (L) 10/10/2017   Lab Results  Component Value Date   CHOL 135 07/01/2017   HDL 43 07/01/2017   LDLCALC  60 07/01/2017   TRIG 159 (H) 07/01/2017   CHOLHDL 3.1 07/01/2017     Review of Systems  Constitutional: Negative for chills, diaphoresis and unexpected weight change.  HENT: Negative for ear discharge (itching exernal ears) and trouble swallowing.   Respiratory: Negative for cough, chest tightness, shortness of breath and wheezing.   Cardiovascular: Negative for leg swelling.  Gastrointestinal: Negative for abdominal pain and blood in stool.  Genitourinary: Positive for vaginal discharge (and itching).  Musculoskeletal: Negative for arthralgias.  Skin: Positive for rash.  Neurological: Negative for dizziness and headaches.  Psychiatric/Behavioral: Negative for decreased concentration and sleep disturbance. The patient is not nervous/anxious.     Patient Active Problem List   Diagnosis Date Noted  . Microalbuminuria 04/30/2017  . CKD (chronic kidney disease) stage 3, GFR 30-59 ml/min (HCC) 04/30/2017  . Positive depression screening 03/19/2017  . Uncontrolled type 2 diabetes mellitus with diabetic nephropathy ([place]) 03/19/2017  . Hypertension 03/19/2017  . CAD (coronary artery disease) 03/19/2017  . History of kidney stones 03/19/2017  . Psoriasis 03/19/2017  . Hyperlipidemia 03/19/2017  . Nocturnal leg cramps 03/19/2017  . Gait instability 03/19/2017  . Insomnia 03/19/2017  . Allergic rhinitis 03/19/2017    No Known Allergies  Past Surgical History:  Procedure Laterality Date  . BREAST BIOPSY Right    neg  . CESAREAN SECTION      Social History   Tobacco Use  . Smoking status: Never Smoker  . Smokeless tobacco: Never Used  Substance Use Topics  . Alcohol use: No    Frequency: Never  . Drug use: No     Medication list has been reviewed and updated.  Current  Meds  Medication Sig  . acetaminophen (TYLENOL) 500 MG tablet Take 1,000 mg by mouth every 8 (eight) hours as needed.  Marland Kitchen aspirin EC 81 MG tablet Take 1 tablet (81 mg total) by mouth daily.  Marland Kitchen  atorvastatin (LIPITOR) 80 MG tablet Take 1 tablet (80 mg total) by mouth daily.  . Calcipotriene-Betameth Diprop (ENSTILAR) 0.005-0.064 % FOAM Apply 1 application topically 2 (two) times daily.  . Dulaglutide (TRULICITY) 6.83 MH/9.6QI SOPN Inject 0.75 mg into the skin once a week. (Patient taking differently: Inject 1.5 mg into the skin once a week. )  . fluocinonide ointment (LIDEX) 2.97 % Apply 1 application topically 2 (two) times daily.  Marland Kitchen glimepiride (AMARYL) 2 MG tablet Take 1 tablet by mouth 2 (two) times daily.   Marland Kitchen glucose blood test strip Use to check BS with Accucheck Meter up to 3 times daily for DM2 E11.9  . HYDROcodone-acetaminophen (NORCO/VICODIN) 5-325 MG tablet Take 0.5 tablets by mouth every 6 (six) hours as needed for severe pain.  Marland Kitchen LANTUS 100 UNIT/ML injection INJECT 40 UNITS  SUBCUTANEOUSLY EVERY NIGHT  AT BEDTIME (Patient taking differently: Inject 40 Units into the skin every morning. )  . losartan (COZAAR) 50 MG tablet Take 1 tablet (50 mg total) by mouth daily.  . meclizine (ANTIVERT) 12.5 MG tablet Take 1 tablet (12.5 mg total) by mouth 3 (three) times daily as needed for dizziness.  . metFORMIN (GLUCOPHAGE) 1000 MG tablet TAKE 1 TABLET BY MOUTH TWO  TIMES DAILY WITH A MEAL  . metoprolol tartrate (LOPRESSOR) 50 MG tablet TAKE 1 TABLET BY MOUTH TWO  TIMES DAILY  . montelukast (SINGULAIR) 10 MG tablet TAKE 1 TABLET BY MOUTH  DAILY  . omeprazole (PRILOSEC) 20 MG capsule TAKE 1 CAPSULE BY MOUTH  DAILY  . ondansetron (ZOFRAN ODT) 4 MG disintegrating tablet Take 1 tablet (4 mg total) by mouth every 6 (six) hours as needed for nausea or vomiting.  Glory Rosebush DELICA PLUS LANCETS MISC 1 each by Does not apply route 2 (two) times daily.  . Syringe, Disposable, (B-D 30CC SYRINGE) 30 ML MISC Use with Lantus Injections for DM2 ICD 10 E11.9  . tamsulosin (FLOMAX) 0.4 MG CAPS capsule Take 1 capsule (0.4 mg total) by mouth daily.  . traZODone (DESYREL) 50 MG tablet TAKE 1 TABLET BY MOUTH  AT  BEDTIME    PHQ 2/9 Scores 09/09/2017 06/20/2017 03/19/2017  PHQ - 2 Score 1 1 2   PHQ- 9 Score 7 - 9    Physical Exam  Constitutional: She is oriented to person, place, and time. She appears well-developed. No distress.  HENT:  Head: Normocephalic and atraumatic.  Neck: Normal range of motion. Neck supple.  Cardiovascular: Normal rate, regular rhythm and normal heart sounds.  Pulmonary/Chest: Effort normal and breath sounds normal. No respiratory distress.  Musculoskeletal: Normal range of motion.  Lymphadenopathy:    She has no cervical adenopathy.  Neurological: She is alert and oriented to person, place, and time.  Skin: Skin is warm and dry. No rash noted.  Psoriatic lesions on frontal scalp, hands, upper back  Psychiatric: She has a normal mood and affect. Her behavior is normal. Thought content normal.  Nursing note and vitals reviewed.   BP 114/70 (BP Location: Right Arm, Patient Position: Sitting, Cuff Size: Normal)   Pulse 76   Ht 5' (1.524 m)   Wt 146 lb (66.2 kg)   BMI 28.51 kg/m   Assessment and Plan: 1. Essential hypertension controlled - CBC with Differential/Platelet  2. Uncontrolled type 2 diabetes mellitus with diabetic nephropathy (HCC) Improved, followed by Endo - Comprehensive metabolic panel  3. Yeast vaginitis Recommend miconazole otc cream vaginally and externally at HS  4. Psoriasis Continue current topical treatment  5. Hyperlipidemia associated with type 2 diabetes mellitus ([place]) On statin and doing well   Partially dictated using Editor, commissioning. Any errors are unintentional.  Halina Maidens, MD [facility]  01/27/2018

## 2018-01-27 NOTE — Patient Instructions (Signed)
Miconazole vaginal cream - use nightly for up to one week if needed

## 2018-01-28 LAB — CBC WITH DIFFERENTIAL/PLATELET
BASOS: 1 %
Basophils Absolute: 0.1 10*3/uL (ref 0.0–0.2)
EOS (ABSOLUTE): 0.4 10*3/uL (ref 0.0–0.4)
Eos: 4 %
Hematocrit: 35.9 % (ref 34.0–46.6)
Hemoglobin: 11.4 g/dL (ref 11.1–15.9)
IMMATURE GRANS (ABS): 0 10*3/uL (ref 0.0–0.1)
IMMATURE GRANULOCYTES: 0 %
Lymphocytes Absolute: 3.8 10*3/uL — ABNORMAL HIGH (ref 0.7–3.1)
Lymphs: 35 %
MCH: 28.2 pg (ref 26.6–33.0)
MCHC: 31.8 g/dL (ref 31.5–35.7)
MCV: 89 fL (ref 79–97)
Monocytes Absolute: 0.8 10*3/uL (ref 0.1–0.9)
Monocytes: 7 %
Neutrophils Absolute: 5.8 10*3/uL (ref 1.4–7.0)
Neutrophils: 53 %
PLATELETS: 353 10*3/uL (ref 150–450)
RBC: 4.04 x10E6/uL (ref 3.77–5.28)
RDW: 13 % (ref 12.3–15.4)
WBC: 10.9 10*3/uL — ABNORMAL HIGH (ref 3.4–10.8)

## 2018-01-28 LAB — COMPREHENSIVE METABOLIC PANEL
A/G RATIO: 1.3 (ref 1.2–2.2)
ALBUMIN: 4.1 g/dL (ref 3.6–4.8)
ALT: 60 IU/L — ABNORMAL HIGH (ref 0–32)
AST: 35 IU/L (ref 0–40)
Alkaline Phosphatase: 260 IU/L — ABNORMAL HIGH (ref 39–117)
BILIRUBIN TOTAL: 0.2 mg/dL (ref 0.0–1.2)
BUN / CREAT RATIO: 22 (ref 12–28)
BUN: 20 mg/dL (ref 8–27)
CHLORIDE: 109 mmol/L — AB (ref 96–106)
CO2: 17 mmol/L — ABNORMAL LOW (ref 20–29)
Calcium: 9.5 mg/dL (ref 8.7–10.3)
Creatinine, Ser: 0.91 mg/dL (ref 0.57–1.00)
GFR calc non Af Amer: 66 mL/min/{1.73_m2} (ref >59)
GFR, EST AFRICAN AMERICAN: 77 mL/min/{1.73_m2} (ref >59)
Globulin, Total: 3.1 g/dL (ref 1.5–4.5)
Glucose: 248 mg/dL — ABNORMAL HIGH (ref 65–99)
POTASSIUM: 5.6 mmol/L — AB (ref 3.5–5.2)
Sodium: 141 mmol/L (ref 134–144)
Total Protein: 7.2 g/dL (ref 6.0–8.5)

## 2018-01-30 NOTE — Progress Notes (Signed)
Patient informed. Mailed her a copy as she requested and she will follow up with Endo for her BS.

## 2018-02-09 ENCOUNTER — Telehealth: Payer: Self-pay | Admitting: Internal Medicine

## 2018-02-09 NOTE — Telephone Encounter (Signed)
Patient was informed that her lab results were to be sent in the mail.

## 2018-02-09 NOTE — Telephone Encounter (Signed)
Who informed the patient of this? Is the patient asking that I mail them? I can do this if she needs me to but we always call about results as well. I can always do both if she would like me to. Thanks.

## 2018-02-24 ENCOUNTER — Ambulatory Visit: Payer: Medicare Other | Admitting: Internal Medicine

## 2018-02-24 ENCOUNTER — Encounter: Payer: Self-pay | Admitting: Internal Medicine

## 2018-02-24 ENCOUNTER — Other Ambulatory Visit: Payer: Self-pay | Admitting: Internal Medicine

## 2018-02-24 ENCOUNTER — Ambulatory Visit (INDEPENDENT_AMBULATORY_CARE_PROVIDER_SITE_OTHER): Payer: Medicare Other | Admitting: Internal Medicine

## 2018-02-24 VITALS — BP 118/80 | HR 78 | Ht 60.0 in | Wt 147.0 lb

## 2018-02-24 DIAGNOSIS — L409 Psoriasis, unspecified: Secondary | ICD-10-CM | POA: Diagnosis not present

## 2018-02-24 NOTE — Patient Instructions (Signed)
Avoid covering your scalp tightly to limit overheating.  Apply both topical medications twice a day.

## 2018-02-24 NOTE — Progress Notes (Signed)
Date:  02/24/2018   Name:  Elizabeth Klein   DOB:  1951-01-16   MRN:  [id number]   Chief Complaint: Rash (Rash on scalp- itchy. Started years ago. Seen skin doctor in the past. )  Rash  This is a chronic problem. The current episode started more than 1 year ago. The problem has been gradually worsening since onset. The affected locations include the scalp, left hand and right hand. The rash is characterized by redness and itchiness. She was exposed to nothing. Pertinent negatives include no cough, fatigue, fever or shortness of breath. Past treatments include topical steroids (seen by Dermatology ).    Review of Systems  Constitutional: Negative for chills, fatigue and fever.  Respiratory: Negative for cough, shortness of breath and wheezing.   Cardiovascular: Negative for chest pain and palpitations.  Skin: Positive for color change and rash.  Neurological: Negative for dizziness.    Patient Active Problem List   Diagnosis Date Noted  . Microalbuminuria 04/30/2017  . CKD (chronic kidney disease) stage 3, GFR 30-59 ml/min (HCC) 04/30/2017  . Positive depression screening 03/19/2017  . Uncontrolled type 2 diabetes mellitus with diabetic nephropathy ([place]) 03/19/2017  . Hypertension 03/19/2017  . CAD (coronary artery disease) 03/19/2017  . History of kidney stones 03/19/2017  . Psoriasis 03/19/2017  . Hyperlipidemia associated with type 2 diabetes mellitus ([place]) 03/19/2017  . Nocturnal leg cramps 03/19/2017  . Gait instability 03/19/2017  . Insomnia 03/19/2017  . Allergic rhinitis 03/19/2017    No Known Allergies  Past Surgical History:  Procedure Laterality Date  . BREAST BIOPSY Right    neg  . CESAREAN SECTION      Social History   Tobacco Use  . Smoking status: Never Smoker  . Smokeless tobacco: Never Used  Substance Use Topics  . Alcohol use: No    Frequency: Never  . Drug use: No     Medication list has been reviewed and updated.  Current Meds    Medication Sig  . acetaminophen (TYLENOL) 500 MG tablet Take 1,000 mg by mouth every 8 (eight) hours as needed.  Marland Kitchen aspirin EC 81 MG tablet Take 1 tablet (81 mg total) by mouth daily.  Marland Kitchen atorvastatin (LIPITOR) 80 MG tablet Take 1 tablet (80 mg total) by mouth daily.  . Calcipotriene-Betameth Diprop (ENSTILAR) 0.005-0.064 % FOAM Apply 1 application topically 2 (two) times daily.  . Dulaglutide (TRULICITY) 2.62 MB/5.5HR SOPN Inject 0.75 mg into the skin once a week. (Patient taking differently: Inject 1.5 mg into the skin once a week. )  . glimepiride (AMARYL) 4 MG tablet Take 1 tablet by mouth daily with breakfast.   . glucose blood test strip Use to check BS with Accucheck Meter up to 3 times daily for DM2 E11.9  . Halobetasol Prop-Tazarotene (DUOBRII) 0.01-0.045 % LOTN Apply topically 2 (two) times daily.  Marland Kitchen LANTUS 100 UNIT/ML injection INJECT 40 UNITS  SUBCUTANEOUSLY EVERY NIGHT  AT BEDTIME (Patient taking differently: Inject 40 Units into the skin every morning. )  . losartan (COZAAR) 50 MG tablet Take 1 tablet (50 mg total) by mouth daily.  . meclizine (ANTIVERT) 12.5 MG tablet Take 1 tablet (12.5 mg total) by mouth 3 (three) times daily as needed for dizziness.  . metFORMIN (GLUCOPHAGE) 1000 MG tablet TAKE 1 TABLET BY MOUTH TWO  TIMES DAILY WITH A MEAL  . metoprolol tartrate (LOPRESSOR) 50 MG tablet TAKE 1 TABLET BY MOUTH TWO  TIMES DAILY  . montelukast (SINGULAIR) 10 MG tablet TAKE  1 TABLET BY MOUTH  DAILY  . omeprazole (PRILOSEC) 20 MG capsule TAKE 1 CAPSULE BY MOUTH  DAILY  . ONETOUCH DELICA PLUS LANCETS MISC 1 each by Does not apply route 2 (two) times daily.  . Syringe, Disposable, (B-D 30CC SYRINGE) 30 ML MISC Use with Lantus Injections for DM2 ICD 10 E11.9  . traZODone (DESYREL) 50 MG tablet TAKE 1 TABLET BY MOUTH AT  BEDTIME    PHQ 2/9 Scores 09/09/2017 06/20/2017 03/19/2017  PHQ - 2 Score 1 1 2   PHQ- 9 Score 7 - 9    Physical Exam Vitals signs and nursing note reviewed.   Constitutional:      General: She is not in acute distress.    Appearance: She is well-developed.  HENT:     Head: Normocephalic and atraumatic.  Neck:     Musculoskeletal: Normal range of motion and neck supple.  Cardiovascular:     Rate and Rhythm: Normal rate and regular rhythm.  Pulmonary:     Effort: Pulmonary effort is normal. No respiratory distress.     Breath sounds: Normal breath sounds. No wheezing.  Musculoskeletal: Normal range of motion.  Lymphadenopathy:     Cervical: No cervical adenopathy.  Skin:    General: Skin is warm and dry.     Findings: Rash present.     Comments: Several raised excoriated lesions at the front of the scalp just in the hairline No hair loss or alopecia noted  Neurological:     Mental Status: She is alert and oriented to person, place, and time.  Psychiatric:        Behavior: Behavior normal.        Thought Content: Thought content normal.     BP 118/80 (BP Location: Right Arm, Patient Position: Sitting, Cuff Size: Normal)   Pulse 78   Ht 5' (1.524 m)   Wt 147 lb (66.7 kg)   SpO2 97%   BMI 28.71 kg/m   Assessment and Plan: 1. Psoriasis Avoid scratching Apply both topical medications twice a day Follow up with Dr Phillip Heal if sx are worsening   Partially dictated using Dragon software. Any errors are unintentional.  Halina Maidens, MD [facility]  02/24/2018

## 2018-03-09 ENCOUNTER — Other Ambulatory Visit: Payer: Self-pay | Admitting: Family Medicine

## 2018-03-09 DIAGNOSIS — E785 Hyperlipidemia, unspecified: Secondary | ICD-10-CM

## 2018-04-08 ENCOUNTER — Other Ambulatory Visit: Payer: Self-pay | Admitting: Family Medicine

## 2018-04-08 DIAGNOSIS — E785 Hyperlipidemia, unspecified: Secondary | ICD-10-CM

## 2018-04-20 ENCOUNTER — Other Ambulatory Visit: Payer: Self-pay

## 2018-04-20 DIAGNOSIS — E785 Hyperlipidemia, unspecified: Secondary | ICD-10-CM

## 2018-04-20 MED ORDER — ATORVASTATIN CALCIUM 80 MG PO TABS: 80.0000 mg | ORAL_TABLET | Freq: Every day | ORAL | 1 refills | Status: DC

## 2018-04-23 ENCOUNTER — Other Ambulatory Visit: Payer: Self-pay

## 2018-04-23 DIAGNOSIS — E785 Hyperlipidemia, unspecified: Secondary | ICD-10-CM

## 2018-04-23 MED ORDER — GLIMEPIRIDE 4 MG PO TABS: 4.0000 mg | ORAL_TABLET | Freq: Every day | ORAL | 1 refills | Status: DC

## 2018-04-23 MED ORDER — ATORVASTATIN CALCIUM 80 MG PO TABS: 80.0000 mg | ORAL_TABLET | Freq: Every day | ORAL | 1 refills | Status: DC

## 2018-05-03 ENCOUNTER — Other Ambulatory Visit: Payer: Self-pay | Admitting: Internal Medicine

## 2018-05-03 DIAGNOSIS — I2510 Atherosclerotic heart disease of native coronary artery without angina pectoris: Secondary | ICD-10-CM

## 2018-05-03 DIAGNOSIS — I10 Essential (primary) hypertension: Secondary | ICD-10-CM

## 2018-06-01 DIAGNOSIS — Z794 Long term (current) use of insulin: Secondary | ICD-10-CM | POA: Diagnosis not present

## 2018-06-01 DIAGNOSIS — I10 Essential (primary) hypertension: Secondary | ICD-10-CM | POA: Diagnosis not present

## 2018-06-01 DIAGNOSIS — E1159 Type 2 diabetes mellitus with other circulatory complications: Secondary | ICD-10-CM | POA: Diagnosis not present

## 2018-06-01 DIAGNOSIS — E1142 Type 2 diabetes mellitus with diabetic polyneuropathy: Secondary | ICD-10-CM | POA: Diagnosis not present

## 2018-06-01 DIAGNOSIS — E1169 Type 2 diabetes mellitus with other specified complication: Secondary | ICD-10-CM | POA: Diagnosis not present

## 2018-06-01 LAB — HEMOGLOBIN A1C: Hemoglobin A1C: 8.6

## 2018-06-01 LAB — HM DIABETES FOOT EXAM

## 2018-07-04 ENCOUNTER — Other Ambulatory Visit: Payer: Self-pay | Admitting: Internal Medicine

## 2018-07-04 DIAGNOSIS — G4700 Insomnia, unspecified: Secondary | ICD-10-CM

## 2018-07-23 ENCOUNTER — Encounter: Payer: Medicare Other | Admitting: Internal Medicine

## 2018-07-29 ENCOUNTER — Other Ambulatory Visit: Payer: Self-pay | Admitting: Internal Medicine

## 2018-07-29 DIAGNOSIS — E1140 Type 2 diabetes mellitus with diabetic neuropathy, unspecified: Secondary | ICD-10-CM

## 2018-07-29 DIAGNOSIS — E1121 Type 2 diabetes mellitus with diabetic nephropathy: Secondary | ICD-10-CM

## 2018-07-29 DIAGNOSIS — J309 Allergic rhinitis, unspecified: Secondary | ICD-10-CM

## 2018-07-29 DIAGNOSIS — IMO0002 Reserved for concepts with insufficient information to code with codable children: Secondary | ICD-10-CM

## 2018-08-10 ENCOUNTER — Telehealth: Payer: Self-pay | Admitting: Internal Medicine

## 2018-08-10 ENCOUNTER — Other Ambulatory Visit: Payer: Self-pay

## 2018-08-10 ENCOUNTER — Other Ambulatory Visit: Payer: Self-pay | Admitting: Internal Medicine

## 2018-08-10 DIAGNOSIS — L409 Psoriasis, unspecified: Secondary | ICD-10-CM

## 2018-08-10 DIAGNOSIS — E1140 Type 2 diabetes mellitus with diabetic neuropathy, unspecified: Secondary | ICD-10-CM

## 2018-08-10 MED ORDER — BD SYRINGE 30 ML MISC: 12 refills | Status: DC

## 2018-08-10 MED ORDER — DUOBRII 0.01-0.045 % EX LOTN
1.0000 | TOPICAL_LOTION | Freq: Two times a day (BID) | CUTANEOUS | 1 refills | Status: DC
Start: 2018-08-10 — End: 2019-12-08

## 2018-08-10 NOTE — Telephone Encounter (Signed)
Patient informed of this. Told her if it needs a PA, we will need to refer her to a dermatologist.

## 2018-08-10 NOTE — Telephone Encounter (Signed)
Is this something that you would prescribe? Received medication from Doctor in [place]. Uses for psoriasis. This helps. She is having a bad breakout on her hands and arms.

## 2018-08-10 NOTE — Telephone Encounter (Signed)
I can send in a prescription.  However, if it requires a PA, I may not be able to get it approved.

## 2018-08-10 NOTE — Telephone Encounter (Signed)
Pt needs refill sent to [facility], did advise her that because dr. B did not give her this medication in the first place she might need to be seen first.  Halobetasol Prop-Tazarotene (DUOBRII) 0.01-0.045 % LOTN [[id number]]

## 2018-08-20 ENCOUNTER — Ambulatory Visit (INDEPENDENT_AMBULATORY_CARE_PROVIDER_SITE_OTHER): Payer: Medicare Other | Admitting: Internal Medicine

## 2018-08-20 ENCOUNTER — Other Ambulatory Visit: Payer: Self-pay

## 2018-08-20 ENCOUNTER — Encounter: Payer: Self-pay | Admitting: Internal Medicine

## 2018-08-20 VITALS — BP 112/68 | HR 96 | Ht 60.0 in | Wt 145.0 lb

## 2018-08-20 DIAGNOSIS — N183 Chronic kidney disease, stage 3 unspecified: Secondary | ICD-10-CM

## 2018-08-20 DIAGNOSIS — E1140 Type 2 diabetes mellitus with diabetic neuropathy, unspecified: Secondary | ICD-10-CM

## 2018-08-20 DIAGNOSIS — Z23 Encounter for immunization: Secondary | ICD-10-CM | POA: Diagnosis not present

## 2018-08-20 DIAGNOSIS — E1169 Type 2 diabetes mellitus with other specified complication: Secondary | ICD-10-CM | POA: Diagnosis not present

## 2018-08-20 DIAGNOSIS — R0789 Other chest pain: Secondary | ICD-10-CM

## 2018-08-20 DIAGNOSIS — E785 Hyperlipidemia, unspecified: Secondary | ICD-10-CM | POA: Diagnosis not present

## 2018-08-20 DIAGNOSIS — I10 Essential (primary) hypertension: Secondary | ICD-10-CM | POA: Diagnosis not present

## 2018-08-20 DIAGNOSIS — Z13820 Encounter for screening for osteoporosis: Secondary | ICD-10-CM | POA: Diagnosis not present

## 2018-08-20 DIAGNOSIS — E118 Type 2 diabetes mellitus with unspecified complications: Secondary | ICD-10-CM

## 2018-08-20 DIAGNOSIS — Z1231 Encounter for screening mammogram for malignant neoplasm of breast: Secondary | ICD-10-CM

## 2018-08-20 DIAGNOSIS — Z0000 Encounter for general adult medical examination without abnormal findings: Secondary | ICD-10-CM | POA: Diagnosis not present

## 2018-08-20 DIAGNOSIS — L409 Psoriasis, unspecified: Secondary | ICD-10-CM

## 2018-08-20 DIAGNOSIS — S6992XS Unspecified injury of left wrist, hand and finger(s), sequela: Secondary | ICD-10-CM

## 2018-08-20 LAB — POCT URINALYSIS DIPSTICK
Bilirubin, UA: NEGATIVE
Blood, UA: NEGATIVE
Glucose, UA: NEGATIVE
Ketones, UA: NEGATIVE
Leukocytes, UA: NEGATIVE
Nitrite, UA: NEGATIVE
Protein, UA: POSITIVE — AB
Spec Grav, UA: 1.025 (ref 1.010–1.025)
Urobilinogen, UA: 0.2 E.U./dL
pH, UA: 6 (ref 5.0–8.0)

## 2018-08-20 NOTE — Patient Instructions (Signed)

## 2018-08-20 NOTE — Progress Notes (Signed)
Date:  08/20/2018   Name:  Elizabeth Klein   DOB:  07-05-1950   MRN:  [id number]   Chief Complaint: Annual Exam (Breast Exam.) Elizabeth Klein is a 68 y.o. female who presents today for her Complete Annual Exam. She feels fairly well. She reports exercising occasionally. She reports she is sleeping well with Trazodone.  She denies breast problems.  Mammogram 04/2017 Colonoscopy 2017 PPV-13 02/2017  Due for PPV-23 and pt agrees No DEXA  Finger pain - left ring finger injured years ago.  Lately becoming more stiff and unable to wear her wedding ring.  Not interested in see Ortho at this time. Chest pain - she has been evaluated for chest pain several times in the recent past.  Earlier this week had brief sticking pain in her anterior chest that last a few minutes and occurred will sitting still.  There was no SOB, diaphoresis, n/v. She has a dx of CAD treated medically.  She has not been seen by cardiology since moving to [facility]. Psoriasis - followed by Dr. Phillip Heal.  She is on a topical agent that is supplied by a specialty pharmacy.  She needs refills and is reminded to call Dr. Phillip Heal to get this done.  Diabetes She presents for her follow-up (sees Dr. Honor Junes Endo) diabetic visit. She has type 2 diabetes mellitus. Pertinent negatives for hypoglycemia include no dizziness, headaches, nervousness/anxiousness or tremors. Associated symptoms include chest pain (intermittent, fleeting) and foot paresthesias. Pertinent negatives for diabetes include no fatigue, no foot ulcerations, no polydipsia, no polyuria, no visual change and no weight loss. Current diabetic treatment includes intensive insulin program and oral agent (dual therapy) (and Trulicity). She is compliant with treatment all of the time. An ACE inhibitor/angiotensin II receptor blocker is being taken.  Hypertension This is a chronic problem. The problem is controlled. Associated symptoms include chest pain (intermittent, fleeting).  Pertinent negatives include no headaches, palpitations or shortness of breath. Past treatments include beta blockers and angiotensin blockers. The current treatment provides significant improvement.  Hyperlipidemia This is a chronic problem. The problem is controlled. Associated symptoms include chest pain (intermittent, fleeting). Pertinent negatives include no shortness of breath. Current antihyperlipidemic treatment includes statins. The current treatment provides significant improvement of lipids.  Insomnia Primary symptoms: no sleep disturbance, difficulty falling asleep, frequent awakening.  The problem occurs nightly. Past treatments include medication. The treatment provided significant relief.   Lab Results  Component Value Date   HGBA1C 8.6 06/01/2018   Lab Results  Component Value Date   CREATININE 0.91 01/27/2018   BUN 20 01/27/2018   NA 141 01/27/2018   K 5.6 (H) 01/27/2018   CL 109 (H) 01/27/2018   CO2 17 (L) 01/27/2018   Lab Results  Component Value Date   CHOL 135 07/01/2017   HDL 43 07/01/2017   LDLCALC 60 07/01/2017   TRIG 159 (H) 07/01/2017   CHOLHDL 3.1 07/01/2017     Review of Systems  Constitutional: Negative for chills, fatigue, fever and weight loss.  HENT: Negative for congestion, hearing loss, tinnitus, trouble swallowing and voice change.   Eyes: Negative for visual disturbance.  Respiratory: Negative for cough, chest tightness, shortness of breath and wheezing.   Cardiovascular: Positive for chest pain (intermittent, fleeting). Negative for palpitations and leg swelling.  Gastrointestinal: Negative for abdominal pain, constipation, diarrhea and vomiting.  Endocrine: Negative for polydipsia and polyuria.  Genitourinary: Negative for dysuria, frequency, genital sores, vaginal bleeding and vaginal discharge.  Musculoskeletal: Positive for arthralgias (finger).  Negative for gait problem and joint swelling.  Skin: Positive for rash (due to psoriasis).  Negative for color change.  Allergic/Immunologic: Negative for environmental allergies.  Neurological: Positive for numbness (mild foot numbness). Negative for dizziness, tremors, light-headedness and headaches.  Hematological: Negative for adenopathy. Does not bruise/bleed easily.  Psychiatric/Behavioral: Negative for dysphoric mood and sleep disturbance. The patient has insomnia. The patient is not nervous/anxious.     Patient Active Problem List   Diagnosis Date Noted  . 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 Microalbuminuria 04/30/2017  . CKD (chronic kidney disease) stage 3, GFR 30-59 ml/min (HCC) 04/30/2017  . Type II diabetes mellitus with complication ([place]) 32/67/1245  . Essential hypertension 03/19/2017  . CAD (coronary artery disease) 03/19/2017  . History of kidney stones 03/19/2017  . Psoriasis 03/19/2017  . Hyperlipidemia associated with type 2 diabetes mellitus ([place]) 03/19/2017  . Nocturnal leg cramps 03/19/2017  . Gait instability 03/19/2017  . Insomnia 03/19/2017  . Allergic rhinitis 03/19/2017    No Known Allergies  Past Surgical History:  Procedure Laterality Date  . BREAST BIOPSY Right    neg  . CESAREAN SECTION      Social History   Tobacco Use  . Smoking status: Never Smoker  . Smokeless tobacco: Never Used  Substance Use Topics  . Alcohol use: No    Frequency: Never  . Drug use: No     Medication list has been reviewed and updated.  Current Meds  Medication Sig  . acetaminophen (TYLENOL) 500 MG tablet Take 1,000 mg by mouth every 8 (eight) hours as needed.  Marland Kitchen aspirin EC 81 MG tablet Take 1 tablet (81 mg total) by mouth daily.  Marland Kitchen atorvastatin (LIPITOR) 80 MG tablet Take 1 tablet (80 mg total) by mouth daily.  . Dulaglutide (TRULICITY) 8.09 XI/3.3AS SOPN Inject 0.75 mg into the skin once a week. (Patient taking differently: Inject 1.5 mg into the skin once a week. )  . glimepiride (AMARYL) 4 MG tablet Take 1 tablet (4 mg  total) by mouth daily with breakfast.  . glucose blood test strip Use to check BS with Accucheck Meter up to 3 times daily for DM2 E11.9  . Halobetasol Prop-Tazarotene (DUOBRII) 0.01-0.045 % LOTN Apply 1 application topically 2 (two) times daily.  Marland Kitchen LANTUS 100 UNIT/ML injection INJECT 40 UNITS  SUBCUTANEOUSLY EVERY NIGHT  AT BEDTIME (Patient taking differently: Inject 40 Units into the skin every morning. )  . losartan (COZAAR) 50 MG tablet TAKE 1 TABLET BY MOUTH  DAILY  . metFORMIN (GLUCOPHAGE) 1000 MG tablet TAKE 1 TABLET BY MOUTH  TWICE A DAY WITH MEALS  . metoprolol tartrate (LOPRESSOR) 50 MG tablet TAKE 1 TABLET BY MOUTH TWO  TIMES DAILY  . montelukast (SINGULAIR) 10 MG tablet TAKE 1 TABLET BY MOUTH  DAILY  . omeprazole (PRILOSEC) 20 MG capsule TAKE 1 CAPSULE BY MOUTH  DAILY  . ONETOUCH DELICA PLUS LANCETS MISC 1 each by Does not apply route 2 (two) times daily.  . Syringe, Disposable, (B-D 30CC SYRINGE) 30 ML MISC Use with Lantus Injections for DM2 ICD 10 E11.9  . traZODone (DESYREL) 50 MG tablet TAKE 1 TABLET BY MOUTH AT  BEDTIME    PHQ 2/9 Scores 08/20/2018 09/09/2017 06/20/2017 03/19/2017  PHQ - 2 Score 0 1 1 2   PHQ- 9 Score - 7 - 9    BP Readings from Last 3 Encounters:  08/20/18 112/68  02/24/18 118/80  01/27/18 114/70    Physical  Exam Vitals signs and nursing note reviewed.  Constitutional:      General: She is not in acute distress.    Appearance: She is well-developed.  HENT:     Head: Normocephalic and atraumatic.     Right Ear: Tympanic membrane and ear canal normal.     Left Ear: Tympanic membrane and ear canal normal.     Nose:     Right Sinus: No maxillary sinus tenderness.     Left Sinus: No maxillary sinus tenderness.     Mouth/Throat:     Pharynx: Uvula midline.  Eyes:     General: No scleral icterus.       Right eye: No discharge.        Left eye: No discharge.     Conjunctiva/sclera: Conjunctivae normal.  Neck:     Musculoskeletal: Normal range of  motion. No erythema.     Thyroid: No thyromegaly.     Vascular: No carotid bruit.  Cardiovascular:     Rate and Rhythm: Normal rate and regular rhythm.     Pulses: Normal pulses.     Heart sounds: Normal heart sounds. No murmur.  Pulmonary:     Effort: Pulmonary effort is normal. No respiratory distress.     Breath sounds: No wheezing.  Chest:     Breasts:        Right: No mass, nipple discharge, skin change or tenderness.        Left: No mass, nipple discharge, skin change or tenderness.  Abdominal:     General: Bowel sounds are normal.     Palpations: Abdomen is soft.     Tenderness: There is no abdominal tenderness.  Musculoskeletal:     Right lower leg: No edema.     Left lower leg: No edema.     Comments: decreased flexion of left ring finger  Lymphadenopathy:     Cervical: No cervical adenopathy.  Skin:    General: Skin is warm and dry.     Capillary Refill: Capillary refill takes less than 2 seconds.     Findings: Lesion present.     Comments: Psoriatic type rash on hands and elbows and in the hairline anteriorly  Neurological:     Mental Status: She is alert and oriented to person, place, and time.     Cranial Nerves: Cranial nerves are intact. No cranial nerve deficit.     Sensory: Sensation is intact. No sensory deficit.     Motor: Motor function is intact.     Deep Tendon Reflexes: Reflexes are normal and symmetric.  Psychiatric:        Attention and Perception: Attention normal.        Mood and Affect: Mood normal.        Speech: Speech normal.        Behavior: Behavior normal.        Thought Content: Thought content normal.     Wt Readings from Last 3 Encounters:  08/20/18 145 lb (65.8 kg)  02/24/18 147 lb (66.7 kg)  01/27/18 146 lb (66.2 kg)    BP 112/68   Pulse 96   Ht 5' (1.524 m)   Wt 145 lb (65.8 kg)   SpO2 98%   BMI 28.32 kg/m   Assessment and Plan: 1. Annual physical exam Normal exam Continue regular exercise - POCT urinalysis dipstick   2. Encounter for screening mammogram for breast cancer - MM 3D SCREEN BREAST BILATERAL; Future  3. Type II diabetes mellitus with  complication (HCC) Followed by Endo - Comprehensive metabolic panel - TSH  4. Essential hypertension controlled - CBC with Differential/Platelet  5. Hyperlipidemia associated with type 2 diabetes mellitus ([place]) On statin therapy - Lipid panel  6. CKD (chronic kidney disease) stage 3, GFR 30-59 ml/min (HCC) Check labs - Comprehensive metabolic panel  7. Neuropathy due to type 2 diabetes mellitus (HCC) stable  8. Encounter for screening for osteoporosis - DG Bone Density; Future  9. Need for vaccination for pneumococcus - Pneumococcal polysaccharide vaccine 23-valent greater than or equal to 2yo subcutaneous/IM  10. Atypical chest pain Continue Aspirin daily and statin Follow up if worsening or go to ED - EKG 12-Lead - NSR @ 82, WNL  11. Finger injury, left, sequela May need Ortho evaluation - pt declines for now  20. Psoriasis Continue topical agents, Duobrii- get refills from Dermatology   Partially dictated using Editor, commissioning. Any errors are unintentional.  Halina Maidens, MD [facility]  08/20/2018

## 2018-08-21 LAB — CBC WITH DIFFERENTIAL/PLATELET
Basophils Absolute: 0.1 10*3/uL (ref 0.0–0.2)
Basos: 1 %
EOS (ABSOLUTE): 0.3 10*3/uL (ref 0.0–0.4)
Eos: 3 %
Hematocrit: 37.7 % (ref 34.0–46.6)
Hemoglobin: 12.1 g/dL (ref 11.1–15.9)
Immature Grans (Abs): 0 10*3/uL (ref 0.0–0.1)
Immature Granulocytes: 0 %
Lymphocytes Absolute: 3.2 10*3/uL — ABNORMAL HIGH (ref 0.7–3.1)
Lymphs: 34 %
MCH: 28.1 pg (ref 26.6–33.0)
MCHC: 32.1 g/dL (ref 31.5–35.7)
MCV: 88 fL (ref 79–97)
Monocytes Absolute: 0.7 10*3/uL (ref 0.1–0.9)
Monocytes: 7 %
Neutrophils Absolute: 5.3 10*3/uL (ref 1.4–7.0)
Neutrophils: 55 %
Platelets: 312 10*3/uL (ref 150–450)
RBC: 4.3 x10E6/uL (ref 3.77–5.28)
RDW: 13.2 % (ref 11.7–15.4)
WBC: 9.5 10*3/uL (ref 3.4–10.8)

## 2018-08-21 LAB — COMPREHENSIVE METABOLIC PANEL
ALT: 46 IU/L — ABNORMAL HIGH (ref 0–32)
AST: 37 IU/L (ref 0–40)
Albumin/Globulin Ratio: 1.5 (ref 1.2–2.2)
Albumin: 4.4 g/dL (ref 3.8–4.8)
Alkaline Phosphatase: 129 IU/L — ABNORMAL HIGH (ref 39–117)
BUN/Creatinine Ratio: 26 (ref 12–28)
BUN: 21 mg/dL (ref 8–27)
Bilirubin Total: 0.3 mg/dL (ref 0.0–1.2)
CO2: 19 mmol/L — ABNORMAL LOW (ref 20–29)
Calcium: 9.6 mg/dL (ref 8.7–10.3)
Chloride: 104 mmol/L (ref 96–106)
Creatinine, Ser: 0.82 mg/dL (ref 0.57–1.00)
GFR calc Af Amer: 86 mL/min/{1.73_m2} (ref >59)
GFR calc non Af Amer: 75 mL/min/{1.73_m2} (ref >59)
Globulin, Total: 2.9 g/dL (ref 1.5–4.5)
Glucose: 127 mg/dL — ABNORMAL HIGH (ref 65–99)
Potassium: 5.2 mmol/L (ref 3.5–5.2)
Sodium: 139 mmol/L (ref 134–144)
Total Protein: 7.3 g/dL (ref 6.0–8.5)

## 2018-08-21 LAB — LIPID PANEL
Chol/HDL Ratio: 2.8 ratio (ref 0.0–4.4)
Cholesterol, Total: 115 mg/dL (ref 100–199)
HDL: 41 mg/dL (ref >39)
LDL Calculated: 38 mg/dL (ref 0–99)
Triglycerides: 182 mg/dL — ABNORMAL HIGH (ref 0–149)
VLDL Cholesterol Cal: 36 mg/dL (ref 5–40)

## 2018-08-21 LAB — TSH: TSH: 1.28 u[IU]/mL (ref 0.450–4.500)

## 2018-09-18 ENCOUNTER — Other Ambulatory Visit: Payer: Self-pay | Admitting: Internal Medicine

## 2018-09-18 DIAGNOSIS — I10 Essential (primary) hypertension: Secondary | ICD-10-CM

## 2018-09-18 DIAGNOSIS — I2510 Atherosclerotic heart disease of native coronary artery without angina pectoris: Secondary | ICD-10-CM

## 2018-10-07 ENCOUNTER — Other Ambulatory Visit: Payer: Self-pay

## 2018-10-07 ENCOUNTER — Ambulatory Visit (INDEPENDENT_AMBULATORY_CARE_PROVIDER_SITE_OTHER): Payer: Medicare Other

## 2018-10-07 VITALS — BP 108/72 | HR 82 | Temp 98.8°F | Ht 60.0 in | Wt 150.4 lb

## 2018-10-07 DIAGNOSIS — Z0000 Encounter for general adult medical examination without abnormal findings: Secondary | ICD-10-CM | POA: Diagnosis not present

## 2018-10-07 NOTE — Progress Notes (Signed)
Subjective:   Elizabeth Klein is a 68 y.o. female who presents for an Initial Medicare Annual Wellness Visit.  Review of Systems      Cardiac Risk Factors include: advanced age (>94men, >61 women);diabetes mellitus;dyslipidemia;hypertension     Objective:    Today's Vitals   10/07/18 1000 10/07/18 1001  BP: 108/72   Pulse: 82   Temp: 98.8 F (37.1 C)   TempSrc: Oral   Weight: 150 lb 6.4 oz (68.2 kg)   Height: 5' (1.524 m)   PainSc:  4    Body mass index is 29.37 kg/m.  Advanced Directives 10/07/2018 10/10/2017 09/06/2017 06/20/2017 01/09/2017  Does Patient Have a Medical Advance Directive? No No No No No  Would patient like information on creating a medical advance directive? Yes (MAU/Ambulatory/Procedural Areas - Information given) No - Patient declined - Yes (MAU/Ambulatory/Procedural Areas - Information given) No - Patient declined    Current Medications (verified) Outpatient Encounter Medications as of 10/07/2018  Medication Sig  . acetaminophen (TYLENOL) 500 MG tablet Take 1,000 mg by mouth every 8 (eight) hours as needed.  Marland Kitchen aspirin EC 81 MG tablet Take 1 tablet (81 mg total) by mouth daily.  Marland Kitchen atorvastatin (LIPITOR) 80 MG tablet Take 1 tablet (80 mg total) by mouth daily.  . Dulaglutide (TRULICITY) 9.37 DS/2.8JG SOPN Inject 0.75 mg into the skin once a week. (Patient taking differently: Inject 1.5 mg into the skin once a week. )  . glimepiride (AMARYL) 4 MG tablet Take 1 tablet (4 mg total) by mouth daily with breakfast.  . glucose blood test strip Use to check BS with Accucheck Meter up to 3 times daily for DM2 E11.9  . Halobetasol Prop-Tazarotene (DUOBRII) 0.01-0.045 % LOTN Apply 1 application topically 2 (two) times daily.  Marland Kitchen LANTUS 100 UNIT/ML injection INJECT 40 UNITS  SUBCUTANEOUSLY EVERY NIGHT  AT BEDTIME (Patient taking differently: Inject 40 Units into the skin every morning. Pt taking QAM)  . losartan (COZAAR) 50 MG tablet TAKE 1 TABLET BY MOUTH  DAILY  .  metFORMIN (GLUCOPHAGE) 1000 MG tablet TAKE 1 TABLET BY MOUTH  TWICE A DAY WITH MEALS  . metoprolol tartrate (LOPRESSOR) 50 MG tablet TAKE 1 TABLET BY MOUTH  TWICE DAILY  . montelukast (SINGULAIR) 10 MG tablet TAKE 1 TABLET BY MOUTH  DAILY  . omeprazole (PRILOSEC) 20 MG capsule TAKE 1 CAPSULE BY MOUTH  DAILY  . ONETOUCH DELICA PLUS LANCETS MISC 1 each by Does not apply route 2 (two) times daily.  . Syringe, Disposable, (B-D 30CC SYRINGE) 30 ML MISC Use with Lantus Injections for DM2 ICD 10 E11.9  . traZODone (DESYREL) 50 MG tablet TAKE 1 TABLET BY MOUTH AT  BEDTIME   No facility-administered encounter medications on file as of 10/07/2018.     Allergies (verified) Otezla [apremilast] and Prednisone   History: Past Medical History:  Diagnosis Date  . Acid reflux   . Chest pain   . Diabetes mellitus without complication ([place])   . High cholesterol   . Hypertension   . Kidney stones   . Migraines   . Psoriasis   . Rapid heart rate   . Weight loss    Past Surgical History:  Procedure Laterality Date  . BREAST BIOPSY Right    neg  . CESAREAN SECTION     Family History  Problem Relation Age of Onset  . Breast cancer Neg Hx   . Diabetes Brother   . Heart attack Brother   . Diabetes Brother   .  Heart attack Brother   . Diabetes Brother    Social History   Socioeconomic History  . Marital status: Married    Spouse name: Not on file  . Number of children: 1  . Years of education: Not on file  . Highest education level: Not on file  Occupational History  . Not on file  Social Needs  . Financial resource strain: Not hard at all  . Food insecurity    Worry: Never true    Inability: Never true  . Transportation needs    Medical: No    Non-medical: No  Tobacco Use  . Smoking status: Never Smoker  . Smokeless tobacco: Never Used  Substance and Sexual Activity  . Alcohol use: No    Frequency: Never  . Drug use: No  . Sexual activity: Not on file  Lifestyle  . Physical  activity    Days per week: 0 days    Minutes per session: 0 min  . Stress: To some extent  Relationships  . Social connections    Talks on phone: More than three times a week    Gets together: Twice a week    Attends religious service: 1 to 4 times per year    Active member of club or organization: Yes    Attends meetings of clubs or organizations: Never    Relationship status: Married  Other Topics Concern  . Not on file  Social History Narrative           Tobacco Counseling Counseling given: Not Answered   Clinical Intake:  Pre-visit preparation completed: Yes  Pain : 0-10 Pain Score: 4  Pain Type: Acute pain Pain Location: Head(headache) Pain Descriptors / Indicators: Aching Pain Onset: Today Pain Frequency: Intermittent     BMI - recorded: 29.37 Nutritional Status: BMI 25 -29 Overweight Nutritional Risks: Nausea/ vomitting/ diarrhea(nausea, possibly related to sugar drop) Diabetes: Yes CBG done?: No Did pt. bring in CBG monitor from home?: No   Nutrition Risk Assessment:  Has the patient had any N/V/D within the last 2 months?  Yes  Does the patient have any non-healing wounds?  No  Has the patient had any unintentional weight loss or weight gain?  No   Diabetes:  Is the patient diabetic?  Yes  If diabetic, was a CBG obtained today?  No  Did the patient bring in their glucometer from home?  No  How often do you monitor your CBG's? Twice daily.   Financial Strains and Diabetes Management:  Are you having any financial strains with the device, your supplies or your medication? No .  Does the patient want to be seen by Chronic Care Management for management of their diabetes?  No  Would the patient like to be referred to a Nutritionist or for Diabetic Management?  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   Diabetic Foot Exam: Completed 08/20/18.   How often do you need to have someone help you when you read  instructions, pamphlets, or other written materials from your doctor or pharmacy?: 1 - Never  Interpreter Needed?: No  Information entered by :: [name] LPN   Activities of Daily Living In your present state of health, do you have any difficulty performing the following activities: 10/07/2018  Hearing? N  Comment declines hearing aids  Vision? N  Comment wears glasses  Difficulty concentrating or making decisions? Y  Walking or climbing stairs? N  Dressing or bathing? N  Doing errands, shopping? N  Preparing Food and eating ? N  Using the Toilet? N  In the past six months, have you accidently leaked urine? Y  Comment urge incontinency, wears liners for protection  Do you have problems with loss of bowel control? N  Managing your Medications? N  Managing your Finances? N  Housekeeping or managing your Housekeeping? N  Some recent data might be hidden     Immunizations and Health Maintenance Immunization History  Administered Date(s) Administered  . Influenza, High Dose Seasonal PF 01/07/2018  . Influenza-Unspecified 10/26/2016  . Pneumococcal Conjugate-13 03/19/2017  . Pneumococcal Polysaccharide-23 08/20/2018  . Zoster Recombinat (Shingrix) 04/30/2017   Health Maintenance Due  Topic Date Due  . DEXA SCAN  03/13/2017  . INFLUENZA VACCINE  09/26/2018    Patient Care Team: Glean Hess, MD as PCP - General (Internal Medicine) Lonia Farber, MD as Consulting Physician (Endocrinology) Laneta Simmers as Physician Assistant (Urology) Eulogio Bear, MD as Consulting Physician (Ophthalmology) Jannet Mantis, MD (Dermatology)  Indicate any recent Medical Services you may have received from other than [facility] providers in the past year (date may be approximate).     Assessment:   This is a routine wellness examination for Elizabeth Klein.  Hearing/Vision screen  Hearing Screening   125Hz  250Hz  500Hz  1000Hz  2000Hz  3000Hz  4000Hz  6000Hz  8000Hz    Right ear:           Left ear:           Comments: Pt denies hearing difficulty   Vision Screening Comments: [facility] screen[facility]  Dietary issues and exercise activities discussed: Current Exercise Habits: The patient does not participate in regular exercise at present, Exercise limited by: None identified  Goals    . Increase physical activity     Recommend increasing physical activity to 150 minutes per week      Depression Screen PHQ 2/9 Scores 10/07/2018 08/20/2018 09/09/2017 06/20/2017 03/19/2017  PHQ - 2 Score 2 0 1 1 2   PHQ- 9 Score 3 - 7 - 9    Fall Risk Fall Risk  10/07/2018 08/20/2018 06/20/2017 03/19/2017  Falls in the past year? 0 0 Yes No  Number falls in past yr: 0 0 1 -  Injury with Fall? 0 0 No -  Comment - - briefly disoriented -  Risk for fall due to : - History of fall(s) History of fall(s) -  Follow up Falls prevention discussed Falls evaluation completed Education provided -   FALL RISK PREVENTION PERTAINING TO THE HOME:  Any stairs in or around the home? Yes  If so, do they handrails? Yes   Home free of loose throw rugs in walkways, pet beds, electrical cords, etc? Yes  Adequate lighting in your home to reduce risk of falls? Yes   ASSISTIVE DEVICES UTILIZED TO PREVENT FALLS:  Life alert? No  Use of a cane, walker or w/c? No  Grab bars in the bathroom? Yes  Shower chair or bench in shower? Yes  Elevated toilet seat or a handicapped toilet? Yes   DME ORDERS:  DME order needed?  No   TIMED UP AND GO:  Was the test performed? Yes .  Length of time to ambulate 10 feet: 5 sec.   GAIT:  Appearance of gait: Gait stead-fast and without the use of an assistive device.    Education: Fall risk prevention has been discussed.  Intervention(s) required? No   Cognitive Function:  6CIT Screen 10/07/2018  What Year? 0 points  What month? 0 points  What time? 0 points  Count back from 20 0 points  Months in reverse 0 points   Repeat phrase 0 points  Total Score 0    Screening Tests Health Maintenance  Topic Date Due  . DEXA SCAN  03/13/2017  . INFLUENZA VACCINE  09/26/2018  . TETANUS/TDAP  08/20/2019 (Originally 03/14/1969)  . OPHTHALMOLOGY EXAM  11/22/2018  . HEMOGLOBIN A1C  12/01/2018  . MAMMOGRAM  05/06/2019  . FOOT EXAM  08/20/2019  . COLONOSCOPY  09/25/2025  . Hepatitis C Screening  Completed  . PNA vac Low Risk Adult  Completed    Qualifies for Shingles Vaccine? Yes . Due for second dose of Shingrix.  Tdap: Although this vaccine is not a covered service during a Wellness Exam, does the patient still wish to receive this vaccine today?  No .  Education has been provided regarding the importance of this vaccine. Advised may receive this vaccine at local pharmacy or Health Dept. Aware to provide a copy of the vaccination record if obtained from local pharmacy or Health Dept. Verbalized acceptance and understanding.  Flu Vaccine: Up to date . Pneumococcal Vaccine: Up to date  Cancer Screenings:  Colorectal Screening: Completed 09/26/15. Repeat every 10 years;   Mammogram: Completed 05/05/17. Scheduled for 10/14/18.  Bone Density: Scheduled for 10/14/18.  Lung Cancer Screening: (Low Dose CT Chest recommended if Age 95-80 years, 30 pack-year currently smoking OR have quit w/in 15years.) does not qualify.     Additional Screening:  Hepatitis C Screening: does qualify; Completed 06/02/17.  Vision Screening: Recommended annual ophthalmology exams for early detection of glaucoma and other disorders of the eye. Is the patient up to date with their annual eye exam?  Yes  Who is the provider or what is the name of the office in which the pt attends annual eye exams? [place] Screening: Recommended annual dental exams for proper oral hygiene  Community Resource Referral:  CRR required this visit?  No      Plan:    I have personally reviewed and addressed the Medicare Annual  Wellness questionnaire and have noted the following in the patient's chart:  A. Medical and social history B. Use of alcohol, tobacco or illicit drugs  C. Current medications and supplements D. Functional ability and status E.  Nutritional status F.  Physical activity G. Advance directives H. List of other physicians I.  Hospitalizations, surgeries, and ER visits in previous 12 months J.  [place] such as hearing and vision if needed, cognitive and depression L. Referrals and appointments   In addition, I have reviewed and discussed with patient certain preventive protocols, quality metrics, and best practice recommendations. A written personalized care plan for preventive services as well as general preventive health recommendations were provided to patient.   Signed,   [name], LPN Nurse Health Advisor   Nurse Notes: none

## 2018-10-07 NOTE — Patient Instructions (Signed)
Elizabeth Klein , Thank you for taking time to come for your Medicare Wellness Visit. I appreciate your ongoing commitment to your health goals. Please review the following plan we discussed and let me know if I can assist you in the future.   Screening recommendations/referrals: Colonoscopy: done 09/26/15. Repeat in 2027 Mammogram: done 05/05/17. Scheduled for 10/14/18. Bone Density: scheduled for 10/14/18 Recommended yearly ophthalmology/optometry visit for glaucoma screening and checkup Recommended yearly dental visit for hygiene and checkup  Vaccinations: Influenza vaccine: done 01/07/18 Pneumococcal vaccine: done 6/25/0 Tdap vaccine: due - please contact us if you get a cut or scrape Shingles vaccine: due for second dose of Shingrix    Advanced directives: Advance directive discussed with you today. I have provided a copy for you to complete at home and have notarized. Once this is complete please bring a copy in to our office so we can scan it into your chart.  Conditions/risks identified: recommend increasing physical activity   Next appointment: Please follow up in one year for your Medicare Annual Wellness visit.     Preventive Care 34 Years and Older, Female Preventive care refers to lifestyle choices and visits with your health care provider that can promote health and wellness. What does preventive care include?  A yearly physical exam. This is also called an annual well check.  Dental exams once or twice a year.  Routine eye exams. Ask your health care provider how often you should have your eyes checked.  Personal lifestyle choices, including:  Daily care of your teeth and gums.  Regular physical activity.  Eating a healthy diet.  Avoiding tobacco and drug use.  Limiting alcohol use.  Practicing safe sex.  Taking low-dose aspirin every day.  Taking vitamin and mineral supplements as recommended by your health care provider. What happens during an annual  well check? The services and screenings done by your health care provider during your annual well check will depend on your age, overall health, lifestyle risk factors, and family history of disease. Counseling  Your health care provider may ask you questions about your:  Alcohol use.  Tobacco use.  Drug use.  Emotional well-being.  Home and relationship well-being.  Sexual activity.  Eating habits.  History of falls.  Memory and ability to understand (cognition).  Work and work Statistician.  Reproductive health. Screening  You may have the following tests or measurements:  Height, weight, and BMI.  Blood pressure.  Lipid and cholesterol levels. These may be checked every 5 years, or more frequently if you are over 81 years old.  Skin check.  Lung cancer screening. You may have this screening every year starting at age 41 if you have a 30-pack-year history of smoking and currently smoke or have quit within the past 15 years.  Fecal occult blood test (FOBT) of the stool. You may have this test every year starting at age 86.  Flexible sigmoidoscopy or colonoscopy. You may have a sigmoidoscopy every 5 years or a colonoscopy every 10 years starting at age 87.  Hepatitis C blood test.  Hepatitis B blood test.  Sexually transmitted disease (STD) testing.  Diabetes screening. This is done by checking your blood sugar (glucose) after you have not eaten for a while (fasting). You may have this done every 1-3 years.  Bone density scan. This is done to screen for osteoporosis. You may have this done starting at age 67.  Mammogram. This may be done every 1-2 years. Talk to your health care  provider about how often you should have regular mammograms. Talk with your health care provider about your test results, treatment options, and if necessary, the need for more tests. Vaccines  Your health care provider may recommend certain vaccines, such as:  Influenza vaccine. This  is recommended every year.  Tetanus, diphtheria, and acellular pertussis (Tdap, Td) vaccine. You may need a Td booster every 10 years.  Zoster vaccine. You may need this after age 35.  Pneumococcal 13-valent conjugate (PCV13) vaccine. One dose is recommended after age 31.  Pneumococcal polysaccharide (PPSV23) vaccine. One dose is recommended after age 60. Talk to your health care provider about which screenings and vaccines you need and how often you need them. This information is not intended to replace advice given to you by your health care provider. Make sure you discuss any questions you have with your health care provider. Document Released: 03/10/2015 Document Revised: 11/01/2015 Document Reviewed: 12/13/2014 Elsevier Interactive Patient Education  2017 [employer] Prevention in the Home Falls can cause injuries. They can happen to people of all ages. There are many things you can do to make your home safe and to help prevent falls. What can I do on the outside of my home?  Regularly fix the edges of walkways and driveways and fix any cracks.  Remove anything that might make you trip as you walk through a door, such as a raised step or threshold.  Trim any bushes or trees on the path to your home.  Use bright outdoor lighting.  Clear any walking paths of anything that might make someone trip, such as rocks or tools.  Regularly check to see if handrails are loose or broken. Make sure that both sides of any steps have handrails.  Any raised decks and porches should have guardrails on the edges.  Have any leaves, snow, or ice cleared regularly.  Use sand or salt on walking paths during winter.  Clean up any spills in your garage right away. This includes oil or grease spills. What can I do in the bathroom?  Use night lights.  Install grab bars by the toilet and in the tub and shower. Do not use towel bars as grab bars.  Use non-skid mats or decals in the tub or  shower.  If you need to sit down in the shower, use a plastic, non-slip stool.  Keep the floor dry. Clean up any water that spills on the floor as soon as it happens.  Remove soap buildup in the tub or shower regularly.  Attach bath mats securely with double-sided non-slip rug tape.  Do not have throw rugs and other things on the floor that can make you trip. What can I do in the bedroom?  Use night lights.  Make sure that you have a light by your bed that is easy to reach.  Do not use any sheets or blankets that are too big for your bed. They should not hang down onto the floor.  Have a firm chair that has side arms. You can use this for support while you get dressed.  Do not have throw rugs and other things on the floor that can make you trip. What can I do in the kitchen?  Clean up any spills right away.  Avoid walking on wet floors.  Keep items that you use a lot in easy-to-reach places.  If you need to reach something above you, use a strong step stool that has a grab bar.  Keep electrical cords out of the way.  Do not use floor polish or wax that makes floors slippery. If you must use wax, use non-skid floor wax.  Do not have throw rugs and other things on the floor that can make you trip. What can I do with my stairs?  Do not leave any items on the stairs.  Make sure that there are handrails on both sides of the stairs and use them. Fix handrails that are broken or loose. Make sure that handrails are as long as the stairways.  Check any carpeting to make sure that it is firmly attached to the stairs. Fix any carpet that is loose or worn.  Avoid having throw rugs at the top or bottom of the stairs. If you do have throw rugs, attach them to the floor with carpet tape.  Make sure that you have a light switch at the top of the stairs and the bottom of the stairs. If you do not have them, ask someone to add them for you. What else can I do to help prevent falls?   Wear shoes that:  Do not have high heels.  Have rubber bottoms.  Are comfortable and fit you well.  Are closed at the toe. Do not wear sandals.  If you use a stepladder:  Make sure that it is fully opened. Do not climb a closed stepladder.  Make sure that both sides of the stepladder are locked into place.  Ask someone to hold it for you, if possible.  Clearly mark and make sure that you can see:  Any grab bars or handrails.  First and last steps.  Where the edge of each step is.  Use tools that help you move around (mobility aids) if they are needed. These include:  Canes.  Walkers.  Scooters.  Crutches.  Turn on the lights when you go into a dark area. Replace any light bulbs as soon as they burn out.  Set up your furniture so you have a clear path. Avoid moving your furniture around.  If any of your floors are uneven, fix them.  If there are any pets around you, be aware of where they are.  Review your medicines with your doctor. Some medicines can make you feel dizzy. This can increase your chance of falling. Ask your doctor what other things that you can do to help prevent falls. This information is not intended to replace advice given to you by your health care provider. Make sure you discuss any questions you have with your health care provider. Document Released: 12/08/2008 Document Revised: 07/20/2015 Document Reviewed: 03/18/2014 Elsevier Interactive Patient Education  2017 [employer].

## 2018-10-09 DIAGNOSIS — Z794 Long term (current) use of insulin: Secondary | ICD-10-CM | POA: Diagnosis not present

## 2018-10-09 DIAGNOSIS — E1169 Type 2 diabetes mellitus with other specified complication: Secondary | ICD-10-CM | POA: Diagnosis not present

## 2018-10-09 DIAGNOSIS — E1142 Type 2 diabetes mellitus with diabetic polyneuropathy: Secondary | ICD-10-CM | POA: Diagnosis not present

## 2018-10-09 DIAGNOSIS — I10 Essential (primary) hypertension: Secondary | ICD-10-CM | POA: Diagnosis not present

## 2018-10-09 DIAGNOSIS — E1159 Type 2 diabetes mellitus with other circulatory complications: Secondary | ICD-10-CM | POA: Diagnosis not present

## 2018-10-14 ENCOUNTER — Other Ambulatory Visit: Payer: Self-pay

## 2018-10-14 ENCOUNTER — Ambulatory Visit
Admission: RE | Admit: 2018-10-14 | Discharge: 2018-10-14 | Disposition: A | Discharge status: Home / Self Care | Payer: Medicare Other | Source: Ambulatory Visit | Attending: Internal Medicine | Admitting: Internal Medicine

## 2018-10-14 DIAGNOSIS — Z13820 Encounter for screening for osteoporosis: Secondary | ICD-10-CM

## 2018-10-14 DIAGNOSIS — Z1231 Encounter for screening mammogram for malignant neoplasm of breast: Secondary | ICD-10-CM

## 2018-10-14 DIAGNOSIS — M8580 Other specified disorders of bone density and structure, unspecified site: Secondary | ICD-10-CM | POA: Insufficient documentation

## 2018-10-14 DIAGNOSIS — M8589 Other specified disorders of bone density and structure, multiple sites: Secondary | ICD-10-CM | POA: Diagnosis not present

## 2018-10-14 DIAGNOSIS — E119 Type 2 diabetes mellitus without complications: Secondary | ICD-10-CM | POA: Insufficient documentation

## 2018-10-14 DIAGNOSIS — Z780 Asymptomatic menopausal state: Secondary | ICD-10-CM | POA: Insufficient documentation

## 2018-11-04 ENCOUNTER — Other Ambulatory Visit: Payer: Self-pay | Admitting: Internal Medicine

## 2018-11-04 DIAGNOSIS — K219 Gastro-esophageal reflux disease without esophagitis: Secondary | ICD-10-CM

## 2018-11-04 DIAGNOSIS — E785 Hyperlipidemia, unspecified: Secondary | ICD-10-CM

## 2018-11-08 ENCOUNTER — Other Ambulatory Visit: Payer: Self-pay | Admitting: Internal Medicine

## 2018-11-08 DIAGNOSIS — G4700 Insomnia, unspecified: Secondary | ICD-10-CM

## 2018-11-18 DIAGNOSIS — E119 Type 2 diabetes mellitus without complications: Secondary | ICD-10-CM | POA: Diagnosis not present

## 2018-11-18 DIAGNOSIS — Z0100 Encounter for examination of eyes and vision without abnormal findings: Secondary | ICD-10-CM | POA: Diagnosis not present

## 2018-12-23 ENCOUNTER — Other Ambulatory Visit: Payer: Self-pay | Admitting: Internal Medicine

## 2018-12-23 DIAGNOSIS — J309 Allergic rhinitis, unspecified: Secondary | ICD-10-CM

## 2018-12-23 DIAGNOSIS — E1140 Type 2 diabetes mellitus with diabetic neuropathy, unspecified: Secondary | ICD-10-CM

## 2019-01-05 DIAGNOSIS — E113293 Type 2 diabetes mellitus with mild nonproliferative diabetic retinopathy without macular edema, bilateral: Secondary | ICD-10-CM | POA: Diagnosis not present

## 2019-01-05 LAB — HM DIABETES EYE EXAM

## 2019-01-11 ENCOUNTER — Encounter: Payer: Self-pay | Admitting: Internal Medicine

## 2019-01-18 DIAGNOSIS — E1142 Type 2 diabetes mellitus with diabetic polyneuropathy: Secondary | ICD-10-CM | POA: Diagnosis not present

## 2019-01-18 DIAGNOSIS — Z794 Long term (current) use of insulin: Secondary | ICD-10-CM | POA: Diagnosis not present

## 2019-01-18 DIAGNOSIS — I10 Essential (primary) hypertension: Secondary | ICD-10-CM | POA: Diagnosis not present

## 2019-01-18 DIAGNOSIS — E1169 Type 2 diabetes mellitus with other specified complication: Secondary | ICD-10-CM | POA: Diagnosis not present

## 2019-01-18 DIAGNOSIS — E1159 Type 2 diabetes mellitus with other circulatory complications: Secondary | ICD-10-CM | POA: Diagnosis not present

## 2019-01-18 LAB — HEMOGLOBIN A1C: Hemoglobin A1C: 8.3

## 2019-01-25 ENCOUNTER — Other Ambulatory Visit: Payer: Self-pay | Admitting: Internal Medicine

## 2019-01-25 DIAGNOSIS — IMO0002 Reserved for concepts with insufficient information to code with codable children: Secondary | ICD-10-CM

## 2019-01-25 DIAGNOSIS — E1121 Type 2 diabetes mellitus with diabetic nephropathy: Secondary | ICD-10-CM

## 2019-02-23 ENCOUNTER — Other Ambulatory Visit: Payer: Self-pay

## 2019-02-23 ENCOUNTER — Encounter: Payer: Self-pay | Admitting: Internal Medicine

## 2019-02-23 ENCOUNTER — Ambulatory Visit (INDEPENDENT_AMBULATORY_CARE_PROVIDER_SITE_OTHER): Payer: Medicare Other | Admitting: Internal Medicine

## 2019-02-23 VITALS — BP 124/81 | HR 98 | Temp 98.0°F | Resp 16 | Ht 60.0 in | Wt 149.0 lb

## 2019-02-23 DIAGNOSIS — E118 Type 2 diabetes mellitus with unspecified complications: Secondary | ICD-10-CM | POA: Diagnosis not present

## 2019-02-23 DIAGNOSIS — E785 Hyperlipidemia, unspecified: Secondary | ICD-10-CM

## 2019-02-23 DIAGNOSIS — R1312 Dysphagia, oropharyngeal phase: Secondary | ICD-10-CM | POA: Diagnosis not present

## 2019-02-23 DIAGNOSIS — I10 Essential (primary) hypertension: Secondary | ICD-10-CM | POA: Diagnosis not present

## 2019-02-23 DIAGNOSIS — E1169 Type 2 diabetes mellitus with other specified complication: Secondary | ICD-10-CM

## 2019-02-23 NOTE — Progress Notes (Signed)
Date:  02/23/2019   Name:  Elizabeth Klein   DOB:  07/03/1950   MRN:  [id number]   Chief Complaint: Hypertension (follow up ) and Hyperlipidemia (wants to check again since Triglicerides were elevated last 2 times. )  Hypertension This is a chronic problem. The problem is controlled. Pertinent negatives include no chest pain, headaches, palpitations or shortness of breath. Past treatments include angiotensin blockers and beta blockers. The current treatment provides significant improvement. There are no compliance problems.   Hyperlipidemia This is a chronic problem. The problem is uncontrolled. Pertinent negatives include no chest pain or shortness of breath. She is currently on no antihyperlipidemic treatment.  Diabetes She presents for her follow-up diabetic visit. She has type 2 diabetes mellitus. Her disease course has been improving. Pertinent negatives for hypoglycemia include no dizziness, headaches, nervousness/anxiousness or tremors. Pertinent negatives for diabetes include no chest pain, no fatigue, no polydipsia and no polyuria. Current diabetic treatment includes oral agent (dual therapy) (metformin and glimepiride and Ozempic). She is compliant with treatment all of the time. Her home blood glucose trend is decreasing steadily.  Dysphagia - she has issues when drinking liquids - it seems to stick in her throat and she has to cough and gag to get her breath.  Today she tried blowing her nose when it started and that aborted the episode.  She occasionally sptis up fluid and food but mostly liquids.  She denies significant gerd symptoms and is on omeprazole daily.  Lab Results  Component Value Date   CREATININE 0.82 08/20/2018   BUN 21 08/20/2018   NA 139 08/20/2018   K 5.2 08/20/2018   CL 104 08/20/2018   CO2 19 (L) 08/20/2018   Lab Results  Component Value Date   CHOL 115 08/20/2018   HDL 41 08/20/2018   LDLCALC 38 08/20/2018   TRIG 182 (H) 08/20/2018   CHOLHDL 2.8  08/20/2018   Lab Results  Component Value Date   TSH 1.280 08/20/2018   Lab Results  Component Value Date   HGBA1C 8.6 06/01/2018     Review of Systems  Constitutional: Negative for appetite change, fatigue, fever and unexpected weight change.  HENT: Positive for trouble swallowing. Negative for tinnitus.   Eyes: Negative for visual disturbance.  Respiratory: Negative for cough, chest tightness and shortness of breath.   Cardiovascular: Negative for chest pain, palpitations and leg swelling.  Gastrointestinal: Positive for diarrhea (occasional loose stools ). Negative for abdominal pain and constipation.  Endocrine: Negative for polydipsia and polyuria.  Genitourinary: Negative for dysuria and hematuria.  Musculoskeletal: Negative for arthralgias.  Neurological: Negative for dizziness, tremors, numbness and headaches.  Psychiatric/Behavioral: Negative for dysphoric mood and sleep disturbance. The patient is not nervous/anxious.     Patient Active Problem List   Diagnosis Date Noted  . 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 Finger injury, left, sequela 08/20/2018  . Microalbuminuria 04/30/2017  . CKD (chronic kidney disease) stage 3, GFR 30-59 ml/min 04/30/2017  . Type II diabetes mellitus with complication ([place]) [id number]  . Essential hypertension 03/19/2017  . CAD (coronary artery disease) 03/19/2017  . History of kidney stones 03/19/2017  . Psoriasis 03/19/2017  . Hyperlipidemia associated with type 2 diabetes mellitus ([place]) 03/19/2017  . Nocturnal leg cramps 03/19/2017  . Gait instability 03/19/2017  . Insomnia 03/19/2017  . Allergic rhinitis 03/19/2017    Allergies  Allergen Reactions  . Rutherford Nail [Apremilast] Other (See Comments)    Change in mental  status  . Prednisone Other (See Comments)    Intolerance     Past Surgical History:  Procedure Laterality Date  . BREAST BIOPSY Right    neg  . CESAREAN SECTION      Social History    Tobacco Use  . Smoking status: Never Smoker  . Smokeless tobacco: Never Used  Substance Use Topics  . Alcohol use: No  . Drug use: No     Medication list has been reviewed and updated.  Current Meds  Medication Sig  . acetaminophen (TYLENOL) 500 MG tablet Take 1,000 mg by mouth every 8 (eight) hours as needed.  Marland Kitchen aspirin EC 81 MG tablet Take 1 tablet (81 mg total) by mouth daily.  Marland Kitchen atorvastatin (LIPITOR) 80 MG tablet TAKE 1 TABLET BY MOUTH  DAILY  . glimepiride (AMARYL) 4 MG tablet Take 1 tablet (4 mg total) by mouth daily with breakfast.  . glucose blood test strip Use to check BS with Accucheck Meter up to 3 times daily for DM2 E11.9  . Halobetasol Prop-Tazarotene (DUOBRII) 0.01-0.045 % LOTN Apply 1 application topically 2 (two) times daily.  . insulin glargine (LANTUS) 100 UNIT/ML injection Inject 0.4 mLs (40 Units total) into the skin every morning. Pt taking QAM  . losartan (COZAAR) 50 MG tablet TAKE 1 TABLET BY MOUTH  DAILY  . metFORMIN (GLUCOPHAGE) 1000 MG tablet TAKE 1 TABLET BY MOUTH  TWICE DAILY WITH MEALS  . metoprolol tartrate (LOPRESSOR) 50 MG tablet TAKE 1 TABLET BY MOUTH  TWICE DAILY  . montelukast (SINGULAIR) 10 MG tablet TAKE 1 TABLET BY MOUTH  DAILY  . omeprazole (PRILOSEC) 20 MG capsule TAKE 1 CAPSULE BY MOUTH  DAILY  . ONETOUCH DELICA PLUS LANCETS MISC 1 each by Does not apply route 2 (two) times daily.  . Semaglutide,0.25 or 0.5MG /DOS, (OZEMPIC, 0.25 OR 0.5 MG/DOSE,) 2 MG/1.5ML SOPN Inject 0.5 mg into the skin once a week.  . Syringe, Disposable, (B-D 30CC SYRINGE) 30 ML MISC Use with Lantus Injections for DM2 ICD 10 E11.9  . traZODone (DESYREL) 50 MG tablet TAKE 1 TABLET BY MOUTH AT  BEDTIME  . [DISCONTINUED] Dulaglutide (TRULICITY) A999333 0000000 SOPN Inject 0.75 mg into the skin once a week. (Patient taking differently: Inject 1.5 mg into the skin once a week. )    PHQ 2/9 Scores 10/07/2018 08/20/2018 09/09/2017 06/20/2017  PHQ - 2 Score 2 0 1 1  PHQ- 9 Score  3 - 7 -    BP Readings from Last 3 Encounters:  02/23/19 124/81  10/07/18 108/72  08/20/18 112/68    Physical Exam Vitals and nursing note reviewed.  Constitutional:      General: She is not in acute distress.    Appearance: Normal appearance. She is well-developed.  HENT:     Head: Normocephalic and atraumatic.  Cardiovascular:     Rate and Rhythm: Normal rate and regular rhythm.     Pulses: Normal pulses.  Pulmonary:     Effort: Pulmonary effort is normal. No respiratory distress.  Musculoskeletal:        General: Normal range of motion.     Right lower leg: No edema.     Left lower leg: No edema.  Lymphadenopathy:     Cervical: No cervical adenopathy.  Skin:    General: Skin is warm and dry.     Capillary Refill: Capillary refill takes less than 2 seconds.     Findings: No rash.  Neurological:     General: No focal  deficit present.     Mental Status: She is alert and oriented to person, place, and time.  Psychiatric:        Attention and Perception: Attention normal.        Mood and Affect: Mood normal.        Behavior: Behavior normal.        Thought Content: Thought content normal.     Wt Readings from Last 3 Encounters:  02/23/19 149 lb (67.6 kg)  10/07/18 150 lb 6.4 oz (68.2 kg)  08/20/18 145 lb (65.8 kg)    BP 124/81   Pulse 98   Temp 98 F (36.7 C) (Temporal)   Resp 16   Ht 5' (1.524 m)   Wt 149 lb (67.6 kg)   SpO2 98%   BMI 29.10 kg/m   Assessment and Plan: 1. Essential hypertension Clinically stable exam with well controlled BP.   Tolerating medications, metformin and losartan, without side effects at this time. Pt to continue current regimen and low sodium diet; benefits of regular exercise as able discussed. - Comprehensive metabolic panel  2. Type II diabetes mellitus with complication (HCC) Clinically stable by exam and report without s/s of hypoglycemia since lowering the dose of glimepiride from 8 mg to 4 mg DM complicated by HTN,  lipids. BS are improving with medication adjustment Tolerating medications glimepiride 4 mg daily, metformin 1000 mg bid and Ozempic 0.5 mg weekly, well without side effects or other concerns. - Comprehensive metabolic panel  3. Hyperlipidemia associated with type 2 diabetes mellitus ([place]) She is tolerating atorvastatin at max dose without side effects TG remain slightly elevated - encouraged her to continue low fat diet Will recheck at visit in June  4. Oropharyngeal dysphagia Uncertain cause - pt urged to drink slowly and monitor symptoms for worsening.  There are no red flag signs such a melena, coffee grounds emesis, weight loss or fever. She declines GI referral at this time. Continue Omeprazole daily.   Partially dictated using Editor, commissioning. Any errors are unintentional.  Halina Maidens, MD [facility]  02/23/2019

## 2019-02-24 LAB — COMPREHENSIVE METABOLIC PANEL
ALT: 33 IU/L — ABNORMAL HIGH (ref 0–32)
AST: 32 IU/L (ref 0–40)
Albumin/Globulin Ratio: 1.6 (ref 1.2–2.2)
Albumin: 4.5 g/dL (ref 3.8–4.8)
Alkaline Phosphatase: 95 IU/L (ref 39–117)
BUN/Creatinine Ratio: 20 (ref 12–28)
BUN: 23 mg/dL (ref 8–27)
Bilirubin Total: 0.3 mg/dL (ref 0.0–1.2)
CO2: 22 mmol/L (ref 20–29)
Calcium: 10.7 mg/dL — ABNORMAL HIGH (ref 8.7–10.3)
Chloride: 103 mmol/L (ref 96–106)
Creatinine, Ser: 1.16 mg/dL — ABNORMAL HIGH (ref 0.57–1.00)
GFR calc Af Amer: 57 mL/min/{1.73_m2} — ABNORMAL LOW (ref >59)
GFR calc non Af Amer: 49 mL/min/{1.73_m2} — ABNORMAL LOW (ref >59)
Globulin, Total: 2.9 g/dL (ref 1.5–4.5)
Glucose: 77 mg/dL (ref 65–99)
Potassium: 5.8 mmol/L — ABNORMAL HIGH (ref 3.5–5.2)
Sodium: 140 mmol/L (ref 134–144)
Total Protein: 7.4 g/dL (ref 6.0–8.5)

## 2019-03-12 ENCOUNTER — Ambulatory Visit: Payer: Medicare Other | Admitting: Internal Medicine

## 2019-03-12 DIAGNOSIS — E1142 Type 2 diabetes mellitus with diabetic polyneuropathy: Secondary | ICD-10-CM | POA: Diagnosis not present

## 2019-03-12 DIAGNOSIS — E1159 Type 2 diabetes mellitus with other circulatory complications: Secondary | ICD-10-CM | POA: Diagnosis not present

## 2019-03-12 DIAGNOSIS — Z794 Long term (current) use of insulin: Secondary | ICD-10-CM | POA: Diagnosis not present

## 2019-03-12 DIAGNOSIS — E1169 Type 2 diabetes mellitus with other specified complication: Secondary | ICD-10-CM | POA: Diagnosis not present

## 2019-03-12 DIAGNOSIS — I10 Essential (primary) hypertension: Secondary | ICD-10-CM | POA: Diagnosis not present

## 2019-04-26 DIAGNOSIS — E1142 Type 2 diabetes mellitus with diabetic polyneuropathy: Secondary | ICD-10-CM | POA: Diagnosis not present

## 2019-04-26 DIAGNOSIS — Z794 Long term (current) use of insulin: Secondary | ICD-10-CM | POA: Diagnosis not present

## 2019-04-26 DIAGNOSIS — E1169 Type 2 diabetes mellitus with other specified complication: Secondary | ICD-10-CM | POA: Diagnosis not present

## 2019-04-26 DIAGNOSIS — E1159 Type 2 diabetes mellitus with other circulatory complications: Secondary | ICD-10-CM | POA: Diagnosis not present

## 2019-04-26 DIAGNOSIS — I10 Essential (primary) hypertension: Secondary | ICD-10-CM | POA: Diagnosis not present

## 2019-04-26 LAB — HEMOGLOBIN A1C: Hemoglobin A1C: 6.7

## 2019-05-05 ENCOUNTER — Ambulatory Visit (INDEPENDENT_AMBULATORY_CARE_PROVIDER_SITE_OTHER): Payer: Medicare Other | Admitting: Internal Medicine

## 2019-05-05 ENCOUNTER — Encounter: Payer: Self-pay | Admitting: Internal Medicine

## 2019-05-05 ENCOUNTER — Other Ambulatory Visit: Payer: Self-pay

## 2019-05-05 VITALS — BP 110/90 | HR 100 | Ht 60.0 in | Wt 140.0 lb

## 2019-05-05 DIAGNOSIS — E118 Type 2 diabetes mellitus with unspecified complications: Secondary | ICD-10-CM | POA: Diagnosis not present

## 2019-05-05 DIAGNOSIS — L309 Dermatitis, unspecified: Secondary | ICD-10-CM

## 2019-05-05 MED ORDER — CEPHALEXIN 500 MG PO CAPS: 500.0000 mg | ORAL_CAPSULE | Freq: Four times a day (QID) | ORAL | 0 refills | Status: AC

## 2019-05-05 NOTE — Progress Notes (Signed)
Date:  05/05/2019   Name:  Elizabeth Klein   DOB:  [birth year]   MRN:  [id number]   Chief Complaint: Rash (itches and hurts- is raised and starts and top of L) shoulder and goes halfway down back)  Rash This is a new problem. The current episode started 1 to 4 weeks ago. The affected locations include the back and left shoulder. The rash is characterized by burning, itchiness, peeling and redness. She was exposed to nothing. Pertinent negatives include no fatigue, fever or shortness of breath.    Lab Results  Component Value Date   CREATININE 1.16 (H) 02/23/2019   BUN 23 02/23/2019   NA 140 02/23/2019   K 5.8 (H) 02/23/2019   CL 103 02/23/2019   CO2 22 02/23/2019   Lab Results  Component Value Date   CHOL 115 08/20/2018   HDL 41 08/20/2018   LDLCALC 38 08/20/2018   TRIG 182 (H) 08/20/2018   CHOLHDL 2.8 08/20/2018   Lab Results  Component Value Date   TSH 1.280 08/20/2018   Lab Results  Component Value Date   HGBA1C 6.7 04/26/2019     Review of Systems  Constitutional: Negative for chills, fatigue and fever.  Respiratory: Negative for shortness of breath and wheezing.   Cardiovascular: Negative for chest pain and palpitations.  Skin: Positive for rash.  Psychiatric/Behavioral: Negative for dysphoric mood and sleep disturbance. The patient is not nervous/anxious.     Patient Active Problem List   Diagnosis Date Noted  . Oropharyngeal dysphagia 02/23/2019  . 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 Finger injury, left, sequela 08/20/2018  . Microalbuminuria 04/30/2017  . CKD (chronic kidney disease) stage 3, GFR 30-59 ml/min 04/30/2017  . Type II diabetes mellitus with complication ([place]) [id number]  . Essential hypertension 03/19/2017  . CAD (coronary artery disease) 03/19/2017  . History of kidney stones 03/19/2017  . Psoriasis 03/19/2017  . Hyperlipidemia associated with type 2 diabetes mellitus ([place]) 03/19/2017  . Nocturnal leg  cramps 03/19/2017  . Gait instability 03/19/2017  . Insomnia 03/19/2017  . Allergic rhinitis 03/19/2017    Allergies  Allergen Reactions  . Rutherford Nail [Apremilast] Other (See Comments)    Change in mental status  . Prednisone Other (See Comments)    Intolerance     Past Surgical History:  Procedure Laterality Date  . BREAST BIOPSY Right    neg  . CESAREAN SECTION      Social History   Tobacco Use  . Smoking status: Never Smoker  . Smokeless tobacco: Never Used  Substance Use Topics  . Alcohol use: No  . Drug use: No     Medication list has been reviewed and updated.  Current Meds  Medication Sig  . acetaminophen (TYLENOL) 500 MG tablet Take 1,000 mg by mouth every 8 (eight) hours as needed.  Marland Kitchen aspirin EC 81 MG tablet Take 1 tablet (81 mg total) by mouth daily.  Marland Kitchen atorvastatin (LIPITOR) 80 MG tablet TAKE 1 TABLET BY MOUTH  DAILY  . glimepiride (AMARYL) 4 MG tablet Take 1 tablet (4 mg total) by mouth daily with breakfast. (Patient taking differently: Take 2 mg by mouth daily with breakfast. )  . glucose blood test strip Use to check BS with Accucheck Meter up to 3 times daily for DM2 E11.9  . Halobetasol Prop-Tazarotene (DUOBRII) 0.01-0.045 % LOTN Apply 1 application topically 2 (two) times daily.  . insulin glargine (LANTUS) 100 UNIT/ML injection Inject 0.4 mLs (40 Units  total) into the skin every morning. Pt taking QAM (Patient taking differently: Inject 30 Units into the skin every morning. Pt taking QAM)  . losartan (COZAAR) 50 MG tablet TAKE 1 TABLET BY MOUTH  DAILY  . metFORMIN (GLUCOPHAGE) 1000 MG tablet TAKE 1 TABLET BY MOUTH  TWICE DAILY WITH MEALS  . metoprolol tartrate (LOPRESSOR) 50 MG tablet TAKE 1 TABLET BY MOUTH  TWICE DAILY  . montelukast (SINGULAIR) 10 MG tablet TAKE 1 TABLET BY MOUTH  DAILY  . omeprazole (PRILOSEC) 20 MG capsule TAKE 1 CAPSULE BY MOUTH  DAILY  . ONETOUCH DELICA PLUS LANCETS MISC 1 each by Does not apply route 2 (two) times daily.  .  Semaglutide,0.25 or 0.5MG /DOS, (OZEMPIC, 0.25 OR 0.5 MG/DOSE,) 2 MG/1.5ML SOPN Inject 0.5 mg into the skin once a week.  . Syringe, Disposable, (B-D 30CC SYRINGE) 30 ML MISC Use with Lantus Injections for DM2 ICD 10 E11.9  . traZODone (DESYREL) 50 MG tablet TAKE 1 TABLET BY MOUTH AT  BEDTIME    PHQ 2/9 Scores 05/05/2019 10/07/2018 08/20/2018 09/09/2017  PHQ - 2 Score 0 2 0 1  PHQ- 9 Score 0 3 - 7    BP Readings from Last 3 Encounters:  05/05/19 110/90  02/23/19 124/81  10/07/18 108/72    Physical Exam Vitals and nursing note reviewed.  Constitutional:      General: She is not in acute distress.    Appearance: She is well-developed.  HENT:     Head: Normocephalic and atraumatic.  Cardiovascular:     Rate and Rhythm: Normal rate and regular rhythm.  Pulmonary:     Effort: Pulmonary effort is normal. No respiratory distress.     Breath sounds: No wheezing or rhonchi.  Musculoskeletal:        General: Normal range of motion.  Lymphadenopathy:     Cervical: No cervical adenopathy.  Skin:    General: Skin is warm and dry.     Findings: Erythema and rash present. Rash is crusting and scaling. Rash is not vesicular.       Neurological:     Mental Status: She is alert and oriented to person, place, and time.  Psychiatric:        Behavior: Behavior normal.        Thought Content: Thought content normal.     Wt Readings from Last 3 Encounters:  05/05/19 140 lb (63.5 kg)  02/23/19 149 lb (67.6 kg)  10/07/18 150 lb 6.4 oz (68.2 kg)    BP 110/90   Pulse 100   Ht 5' (1.524 m)   Wt 140 lb (63.5 kg)   SpO2 98%   BMI 27.34 kg/m   Assessment and Plan: 1. Dermatitis Bacterial superinfection of psoriatic lesions Recommend that she use Vaseline to the rash and follow up with Dr. Aubery Lapping - cephALEXin (KEFLEX) 500 MG capsule; Take 1 capsule (500 mg total) by mouth 4 (four) times daily for 10 days.  Dispense: 40 capsule; Refill: 0  2. Type II diabetes mellitus with  complication (HCC) Clinically stable by exam and report without s/s of hypoglycemia. DM complicated by lipids. Tolerating medications insulin and Ozempic well without side effects or other concerns.   Partially dictated using Editor, commissioning. Any errors are unintentional.  Halina Maidens, MD [facility]  05/05/2019

## 2019-05-06 DIAGNOSIS — B029 Zoster without complications: Secondary | ICD-10-CM | POA: Diagnosis not present

## 2019-06-05 ENCOUNTER — Other Ambulatory Visit: Payer: Self-pay | Admitting: Internal Medicine

## 2019-07-23 DIAGNOSIS — H401131 Primary open-angle glaucoma, bilateral, mild stage: Secondary | ICD-10-CM | POA: Diagnosis not present

## 2019-08-06 ENCOUNTER — Encounter: Payer: Self-pay | Admitting: Emergency Medicine

## 2019-08-06 ENCOUNTER — Emergency Department
Admission: EM | Admit: 2019-08-06 | Discharge: 2019-08-06 | Disposition: A | Discharge status: Home / Self Care | Payer: Medicare Other | Attending: Emergency Medicine | Admitting: Emergency Medicine

## 2019-08-06 ENCOUNTER — Other Ambulatory Visit: Payer: Self-pay

## 2019-08-06 ENCOUNTER — Emergency Department: Payer: Medicare Other

## 2019-08-06 DIAGNOSIS — Z794 Long term (current) use of insulin: Secondary | ICD-10-CM | POA: Insufficient documentation

## 2019-08-06 DIAGNOSIS — I129 Hypertensive chronic kidney disease with stage 1 through stage 4 chronic kidney disease, or unspecified chronic kidney disease: Secondary | ICD-10-CM | POA: Insufficient documentation

## 2019-08-06 DIAGNOSIS — N183 Chronic kidney disease, stage 3 unspecified: Secondary | ICD-10-CM | POA: Diagnosis not present

## 2019-08-06 DIAGNOSIS — E1122 Type 2 diabetes mellitus with diabetic chronic kidney disease: Secondary | ICD-10-CM | POA: Insufficient documentation

## 2019-08-06 DIAGNOSIS — R0789 Other chest pain: Secondary | ICD-10-CM | POA: Diagnosis not present

## 2019-08-06 DIAGNOSIS — R079 Chest pain, unspecified: Secondary | ICD-10-CM | POA: Diagnosis not present

## 2019-08-06 LAB — BASIC METABOLIC PANEL
Anion gap: 7 (ref 5–15)
BUN: 24 mg/dL — ABNORMAL HIGH (ref 8–23)
CO2: 24 mmol/L (ref 22–32)
Calcium: 9.2 mg/dL (ref 8.9–10.3)
Chloride: 107 mmol/L (ref 98–111)
Creatinine, Ser: 0.91 mg/dL (ref 0.44–1.00)
GFR calc Af Amer: >60 mL/min (ref >60)
GFR calc non Af Amer: >60 mL/min (ref >60)
Glucose, Bld: 165 mg/dL — ABNORMAL HIGH (ref 70–99)
Potassium: 5.9 mmol/L — ABNORMAL HIGH (ref 3.5–5.1)
Sodium: 138 mmol/L (ref 135–145)

## 2019-08-06 LAB — CBC
HCT: 37.6 % (ref 36.0–46.0)
Hemoglobin: 11.7 g/dL — ABNORMAL LOW (ref 12.0–15.0)
MCH: 29.2 pg (ref 26.0–34.0)
MCHC: 31.1 g/dL (ref 30.0–36.0)
MCV: 93.8 fL (ref 80.0–100.0)
Platelets: 335 10*3/uL (ref 150–400)
RBC: 4.01 MIL/uL (ref 3.87–5.11)
RDW: 13.6 % (ref 11.5–15.5)
WBC: 10.4 10*3/uL (ref 4.0–10.5)
nRBC: 0 % (ref 0.0–0.2)

## 2019-08-06 LAB — TROPONIN I (HIGH SENSITIVITY)
Troponin I (High Sensitivity): 3 ng/L (ref <18)
Troponin I (High Sensitivity): 3 ng/L (ref <18)

## 2019-08-06 MED ORDER — SODIUM CHLORIDE 0.9% FLUSH: 3.0000 mL | Freq: Once | INTRAVENOUS | Status: DC

## 2019-08-06 NOTE — ED Notes (Signed)
See triage note  Presents with some chest pain which was yesterday  Denies any pain at present

## 2019-08-06 NOTE — ED Triage Notes (Signed)
Says she had chest pain yesterday--it was sharp left chest and it went away.  She did feel some heart racing.  Today she had it again.  No pain right now.  No heart racing today.

## 2019-08-06 NOTE — ED Provider Notes (Signed)
[facility] Emergency Department Provider Note   ____________________________________________    I have reviewed the triage vital signs and the nursing notes.   HISTORY  Chief Complaint Chest Pain     HPI Elizabeth Klein is a 69 y.o. female with history of chronic kidney disease, diabetes, high cholesterol, hypertension who presents with complaints of chest discomfort yesterday and this morning. Patient describes it as a brief sharp pain in the left side of her chest, now resolved. It happened 2 times. She denies shortness of breath. No pleurisy. No nausea vomiting diaphoresis. No history of cardiac disease. She does have a strong family history of cardiac disease. Currently feels quite well. Has not take anything for this.   Past Medical History:  Diagnosis Date  . Acid reflux   . Chest pain   . Diabetes mellitus without complication ([place])   . High cholesterol   . Hypertension   . Kidney stones   . Migraines   . Psoriasis   . Rapid heart rate   . Weight loss     Patient Active Problem List   Diagnosis Date Noted  . Oropharyngeal dysphagia 02/23/2019  . Neuropathy due to type 2 diabetes mellitus ([place]) 08/20/2018  . Finger injury, left, sequela 08/20/2018  . Microalbuminuria 04/30/2017  . CKD (chronic kidney disease) stage 3, GFR 30-59 ml/min 04/30/2017  . Type II diabetes mellitus with complication ([place]) 92/33/0076  . Essential hypertension 03/19/2017  . CAD (coronary artery disease) 03/19/2017  . History of kidney stones 03/19/2017  . Psoriasis 03/19/2017  . Hyperlipidemia associated with type 2 diabetes mellitus ([place]) 03/19/2017  . Nocturnal leg cramps 03/19/2017  . Gait instability 03/19/2017  . Insomnia 03/19/2017  . Allergic rhinitis 03/19/2017    Past Surgical History:  Procedure Laterality Date  . BREAST BIOPSY Right    neg  . CESAREAN SECTION      Prior to Admission medications   Medication Sig Start Date End Date  Taking? Authorizing Provider  acetaminophen (TYLENOL) 500 MG tablet Take 1,000 mg by mouth every 8 (eight) hours as needed.    [provider]  aspirin EC 81 MG tablet Take 1 tablet (81 mg total) by mouth daily. 07/01/17   Juline Patch, MD  atorvastatin (LIPITOR) 80 MG tablet TAKE 1 TABLET BY MOUTH  DAILY 11/04/18   Glean Hess, MD  glimepiride (AMARYL) 4 MG tablet Take 1 tablet (4 mg total) by mouth daily with breakfast. Patient taking differently: Take 2 mg by mouth daily with breakfast.  04/23/18   Glean Hess, MD  glucose blood (ACCU-CHEK SMARTVIEW) test strip USE 3 TIMES DAILY 11/06/18   [provider]  glucose blood test strip Use to check BS with Accucheck Meter up to 3 times daily for DM2 E11.9 07/01/17   Juline Patch, MD  Halobetasol Prop-Tazarotene (DUOBRII) 0.01-0.045 % LOTN Apply 1 application topically 2 (two) times daily. 08/10/18   Glean Hess, MD  insulin glargine (LANTUS) 100 UNIT/ML injection Inject 0.4 mLs (40 Units total) into the skin every morning. Pt taking QAM Patient taking differently: Inject 30 Units into the skin every morning. Pt taking QAM 01/25/19   Glean Hess, MD  losartan (COZAAR) 50 MG tablet TAKE 1 TABLET BY MOUTH  DAILY 07/29/18   Glean Hess, MD  metFORMIN (GLUCOPHAGE) 1000 MG tablet TAKE 1 TABLET BY MOUTH  TWICE DAILY WITH MEALS 12/23/18   Glean Hess, MD  metoprolol tartrate (LOPRESSOR) 50  MG tablet TAKE 1 TABLET BY MOUTH  TWICE DAILY 09/18/18   Glean Hess, MD  montelukast (SINGULAIR) 10 MG tablet TAKE 1 TABLET BY MOUTH  DAILY 12/23/18   Glean Hess, MD  omeprazole (PRILOSEC) 20 MG capsule TAKE 1 CAPSULE BY MOUTH  DAILY 11/04/18   Glean Hess, MD  [facility] DELICA PLUS LANCETS MISC 1 each by Does not apply route 2 (two) times daily. 10/03/17   Glean Hess, MD  Semaglutide,0.25 or 0.5MG /DOS, (OZEMPIC, 0.25 OR 0.5 MG/DOSE,) 2 MG/1.5ML SOPN Inject 0.5 mg into the skin once a week.    [provider]  Syringe, Disposable, (B-D 30CC SYRINGE) 30 ML MISC Use with Lantus Injections for DM2 ICD 10 E11.9 08/10/18   Glean Hess, MD  traZODone (DESYREL) 50 MG tablet TAKE 1 TABLET BY MOUTH AT  BEDTIME 11/09/18   Glean Hess, MD     Allergies Prednisone  Family History  Problem Relation Age of Onset  . Breast cancer Neg Hx   . Diabetes Brother   . Heart attack Brother   . Diabetes Brother   . Heart attack Brother   . Diabetes Brother     Social History Social History   Tobacco Use  . Smoking status: Never Smoker  . Smokeless tobacco: Never Used  Vaping Use  . Vaping Use: Never used  Substance Use Topics  . Alcohol use: No  . Drug use: No    Review of Systems  Constitutional: No fever/chills Eyes: No visual changes.  ENT: No sore throat. Cardiovascular: As above Respiratory: Denies shortness of breath. Gastrointestinal: No abdominal pain.  Genitourinary: Negative for dysuria. Musculoskeletal: Negative for back pain. Skin: Negative for rash. Neurological: Negative for headaches    ____________________________________________   PHYSICAL EXAM:  VITAL SIGNS: ED Triage Vitals  Enc Vitals Group     BP 08/06/19 1008 132/67     Pulse Rate 08/06/19 1008 81     Resp 08/06/19 1008 16     Temp 08/06/19 1008 98.5 F (36.9 C)     Temp src --      SpO2 08/06/19 1008 98 %     Weight 08/06/19 1017 63.5 kg (140 lb)     Height 08/06/19 1017 1.524 m (5')     Head Circumference --      Peak Flow --      Pain Score 08/06/19 1017 0     Pain Loc --      Pain Edu? --      Excl. in [place]? --     Constitutional: Alert and oriented.   Nose: No congestion/rhinnorhea. Mouth/Throat: Mucous membranes are moist.    Cardiovascular: Normal rate, regular rhythm. Grossly normal heart sounds.  Good peripheral circulation. Respiratory: Normal respiratory effort.  No retractions. Lungs CTAB. Gastrointestinal: Soft and nontender. No distention.  No CVA  tenderness.  Musculoskeletal: No lower extremity tenderness nor edema.  Warm and well perfused Neurologic:  Normal speech and language. No gross focal neurologic deficits are appreciated.  Skin:  Skin is warm, dry and intact. No rash noted. Psychiatric: Mood and affect are normal. Speech and behavior are normal.  ____________________________________________   LABS (all labs ordered are listed, but only abnormal results are displayed)  Labs Reviewed  BASIC METABOLIC PANEL - Abnormal; Notable for the following components:      Result Value   Potassium 5.9 (*)    Glucose, Bld 165 (*)    BUN 24 (*)  All other components within normal limits  CBC - Abnormal; Notable for the following components:   Hemoglobin 11.7 (*)    All other components within normal limits  TROPONIN I (HIGH SENSITIVITY)  TROPONIN I (HIGH SENSITIVITY)   ____________________________________________  EKG  ED ECG REPORT I, Lavonia Drafts, the attending physician, personally viewed and interpreted this ECG.  Date: 08/06/2019  Rhythm: normal sinus rhythm QRS Axis: normal Intervals: normal ST/T Wave abnormalities: normal Narrative Interpretation: no evidence of acute ischemia  ____________________________________________  RADIOLOGY  Chest x-ray reviewed by me, no infiltrate ____________________________________________   PROCEDURES  Procedure(s) performed: No  Procedures   Critical Care performed: No ____________________________________________   INITIAL IMPRESSION / ASSESSMENT AND PLAN / ED COURSE  Pertinent labs & imaging results that were available during my care of the patient were reviewed by me and considered in my medical decision making (see chart for details).  Patient well-appearing in no acute distress.  Doubt ACS given history, EKG is reassuring.  Troponin negative x2.  Not consistent with PE or dissection.  Possible esophagitis.  Lab work is overall reassuring, the patient does  have an elevated potassium however in reviewing records her potassium has been elevated for greater than 1 year to a similar level, did not see any medications that she takes that may cause this.  Recommended close follow-up with PCP to reevaluate potassium levels.  Given strong family history of heart disease I will refer her to cardiology for further cardiac work-up.  Strict return precautions discussed.    ____________________________________________   FINAL CLINICAL IMPRESSION(S) / ED DIAGNOSES  Final diagnoses:  Atypical chest pain        Note:  This document was prepared using Dragon voice recognition software and may include unintentional dictation errors.   Lavonia Drafts, MD 08/06/19 1450

## 2019-08-11 DIAGNOSIS — I208 Other forms of angina pectoris: Secondary | ICD-10-CM | POA: Diagnosis not present

## 2019-08-11 DIAGNOSIS — R002 Palpitations: Secondary | ICD-10-CM | POA: Diagnosis not present

## 2019-08-11 DIAGNOSIS — E1142 Type 2 diabetes mellitus with diabetic polyneuropathy: Secondary | ICD-10-CM | POA: Diagnosis not present

## 2019-08-11 DIAGNOSIS — I2510 Atherosclerotic heart disease of native coronary artery without angina pectoris: Secondary | ICD-10-CM | POA: Diagnosis not present

## 2019-08-11 DIAGNOSIS — R0609 Other forms of dyspnea: Secondary | ICD-10-CM | POA: Diagnosis not present

## 2019-08-22 ENCOUNTER — Other Ambulatory Visit: Payer: Self-pay | Admitting: Internal Medicine

## 2019-08-23 ENCOUNTER — Other Ambulatory Visit
Admission: RE | Admit: 2019-08-23 | Discharge: 2019-08-23 | Disposition: A | Discharge status: Home / Self Care | Payer: Medicare Other | Attending: Internal Medicine | Admitting: Internal Medicine

## 2019-08-23 ENCOUNTER — Other Ambulatory Visit: Payer: Self-pay

## 2019-08-23 ENCOUNTER — Ambulatory Visit (INDEPENDENT_AMBULATORY_CARE_PROVIDER_SITE_OTHER): Payer: Medicare Other | Admitting: Internal Medicine

## 2019-08-23 ENCOUNTER — Encounter: Payer: Self-pay | Admitting: Internal Medicine

## 2019-08-23 VITALS — BP 118/76 | HR 86 | Temp 98.6°F | Ht 60.0 in | Wt 140.0 lb

## 2019-08-23 DIAGNOSIS — E1169 Type 2 diabetes mellitus with other specified complication: Secondary | ICD-10-CM | POA: Diagnosis not present

## 2019-08-23 DIAGNOSIS — N183 Chronic kidney disease, stage 3 unspecified: Secondary | ICD-10-CM | POA: Diagnosis not present

## 2019-08-23 DIAGNOSIS — Z0000 Encounter for general adult medical examination without abnormal findings: Secondary | ICD-10-CM

## 2019-08-23 DIAGNOSIS — I129 Hypertensive chronic kidney disease with stage 1 through stage 4 chronic kidney disease, or unspecified chronic kidney disease: Secondary | ICD-10-CM | POA: Diagnosis not present

## 2019-08-23 DIAGNOSIS — Z1231 Encounter for screening mammogram for malignant neoplasm of breast: Secondary | ICD-10-CM | POA: Diagnosis not present

## 2019-08-23 DIAGNOSIS — R079 Chest pain, unspecified: Secondary | ICD-10-CM

## 2019-08-23 DIAGNOSIS — I10 Essential (primary) hypertension: Secondary | ICD-10-CM

## 2019-08-23 DIAGNOSIS — E118 Type 2 diabetes mellitus with unspecified complications: Secondary | ICD-10-CM | POA: Insufficient documentation

## 2019-08-23 DIAGNOSIS — E1122 Type 2 diabetes mellitus with diabetic chronic kidney disease: Secondary | ICD-10-CM | POA: Insufficient documentation

## 2019-08-23 DIAGNOSIS — E785 Hyperlipidemia, unspecified: Secondary | ICD-10-CM

## 2019-08-23 LAB — CBC WITH DIFFERENTIAL/PLATELET
Abs Immature Granulocytes: 0.03 10*3/uL (ref 0.00–0.07)
Basophils Absolute: 0.1 10*3/uL (ref 0.0–0.1)
Basophils Relative: 1 %
Eosinophils Absolute: 0.2 10*3/uL (ref 0.0–0.5)
Eosinophils Relative: 1 %
HCT: 36.8 % (ref 36.0–46.0)
Hemoglobin: 11.7 g/dL — ABNORMAL LOW (ref 12.0–15.0)
Immature Granulocytes: 0 %
Lymphocytes Relative: 24 %
Lymphs Abs: 2.5 10*3/uL (ref 0.7–4.0)
MCH: 29.1 pg (ref 26.0–34.0)
MCHC: 31.8 g/dL (ref 30.0–36.0)
MCV: 91.5 fL (ref 80.0–100.0)
Monocytes Absolute: 0.6 10*3/uL (ref 0.1–1.0)
Monocytes Relative: 6 %
Neutro Abs: 7.4 10*3/uL (ref 1.7–7.7)
Neutrophils Relative %: 68 %
Platelets: 306 10*3/uL (ref 150–400)
RBC: 4.02 MIL/uL (ref 3.87–5.11)
RDW: 13.4 % (ref 11.5–15.5)
WBC: 10.7 10*3/uL — ABNORMAL HIGH (ref 4.0–10.5)
nRBC: 0 % (ref 0.0–0.2)

## 2019-08-23 LAB — POCT URINALYSIS DIPSTICK
Bilirubin, UA: NEGATIVE
Blood, UA: NEGATIVE
Glucose, UA: NEGATIVE
Ketones, UA: NEGATIVE
Leukocytes, UA: NEGATIVE
Nitrite, UA: NEGATIVE
Protein, UA: POSITIVE — AB
Spec Grav, UA: 1.025 (ref 1.010–1.025)
Urobilinogen, UA: 0.2 E.U./dL
pH, UA: 5 (ref 5.0–8.0)

## 2019-08-23 LAB — COMPREHENSIVE METABOLIC PANEL
ALT: 28 U/L (ref 0–44)
AST: 23 U/L (ref 15–41)
Albumin: 3.9 g/dL (ref 3.5–5.0)
Alkaline Phosphatase: 83 U/L (ref 38–126)
Anion gap: 7 (ref 5–15)
BUN: 25 mg/dL — ABNORMAL HIGH (ref 8–23)
CO2: 23 mmol/L (ref 22–32)
Calcium: 9 mg/dL (ref 8.9–10.3)
Chloride: 108 mmol/L (ref 98–111)
Creatinine, Ser: 1.08 mg/dL — ABNORMAL HIGH (ref 0.44–1.00)
GFR calc Af Amer: >60 mL/min (ref >60)
GFR calc non Af Amer: 53 mL/min — ABNORMAL LOW (ref >60)
Glucose, Bld: 97 mg/dL (ref 70–99)
Potassium: 4.8 mmol/L (ref 3.5–5.1)
Sodium: 138 mmol/L (ref 135–145)
Total Bilirubin: 0.6 mg/dL (ref 0.3–1.2)
Total Protein: 7.8 g/dL (ref 6.5–8.1)

## 2019-08-23 LAB — HEMOGLOBIN A1C
Hgb A1c MFr Bld: 7.4 % — ABNORMAL HIGH (ref 4.8–5.6)
Mean Plasma Glucose: 165.68 mg/dL

## 2019-08-23 LAB — TSH: TSH: 1.271 u[IU]/mL (ref 0.350–4.500)

## 2019-08-23 NOTE — Progress Notes (Signed)
Date:  08/23/2019   Name:  Elizabeth Klein   DOB:  [birth year]   MRN:  [id number]   Chief Complaint: Annual Exam (Breast Exam. Foot exam.)  Elizabeth Klein is a 69 y.o. female who presents today for her Complete Annual Exam. She feels fairly well. She reports exercising walking. She reports she is sleeping fairly well. Breast complaints are none. She has upcoming cardiac workup for chest pain.   Mammogram: 09/2018 DEXA 09/2018 osteopenia Pap smear: discontinued Colonoscopy:  09/2015 Immunization History  Administered Date(s) Administered   Influenza, High Dose Seasonal PF 01/07/2018, 12/02/2018   Influenza-Unspecified 10/26/2016   PFIZER SARS-COV-2 Vaccination 04/21/2019, 05/12/2019   Pneumococcal Conjugate-13 03/19/2017   Pneumococcal Polysaccharide-23 08/20/2018   Zoster Recombinat (Shingrix) 04/30/2017    Diabetes She presents for her follow-up diabetic visit. She has type 2 diabetes mellitus. Her disease course has been stable. Pertinent negatives for hypoglycemia include no dizziness, headaches, nervousness/anxiousness or tremors. Associated symptoms include chest pain. Pertinent negatives for diabetes include no fatigue, no polydipsia and no polyuria. Current diabetic treatment includes oral agent (dual therapy) and insulin injections. She is compliant with treatment all of the time. An ACE inhibitor/angiotensin II receptor blocker is being taken. Eye exam is current.  Hypertension This is a chronic problem. The problem is controlled. Associated symptoms include chest pain. Pertinent negatives include no headaches, palpitations or shortness of breath. Past treatments include angiotensin blockers and beta blockers. The current treatment provides significant improvement. There are no compliance problems.  Hypertensive end-organ damage includes kidney disease.  Hyperlipidemia The problem is controlled. Associated symptoms include chest pain. Pertinent negatives include no  shortness of breath. Current antihyperlipidemic treatment includes statins. The current treatment provides significant improvement of lipids.  Chest Pain  This is a new problem. The current episode started 1 to 4 weeks ago. The problem occurs intermittently. The problem has been unchanged. The pain is present in the substernal region and lateral region. The pain is mild. The pain does not radiate. Pertinent negatives include no abdominal pain, cough, dizziness, fever, headaches, palpitations, shortness of breath or vomiting.  Her past medical history is significant for hyperlipidemia and hypertension. Prior workup: went to ER and is now scheduled for stress testing this week.    Lab Results  Component Value Date   CREATININE 0.91 08/06/2019   BUN 24 (H) 08/06/2019   NA 138 08/06/2019   K 5.9 (H) 08/06/2019   CL 107 08/06/2019   CO2 24 08/06/2019   Lab Results  Component Value Date   CHOL 115 08/20/2018   HDL 41 08/20/2018   LDLCALC 38 08/20/2018   TRIG 182 (H) 08/20/2018   CHOLHDL 2.8 08/20/2018   Lab Results  Component Value Date   TSH 1.280 08/20/2018   Lab Results  Component Value Date   HGBA1C 6.7 04/26/2019   Lab Results  Component Value Date   WBC 10.4 08/06/2019   HGB 11.7 (L) 08/06/2019   HCT 37.6 08/06/2019   MCV 93.8 08/06/2019   PLT 335 08/06/2019   Lab Results  Component Value Date   ALT 33 (H) 02/23/2019   AST 32 02/23/2019   ALKPHOS 95 02/23/2019   BILITOT 0.3 02/23/2019     Review of Systems  Constitutional: Negative for chills, fatigue and fever.  HENT: Negative for congestion, hearing loss, tinnitus, trouble swallowing and voice change.   Eyes: Negative for visual disturbance.  Respiratory: Negative for cough, chest tightness, shortness of breath and wheezing.   Cardiovascular: Positive  for chest pain. Negative for palpitations and leg swelling.  Gastrointestinal: Negative for abdominal pain, constipation, diarrhea and vomiting.  Endocrine:  Negative for polydipsia and polyuria.  Genitourinary: Negative for dysuria, frequency, genital sores, vaginal bleeding and vaginal discharge.  Musculoskeletal: Negative for arthralgias, gait problem and joint swelling.  Skin: Positive for rash (recurrent rash in scalp). Negative for color change.  Neurological: Negative for dizziness, tremors, light-headedness and headaches.  Hematological: Negative for adenopathy. Does not bruise/bleed easily.  Psychiatric/Behavioral: Negative for dysphoric mood and sleep disturbance. The patient is not nervous/anxious.     Patient Active Problem List   Diagnosis Date Noted   Oropharyngeal dysphagia 02/23/2019   Neuropathy due to type 2 diabetes mellitus ([place]) 08/20/2018   Finger injury, left, sequela 08/20/2018   Microalbuminuria 04/30/2017   CKD (chronic kidney disease) stage 3, GFR 30-59 ml/min 04/30/2017   Type II diabetes mellitus with complication ([place]) 09/98/3382   Essential hypertension 03/19/2017   CAD (coronary artery disease) 03/19/2017   History of kidney stones 03/19/2017   Psoriasis 03/19/2017   Hyperlipidemia associated with type 2 diabetes mellitus ([place]) 03/19/2017   Nocturnal leg cramps 03/19/2017   Gait instability 03/19/2017   Insomnia 03/19/2017   Allergic rhinitis 03/19/2017    Allergies  Allergen Reactions   Prednisone Other (See Comments)    Intolerance     Past Surgical History:  Procedure Laterality Date   BREAST BIOPSY Right    neg   CESAREAN SECTION      Social History   Tobacco Use   Smoking status: Never Smoker   Smokeless tobacco: Never Used  Vaping Use   Vaping Use: Never used  Substance Use Topics   Alcohol use: No   Drug use: No     Medication list has been reviewed and updated.  Current Meds  Medication Sig   acetaminophen (TYLENOL) 500 MG tablet Take 1,000 mg by mouth every 8 (eight) hours as needed.   aspirin EC 81 MG tablet Take 1 tablet (81 mg total) by mouth  daily.   atorvastatin (LIPITOR) 80 MG tablet TAKE 1 TABLET BY MOUTH  DAILY   clobetasol ointment (TEMOVATE) 5.05 % Apply 1 application topically 2 (two) times daily.   Cysteamine Bitartrate (PROCYSBI) 300 MG PACK Use   glimepiride (AMARYL) 4 MG tablet Take 1 tablet (4 mg total) by mouth daily with breakfast. (Patient taking differently: Take 2 mg by mouth daily with breakfast. )   glucose blood (ACCU-CHEK SMARTVIEW) test strip USE 3 TIMES DAILY   glucose blood test strip Use to check BS with Accucheck Meter up to 3 times daily for DM2 E11.9   Halobetasol Prop-Tazarotene (DUOBRII) 0.01-0.045 % LOTN Apply 1 application topically 2 (two) times daily.   insulin glargine (LANTUS) 100 UNIT/ML injection Inject 0.4 mLs (40 Units total) into the skin every morning. Pt taking QAM (Patient taking differently: Inject 26 Units into the skin every morning. Pt taking QAM)   Latanoprost 0.005 % EMUL Apply to eye.   losartan (COZAAR) 50 MG tablet TAKE 1 TABLET BY MOUTH  DAILY   metFORMIN (GLUCOPHAGE) 1000 MG tablet TAKE 1 TABLET BY MOUTH  TWICE DAILY WITH MEALS   metoprolol tartrate (LOPRESSOR) 50 MG tablet TAKE 1 TABLET BY MOUTH  TWICE DAILY   montelukast (SINGULAIR) 10 MG tablet TAKE 1 TABLET BY MOUTH  DAILY   omeprazole (PRILOSEC) 20 MG capsule TAKE 1 CAPSULE BY MOUTH  DAILY   ONETOUCH DELICA PLUS LANCETS MISC 1 each by Does not apply  route 2 (two) times daily.   Semaglutide,0.25 or 0.5MG /DOS, (OZEMPIC, 0.25 OR 0.5 MG/DOSE,) 2 MG/1.5ML SOPN Inject 0.5 mg into the skin once a week.   Syringe, Disposable, (B-D 30CC SYRINGE) 30 ML MISC Use with Lantus Injections for DM2 ICD 10 E11.9   traZODone (DESYREL) 50 MG tablet TAKE 1 TABLET BY MOUTH AT  BEDTIME    PHQ 2/9 Scores 08/23/2019 05/05/2019 10/07/2018 08/20/2018  PHQ - 2 Score 0 0 2 0  PHQ- 9 Score 4 0 3 -    GAD 7 : Generalized Anxiety Score 08/23/2019 05/05/2019  Nervous, Anxious, on Edge 1 0  Control/stop worrying 0 1  Worry too much -  different things 0 1  Trouble relaxing 0 0  Restless 0 0  Easily annoyed or irritable 1 0  Afraid - awful might happen 0 0  Total GAD 7 Score 2 2  Anxiety Difficulty Not difficult at all Not difficult at all    BP Readings from Last 3 Encounters:  08/23/19 118/76  08/06/19 132/67  05/05/19 110/90    Physical Exam Vitals and nursing note reviewed.  Constitutional:      General: She is not in acute distress.    Appearance: She is well-developed.  HENT:     Head: Normocephalic and atraumatic.     Right Ear: Tympanic membrane and ear canal normal.     Left Ear: Tympanic membrane and ear canal normal.     Nose:     Right Sinus: No maxillary sinus tenderness.     Left Sinus: No maxillary sinus tenderness.  Eyes:     General: No scleral icterus.       Right eye: No discharge.        Left eye: No discharge.     Conjunctiva/sclera: Conjunctivae normal.  Neck:     Thyroid: No thyromegaly.     Vascular: No carotid bruit.  Cardiovascular:     Rate and Rhythm: Normal rate and regular rhythm.     Pulses: Normal pulses.     Heart sounds: Normal heart sounds.  Pulmonary:     Effort: Pulmonary effort is normal. No respiratory distress.     Breath sounds: No wheezing.  Chest:     Breasts:        Right: No mass, nipple discharge, skin change or tenderness.        Left: No mass, nipple discharge, skin change or tenderness.  Abdominal:     General: Bowel sounds are normal.     Palpations: Abdomen is soft.     Tenderness: There is no abdominal tenderness.  Musculoskeletal:        General: Normal range of motion.     Cervical back: Normal range of motion. No erythema.     Right lower leg: No edema.     Left lower leg: No edema.  Lymphadenopathy:     Cervical: No cervical adenopathy.  Skin:    General: Skin is warm and dry.     Capillary Refill: Capillary refill takes less than 2 seconds.     Findings: Lesion (excoriated lesions along hairline at forehead) present. No rash.    Neurological:     General: No focal deficit present.     Mental Status: She is alert and oriented to person, place, and time.     Cranial Nerves: No cranial nerve deficit.     Sensory: No sensory deficit.     Deep Tendon Reflexes: Reflexes are normal and symmetric.  Psychiatric:  Attention and Perception: Attention normal.        Mood and Affect: Mood normal.     Wt Readings from Last 3 Encounters:  08/23/19 140 lb (63.5 kg)  08/06/19 140 lb (63.5 kg)  05/05/19 140 lb (63.5 kg)    BP 118/76 (BP Location: Right Arm, Patient Position: Sitting, Cuff Size: Normal)    Pulse 86    Temp 98.6 F (37 C) (Oral)    Ht 5' (1.524 m)    Wt 140 lb (63.5 kg)    SpO2 98%    BMI 27.34 kg/m   Assessment and Plan: 1. Annual physical exam Normal exam except for weight Continue healthy diet, exercise - POCT urinalysis dipstick  2. Encounter for screening mammogram for breast cancer Schedule at [place]; Future  3. Essential hypertension Clinically stable exam with well controlled BP on losartan and metoprolol. Tolerating medications without side effects at this time. Pt to continue current regimen and low sodium diet; benefits of regular exercise as able discussed. - CBC with Differential/Platelet - TSH  4. Type II diabetes mellitus with complication ([facility]) Clinically stable by exam and report without s/s of hypoglycemia. DM complicated by HTN. Tolerating medications well without side effects or other concerns.  Followed by endocrinology - Comprehensive metabolic panel - Hemoglobin A1c  5. Hyperlipidemia associated with type 2 diabetes mellitus ([place]) Tolerating statin medication without side effects at this time LDL is at goal of < 70 on high dose statin Continue same therapy without change at this time.  6. Stage 3 chronic kidney disease, unspecified whether stage 3a or 3b CKD - Comprehensive metabolic panel  7. Chest pain at rest Continue ASA,  beta blocker Workup with Dr. Clayborn Bigness   Partially dictated using Dragon software. Any errors are unintentional.  Halina Maidens, MD [facility]  08/23/2019

## 2019-08-25 DIAGNOSIS — I208 Other forms of angina pectoris: Secondary | ICD-10-CM | POA: Diagnosis not present

## 2019-08-25 DIAGNOSIS — R0609 Other forms of dyspnea: Secondary | ICD-10-CM | POA: Diagnosis not present

## 2019-09-06 DIAGNOSIS — E785 Hyperlipidemia, unspecified: Secondary | ICD-10-CM | POA: Diagnosis not present

## 2019-09-06 DIAGNOSIS — Z794 Long term (current) use of insulin: Secondary | ICD-10-CM | POA: Diagnosis not present

## 2019-09-06 DIAGNOSIS — E1142 Type 2 diabetes mellitus with diabetic polyneuropathy: Secondary | ICD-10-CM | POA: Diagnosis not present

## 2019-09-06 DIAGNOSIS — E1169 Type 2 diabetes mellitus with other specified complication: Secondary | ICD-10-CM | POA: Diagnosis not present

## 2019-09-06 DIAGNOSIS — E1159 Type 2 diabetes mellitus with other circulatory complications: Secondary | ICD-10-CM | POA: Diagnosis not present

## 2019-09-06 LAB — LIPID PANEL
Cholesterol: 111 (ref 0–200)
HDL: 44 (ref 35–70)
LDL Cholesterol: 49
Triglycerides: 91 (ref 40–160)

## 2019-09-13 DIAGNOSIS — H401121 Primary open-angle glaucoma, left eye, mild stage: Secondary | ICD-10-CM | POA: Diagnosis not present

## 2019-09-13 DIAGNOSIS — H2512 Age-related nuclear cataract, left eye: Secondary | ICD-10-CM | POA: Diagnosis not present

## 2019-09-13 DIAGNOSIS — E119 Type 2 diabetes mellitus without complications: Secondary | ICD-10-CM | POA: Diagnosis not present

## 2019-09-15 DIAGNOSIS — I2510 Atherosclerotic heart disease of native coronary artery without angina pectoris: Secondary | ICD-10-CM | POA: Diagnosis not present

## 2019-09-15 DIAGNOSIS — I208 Other forms of angina pectoris: Secondary | ICD-10-CM | POA: Diagnosis not present

## 2019-09-15 DIAGNOSIS — E1142 Type 2 diabetes mellitus with diabetic polyneuropathy: Secondary | ICD-10-CM | POA: Diagnosis not present

## 2019-09-15 DIAGNOSIS — R0609 Other forms of dyspnea: Secondary | ICD-10-CM | POA: Diagnosis not present

## 2019-09-15 DIAGNOSIS — R002 Palpitations: Secondary | ICD-10-CM | POA: Diagnosis not present

## 2019-09-16 ENCOUNTER — Other Ambulatory Visit: Payer: Self-pay | Admitting: Internal Medicine

## 2019-09-17 IMAGING — CT CT ABD-PELV W/ CM
2 of 5 series · 16 of 46 positions shown, 18 images · IV contrast (APPLIED)
Comparison: None.

CLINICAL DATA: History of diabetes and kidney stones. Abdominal
pain. Pain worse on the left.

EXAM:
CT ABDOMEN AND PELVIS WITH CONTRAST
TECHNIQUE: Multidetector CT imaging of the abdomen and pelvis was performed
using the standard protocol following bolus administration of
intravenous contrast.
CONTRAST:  75mL OMNIPAQUE IOHEXOL 300 MG/ML  SOLN

[Series 2: routine abd/pel with · axial · 0.71mm/px · z∈[-434,-19]mm · 13 of 95 slices shown, 15 images]
[im 6/95  soft-tissue]
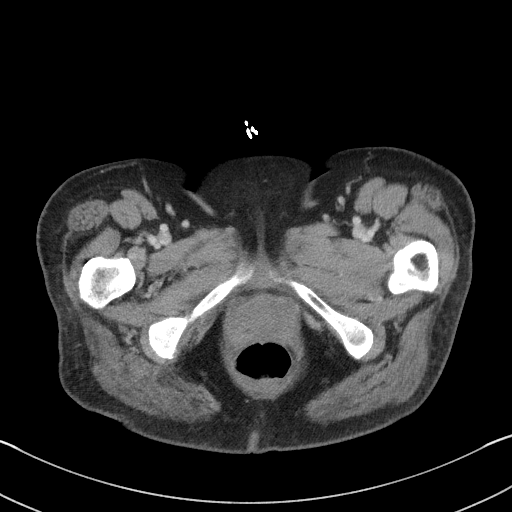
[im 6/95  bone]
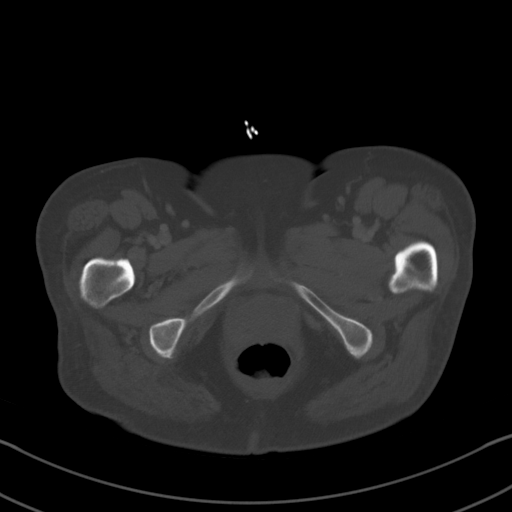
[im 12/95  soft-tissue]
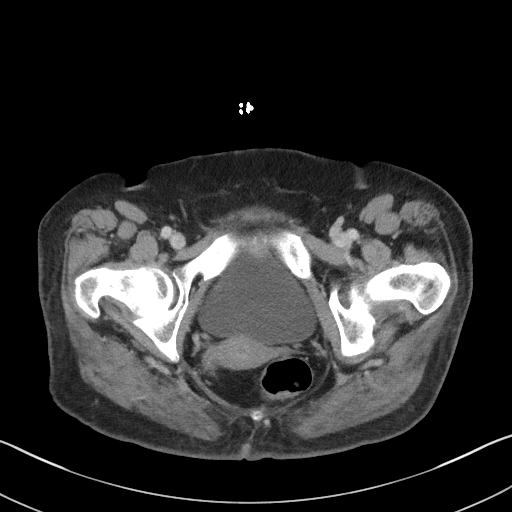
[im 23/95  soft-tissue]
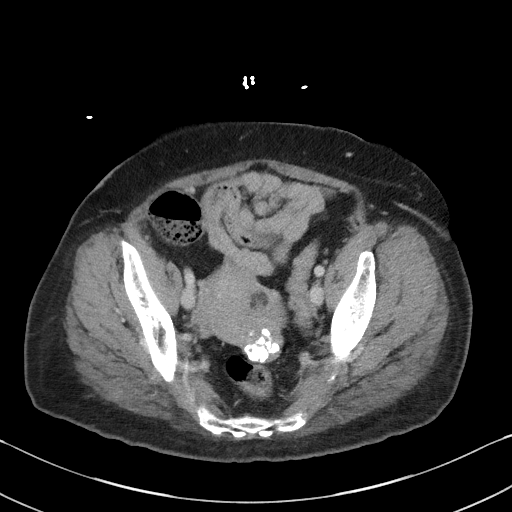
[im 28/95  soft-tissue]
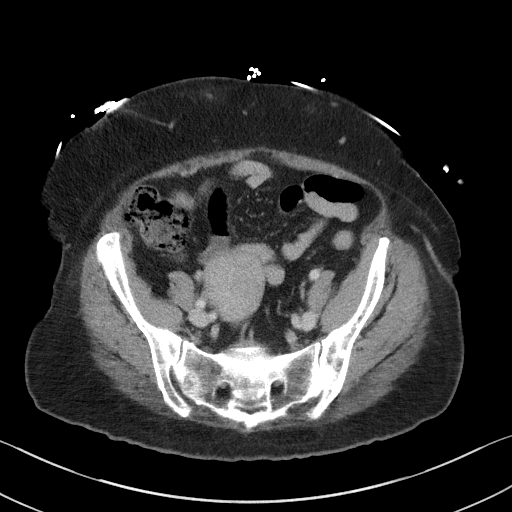
[im 34/95  soft-tissue]
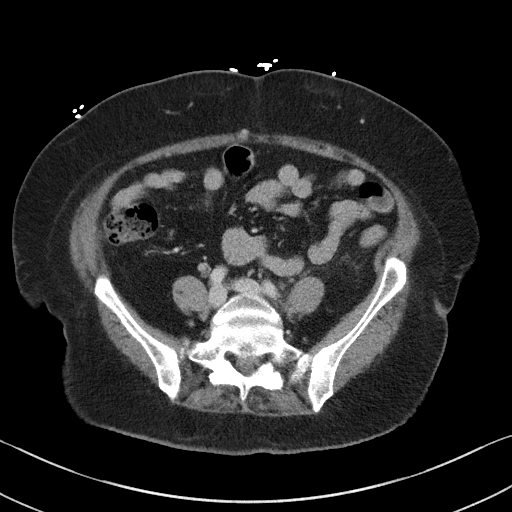
[im 39/95  soft-tissue]
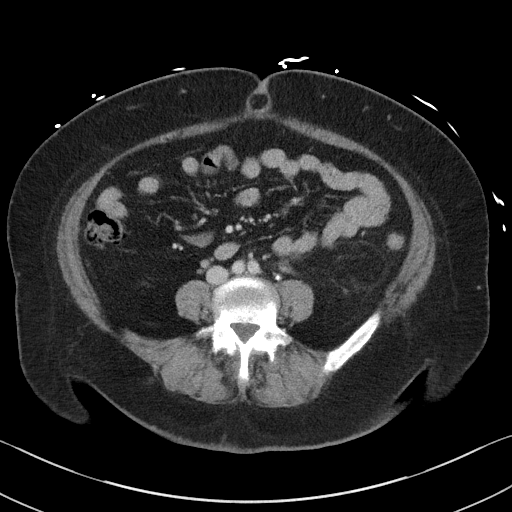
[im 50/95  soft-tissue]
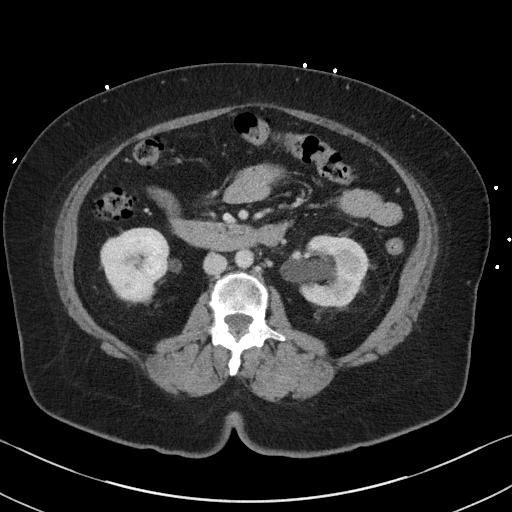
[im 56/95  soft-tissue]
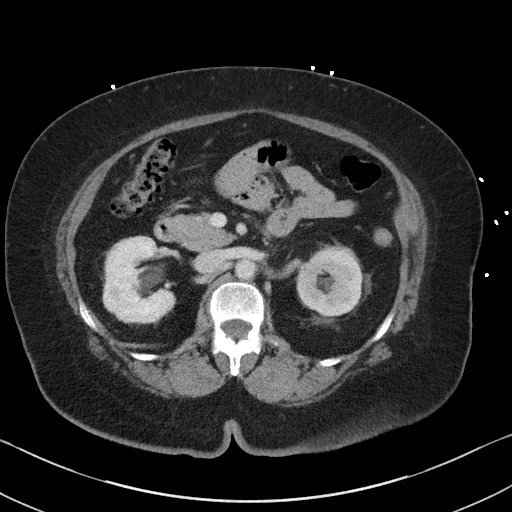
[im 61/95  soft-tissue]
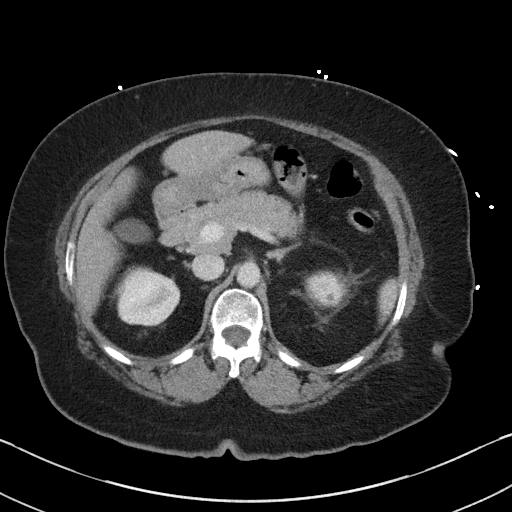
[im 61/95  bone]
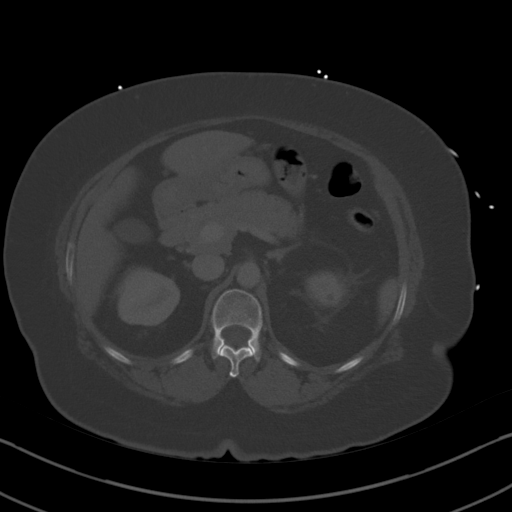
[im 67/95  soft-tissue]
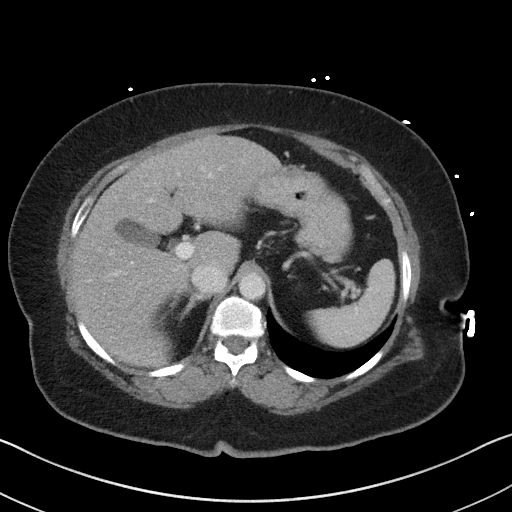
[im 72/95  soft-tissue]
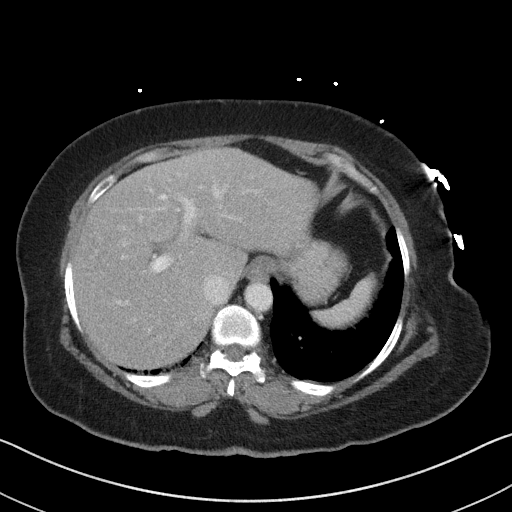
[im 83/95  soft-tissue]
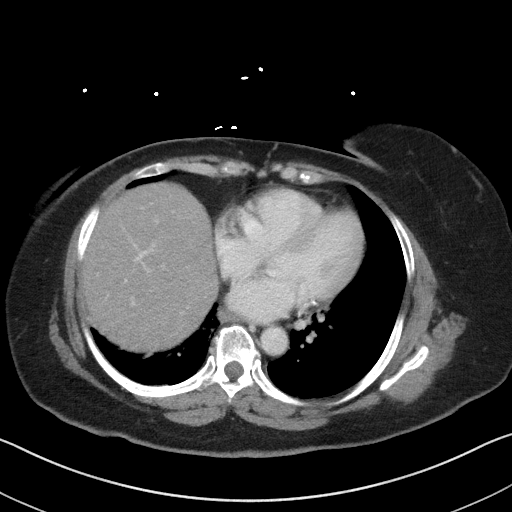
[im 89/95  soft-tissue]
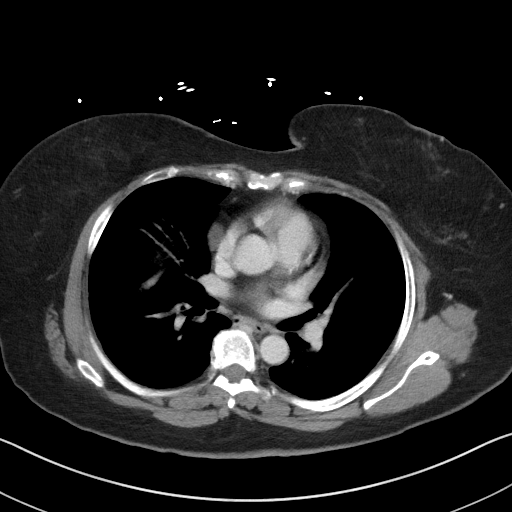

[Series 5: coronal st · coronal · 0.68mm/px · 3 of 96 slices shown]
[im 32/96  soft-tissue]
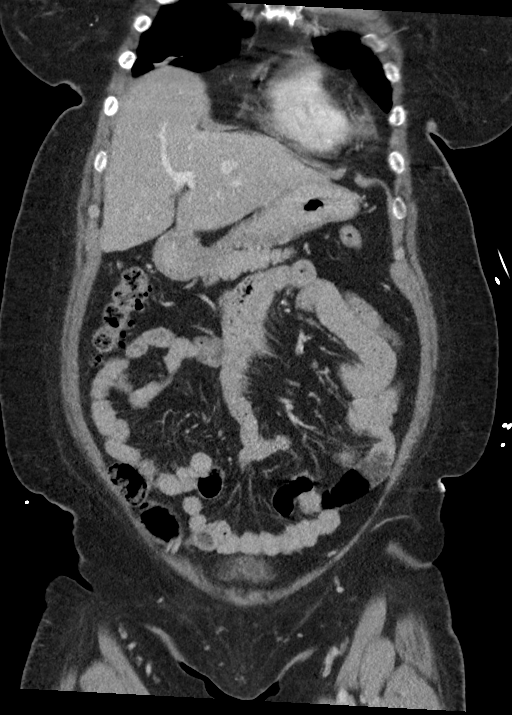
[im 43/96  soft-tissue]
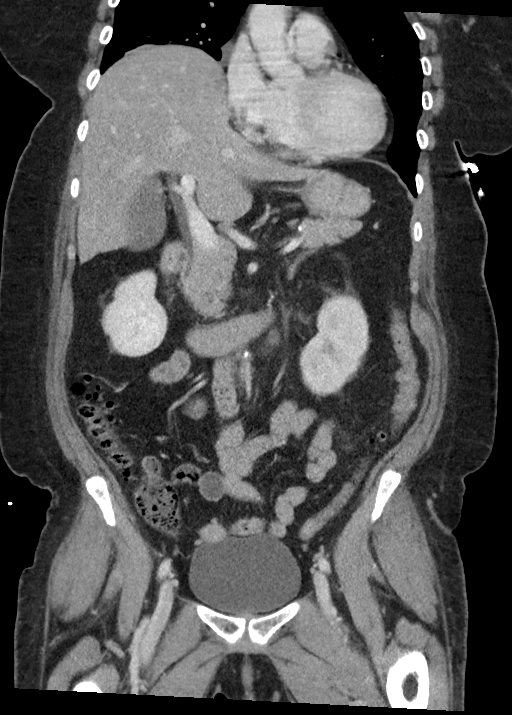
[im 53/96  soft-tissue]
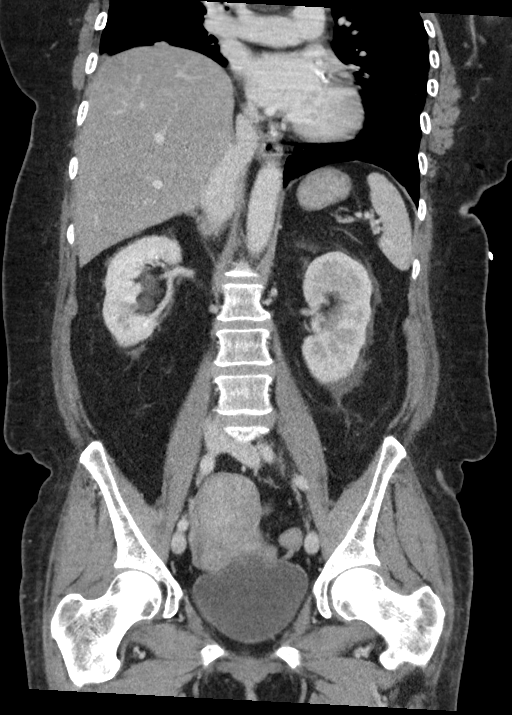

[16 of 46 positions shown; findings below may reference images not displayed]

FINDINGS: Lower chest: Mild basilar atelectasis or scarring.

Hepatobiliary: Fatty change of the liver. No focal hepatic lesion.
No calcified gallstones. No ductal dilatation.

Pancreas: Normal appearance of the pancreas.

Spleen: Normal

Adrenals/Urinary Tract: Adrenal glands are normal. Renal parenchyma
is normal on each side. One or 2 mm stone in the lower pole of the
left kidney. Fullness of both renal collecting systems, more on the
left than the right. 2 x 3 mm stone in the left ureter at the L4-5
level. No stone distal to that. Bladder appears normal.

Stomach/Bowel: No acute bowel pathology. Diverticulosis without
evidence of diverticulitis.

Vascular/Lymphatic: Aortic atherosclerosis. No aneurysm. IVC is
normal. No retroperitoneal adenopathy.

Reproductive: Multiple leiomyomas of the uterus, with variable
calcification. No adnexal mass.

Other: No free fluid or air.

Musculoskeletal: Lower lumbar degenerative disease with degenerative
spondylolisthesis at L5-S1.
IMPRESSION: 2 x 3 mm stone in the left ureter at the L4-5 level with mild left
hydroureteronephrosis.

Mild fullness of the right renal collecting system but no sign of
obstructing abnormality.

Leiomyomas of the uterus.

Fatty liver.

## 2019-09-20 ENCOUNTER — Other Ambulatory Visit: Payer: Self-pay

## 2019-09-20 ENCOUNTER — Encounter: Payer: Self-pay | Admitting: Ophthalmology

## 2019-09-21 ENCOUNTER — Other Ambulatory Visit: Payer: Self-pay | Admitting: Internal Medicine

## 2019-09-21 DIAGNOSIS — IMO0002 Reserved for concepts with insufficient information to code with codable children: Secondary | ICD-10-CM

## 2019-09-21 NOTE — Telephone Encounter (Signed)
Requested Prescriptions  Pending Prescriptions Disp Refills   losartan (COZAAR) 50 MG tablet [Pharmacy Med Name: LOSARTAN  50MG   TAB] 90 tablet 0    Sig: TAKE 1 TABLET BY MOUTH  DAILY     Cardiovascular:  Angiotensin Receptor Blockers Failed - 09/21/2019  1:28 PM      Failed - Cr in normal range and within 180 days    Creatinine, Ser  Date Value Ref Range Status  08/23/2019 1.08 (H) 0.44 - 1.00 mg/dL Final         Passed - K in normal range and within 180 days    Potassium  Date Value Ref Range Status  08/23/2019 4.8 3.5 - 5.1 mmol/L Final         Passed - Patient is not pregnant      Passed - Last BP in normal range    BP Readings from Last 1 Encounters:  08/23/19 118/76         Passed - Valid encounter within last 6 months    Recent Outpatient Visits          4 weeks ago Annual physical exam   [facility] Glean Hess, MD   4 months ago Dermatitis   [facility] Glean Hess, MD   7 months ago Essential hypertension   [facility] Glean Hess, MD   1 year ago Annual physical exam   [facility] Glean Hess, MD   1 year ago [facility] Glean Hess, MD      Future Appointments            In 5 months Army Melia Jesse Sans, MD [facility], [place]   In 11 months Army Melia Jesse Sans, MD [facility], [place]

## 2019-09-23 ENCOUNTER — Other Ambulatory Visit: Payer: Self-pay

## 2019-09-23 ENCOUNTER — Other Ambulatory Visit
Admission: RE | Admit: 2019-09-23 | Discharge: 2019-09-23 | Disposition: A | Discharge status: Home / Self Care | Payer: Medicare Other | Source: Ambulatory Visit | Attending: Ophthalmology | Admitting: Ophthalmology

## 2019-09-23 DIAGNOSIS — Z01812 Encounter for preprocedural laboratory examination: Secondary | ICD-10-CM | POA: Diagnosis not present

## 2019-09-23 DIAGNOSIS — Z20822 Contact with and (suspected) exposure to covid-19: Secondary | ICD-10-CM | POA: Diagnosis not present

## 2019-09-23 LAB — SARS CORONAVIRUS 2 (TAT 6-24 HRS): SARS Coronavirus 2: NEGATIVE

## 2019-09-23 NOTE — Anesthesia Preprocedure Evaluation (Addendum)
Anesthesia Evaluation  Patient identified by MRN, date of birth, ID band Patient awake    Reviewed: Allergy & Precautions, NPO status , Patient's Chart, lab work & pertinent test results  History of Anesthesia Complications Negative for: history of anesthetic complications  Airway Mallampati: III   Neck ROM: Full    Dental   Pulmonary shortness of breath (chronic dyspnea),    Pulmonary exam normal breath sounds clear to auscultation       Cardiovascular hypertension, + CAD  Normal cardiovascular exam Rhythm:Regular Rate:Normal  Palpitations    Neuro/Psych  Headaches, Vertigo     GI/Hepatic GERD  ,  Endo/Other  diabetes, Type 2  Renal/GU Renal disease (nephrolithiasis)     Musculoskeletal   Abdominal   Peds  Hematology negative hematology ROS (+)   Anesthesia Other Findings Cardiology note 09/15/19:   Plan   1. Chronic dyspnea, recently stable, echocardiogram unremarkable and revealed normal LV systolic function with an EF of >55% -Recommend continuing Singulair therapy.  2. Angina at rest, significant improvement in symptoms, stable, Lexiscan/Myoview was a conclusive negative scan -We will continue aspirin 81 mg once daily.  3. Heart palpitations, reasonably controlled -We will continue metoprolol therapy. -Recommend decreasing caffeine intake, avoiding the consumption of energy drinks and reducing sugar intake.  4. CAD, reasonably stable -We will continue management with aspirin, metoprolol and atorvastatin.  5. DM type II, reasonably controlled, most recent hemoglobin A1c = 7.4 -Management per endocrinology with Metformin, glimepiride and Lantus; recommend continuing current therapy.  6. Hypertension, reasonably controlled, patient is normotensive on today -We will continue management with losartan and metoprolol. -Recommend following DASH diet and monitoring blood pressure daily at home.  7.  Hyperlipidemia, reasonable control, recent lipid profile reveals a total cholesterol = 111, HDL = 44, triglyceride = 91, LDL = 49 -We will continue management with atorvastatin at this time. -Recommend obtaining yearly lipid profile via regularly scheduled labs through PCP.  8. Hyperkalemia, recent controlled, most recent potassium level = 4.8 (on 08/23/19) -Recommend following a low potassium diet. -Recommend the daily use of stool softeners to prevent constipation.  Return in about 6 months (around 03/17/2020). Discussed return precautions with patient for acute and chronic medical issues.    Reproductive/Obstetrics                            Anesthesia Physical Anesthesia Plan  ASA: III  Anesthesia Plan: MAC   Post-op Pain Management:    Induction: Intravenous  PONV Risk Score and Plan: 2 and Midazolam, TIVA and Treatment may vary due to age or medical condition  Airway Management Planned: Nasal Cannula  Additional Equipment:   Intra-op Plan:   Post-operative Plan:   Informed Consent: I have reviewed the patients History and Physical, chart, labs and discussed the procedure including the risks, benefits and alternatives for the proposed anesthesia with the patient or authorized representative who has indicated his/her understanding and acceptance.       Plan Discussed with: CRNA  Anesthesia Plan Comments:        Anesthesia Quick Evaluation

## 2019-09-23 NOTE — Discharge Instructions (Signed)

## 2019-09-27 ENCOUNTER — Other Ambulatory Visit: Payer: Self-pay

## 2019-09-27 ENCOUNTER — Ambulatory Visit: Payer: Medicare Other | Admitting: Anesthesiology

## 2019-09-27 ENCOUNTER — Ambulatory Visit
Admission: RE | Admit: 2019-09-27 | Discharge: 2019-09-27 | Disposition: A | Discharge status: Home / Self Care | Payer: Medicare Other | Attending: Ophthalmology | Admitting: Ophthalmology

## 2019-09-27 ENCOUNTER — Encounter
Admission: RE | Disposition: A | Discharge status: Home / Self Care | Payer: Self-pay | Source: Home / Self Care | Attending: Ophthalmology

## 2019-09-27 ENCOUNTER — Encounter: Payer: Self-pay | Admitting: Ophthalmology

## 2019-09-27 DIAGNOSIS — H2512 Age-related nuclear cataract, left eye: Secondary | ICD-10-CM | POA: Insufficient documentation

## 2019-09-27 DIAGNOSIS — E7800 Pure hypercholesterolemia, unspecified: Secondary | ICD-10-CM | POA: Insufficient documentation

## 2019-09-27 DIAGNOSIS — F419 Anxiety disorder, unspecified: Secondary | ICD-10-CM | POA: Diagnosis not present

## 2019-09-27 DIAGNOSIS — K219 Gastro-esophageal reflux disease without esophagitis: Secondary | ICD-10-CM | POA: Diagnosis not present

## 2019-09-27 DIAGNOSIS — I2510 Atherosclerotic heart disease of native coronary artery without angina pectoris: Secondary | ICD-10-CM | POA: Insufficient documentation

## 2019-09-27 DIAGNOSIS — I10 Essential (primary) hypertension: Secondary | ICD-10-CM | POA: Diagnosis not present

## 2019-09-27 DIAGNOSIS — E113212 Type 2 diabetes mellitus with mild nonproliferative diabetic retinopathy with macular edema, left eye: Secondary | ICD-10-CM | POA: Diagnosis not present

## 2019-09-27 DIAGNOSIS — E1136 Type 2 diabetes mellitus with diabetic cataract: Secondary | ICD-10-CM | POA: Insufficient documentation

## 2019-09-27 DIAGNOSIS — H401121 Primary open-angle glaucoma, left eye, mild stage: Secondary | ICD-10-CM | POA: Diagnosis not present

## 2019-09-27 DIAGNOSIS — I25119 Atherosclerotic heart disease of native coronary artery with unspecified angina pectoris: Secondary | ICD-10-CM | POA: Diagnosis not present

## 2019-09-27 DIAGNOSIS — H25812 Combined forms of age-related cataract, left eye: Secondary | ICD-10-CM | POA: Diagnosis not present

## 2019-09-27 DIAGNOSIS — Z794 Long term (current) use of insulin: Secondary | ICD-10-CM | POA: Diagnosis not present

## 2019-09-27 DIAGNOSIS — E1140 Type 2 diabetes mellitus with diabetic neuropathy, unspecified: Secondary | ICD-10-CM | POA: Insufficient documentation

## 2019-09-27 DIAGNOSIS — Z79899 Other long term (current) drug therapy: Secondary | ICD-10-CM | POA: Insufficient documentation

## 2019-09-27 DIAGNOSIS — E785 Hyperlipidemia, unspecified: Secondary | ICD-10-CM | POA: Insufficient documentation

## 2019-09-27 DIAGNOSIS — Z7982 Long term (current) use of aspirin: Secondary | ICD-10-CM | POA: Diagnosis not present

## 2019-09-27 HISTORY — DX: Zoster without complications: B02.9

## 2019-09-27 HISTORY — DX: Dizziness and giddiness: R42

## 2019-09-27 HISTORY — PX: CATARACT EXTRACTION W/PHACO: SHX586

## 2019-09-27 HISTORY — DX: Inflammatory liver disease, unspecified: K75.9

## 2019-09-27 LAB — GLUCOSE, CAPILLARY
Glucose-Capillary: 124 mg/dL — ABNORMAL HIGH (ref 70–99)
Glucose-Capillary: 169 mg/dL — ABNORMAL HIGH (ref 70–99)

## 2019-09-27 SURGERY — PHACOEMULSIFICATION, CATARACT, WITH IOL INSERTION
Anesthesia: Monitor Anesthesia Care | Site: Eye | Laterality: Left

## 2019-09-27 MED ORDER — ONDANSETRON HCL 4 MG/2ML IJ SOLN
4.0000 mg | Freq: Once | INTRAMUSCULAR | Status: AC | PRN
  Administered 2019-09-27: 4 mg via INTRAVENOUS

## 2019-09-27 MED ORDER — FENTANYL CITRATE (PF) 100 MCG/2ML IJ SOLN
INTRAMUSCULAR | Status: DC | PRN
  Administered 2019-09-27: 50 ug via INTRAVENOUS

## 2019-09-27 MED ORDER — EPINEPHRINE PF 1 MG/ML IJ SOLN
INTRAOCULAR | Status: DC | PRN
  Administered 2019-09-27: 63 mL via OPHTHALMIC

## 2019-09-27 MED ORDER — ACETAMINOPHEN 325 MG PO TABS: 650.0000 mg | ORAL_TABLET | Freq: Once | ORAL | Status: DC | PRN

## 2019-09-27 MED ORDER — TRIAMCINOLONE ACETONIDE 40 MG/ML IJ SUSP
INTRAMUSCULAR | Status: DC | PRN
  Administered 2019-09-27: 40 mg

## 2019-09-27 MED ORDER — SODIUM HYALURONATE 10 MG/ML IO SOLN
INTRAOCULAR | Status: DC | PRN
  Administered 2019-09-27: 0.55 mL via INTRAOCULAR

## 2019-09-27 MED ORDER — ACETAMINOPHEN 160 MG/5ML PO SOLN: 325.0000 mg | ORAL | Status: DC | PRN

## 2019-09-27 MED ORDER — MIDAZOLAM HCL 2 MG/2ML IJ SOLN
INTRAMUSCULAR | Status: DC | PRN
  Administered 2019-09-27: 1 mg via INTRAVENOUS

## 2019-09-27 MED ORDER — SODIUM HYALURONATE 23 MG/ML IO SOLN
INTRAOCULAR | Status: DC | PRN
  Administered 2019-09-27: 0.6 mL via INTRAOCULAR

## 2019-09-27 MED ORDER — LIDOCAINE HCL (PF) 2 % IJ SOLN
INTRAOCULAR | Status: DC | PRN
  Administered 2019-09-27: 1 mL via INTRAOCULAR

## 2019-09-27 MED ORDER — TETRACAINE HCL 0.5 % OP SOLN
1.0000 [drp] | OPHTHALMIC | Status: DC | PRN
  Administered 2019-09-27 (×3): 1 [drp] via OPHTHALMIC

## 2019-09-27 MED ORDER — LACTATED RINGERS IV SOLN: INTRAVENOUS | Status: DC

## 2019-09-27 MED ORDER — ARMC OPHTHALMIC DILATING DROPS
1.0000 "application " | OPHTHALMIC | Status: DC | PRN
  Administered 2019-09-27 (×3): 1 via OPHTHALMIC

## 2019-09-27 MED ORDER — MOXIFLOXACIN HCL 0.5 % OP SOLN
OPHTHALMIC | Status: DC | PRN
  Administered 2019-09-27: 0.2 mL via OPHTHALMIC

## 2019-09-27 SURGICAL SUPPLY — 23 items
CANNULA ANT/CHMB 27G (MISCELLANEOUS) ×2 IMPLANT
CANNULA ANT/CHMB 27GA (MISCELLANEOUS) ×6 IMPLANT
DEVICE INJECT ISTENT W (Stent) IMPLANT
DISSECTOR HYDRO NUCLEUS 50X22 (MISCELLANEOUS) ×3 IMPLANT
GLOVE SURG LX 7.5 STRW (GLOVE) ×4
GLOVE SURG LX STRL 7.5 STRW (GLOVE) ×1 IMPLANT
GLOVE SURG SYN 8.5  E (GLOVE) ×2
GLOVE SURG SYN 8.5 E (GLOVE) ×1 IMPLANT
GLOVE SURG SYN 8.5 PF PI (GLOVE) ×1 IMPLANT
GOWN STRL REUS W/ TWL LRG LVL3 (GOWN DISPOSABLE) ×2 IMPLANT
GOWN STRL REUS W/TWL LRG LVL3 (GOWN DISPOSABLE) ×6
ICLIP (OPHTHALMIC RELATED) ×2 IMPLANT
INJECT ISTENT W (Stent) ×3 IMPLANT
LENS IOL DIOP 22.5 (Intraocular Lens) ×3 IMPLANT
LENS IOL TECNIS MONO 22.5 (Intraocular Lens) IMPLANT
MARKER SKIN DUAL TIP RULER LAB (MISCELLANEOUS) ×3 IMPLANT
PACK DR. KING ARMS (PACKS) ×3 IMPLANT
PACK EYE AFTER SURG (MISCELLANEOUS) ×3 IMPLANT
PACK OPTHALMIC (MISCELLANEOUS) ×3 IMPLANT
SYR 3ML LL SCALE MARK (SYRINGE) ×3 IMPLANT
SYR TB 1ML LUER SLIP (SYRINGE) ×3 IMPLANT
WATER STERILE IRR 250ML POUR (IV SOLUTION) ×3 IMPLANT
WIPE NON LINTING 3.25X3.25 (MISCELLANEOUS) ×3 IMPLANT

## 2019-09-27 NOTE — Transfer of Care (Signed)
Immediate Anesthesia Transfer of Care Note  Patient: Elizabeth Klein  Procedure(s) Performed: CATARACT EXTRACTION PHACO AND INTRAOCULAR LENS PLACEMENT (IOC) LEFT DIABETIC ISTENT INJ INTRAVITREAL KENALOG INJ (Left Eye)  Patient Location: PACU  Anesthesia Type: MAC  Level of Consciousness: awake, alert  and patient cooperative  Airway and Oxygen Therapy: Patient Spontanous Breathing and Patient connected to supplemental oxygen  Post-op Assessment: Post-op Vital signs reviewed, Patient's Cardiovascular Status Stable, Respiratory Function Stable, Patent Airway and No signs of Nausea or vomiting  Post-op Vital Signs: Reviewed and stable  Complications: No complications documented.

## 2019-09-27 NOTE — Anesthesia Postprocedure Evaluation (Signed)
Anesthesia Post Note  Patient: Elizabeth Klein  Procedure(s) Performed: CATARACT EXTRACTION PHACO AND INTRAOCULAR LENS PLACEMENT (IOC) LEFT DIABETIC ISTENT INJ INTRAVITREAL KENALOG INJ (Left Eye)     Patient location during evaluation: PACU Anesthesia Type: MAC Level of consciousness: awake and alert, oriented and patient cooperative Pain management: pain level controlled Vital Signs Assessment: post-procedure vital signs reviewed and stable Respiratory status: spontaneous breathing, nonlabored ventilation and respiratory function stable Cardiovascular status: blood pressure returned to baseline and stable Postop Assessment: adequate PO intake Anesthetic complications: no   No complications documented.  Darrin Nipper

## 2019-09-27 NOTE — Anesthesia Procedure Notes (Signed)
Procedure Name: MAC Date/Time: 09/27/2019 7:43 AM Performed by: Silvana Newness, CRNA Pre-anesthesia Checklist: Patient identified, Emergency Drugs available, Suction available, Patient being monitored and Timeout performed Patient Re-evaluated:Patient Re-evaluated prior to induction Oxygen Delivery Method: Nasal cannula Placement Confirmation: positive ETCO2

## 2019-09-27 NOTE — Op Note (Signed)
OPERATIVE NOTE  Elizabeth Klein [id number] 09/27/2019  PREOPERATIVE DIAGNOSIS:   1.  Mild  PRIMARY open angle glaucoma, left eye. I43.3295  2.  Nuclear sclerotic cataract left eye.  H25.12 3.  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   PROCEDURE:   1.  Placement of trabecular bypass stent (istent). CPT 0191T   2,  Placement of additional stent, LEFT EYE  CPT 0376T 3.  Phacoemusification with posterior chamber intraocular lens placement of the left eye  CPT [phone] 4.  CPT [id number] Intravitreal injection of steroid, left eye.   LENS: Implant Name Type Inv. Item Serial No. Manufacturer Lot No. LRB No. Used Action  LENS IOL DIOP 22.5 - [id number] Intraocular Lens LENS IOL DIOP 22.5 [id number] [facility] [employer]  Left 1 Implanted  Marko Stai - [id number] Stent Marko Stai [id number] [employer] [id number] Left 1 Implanted      Procedure(s) with comments: CATARACT EXTRACTION PHACO AND INTRAOCULAR LENS PLACEMENT (IOC) LEFT DIABETIC ISTENT INJ INTRAVITREAL KENALOG INJ (Left) - 2.83 0:28.7  DCB00 +22.5    ULTRASOUND TIME: 0 minutes 28 seconds.  CDE 2.83   SURGEON:  Benay Pillow, MD, MPH  ANESTHESIOLOGIST: Anesthesiologist: Darrin Nipper, MD CRNA: Silvana Newness, CRNA   ANESTHESIA:  MAC and intracameral preservative-free intracameral lidocaine 4%.  ESTIMATED BLOOD LOSS: less than 1 mL.   COMPLICATIONS:  None.   DESCRIPTION OF PROCEDURE:  The patient was identified in the holding room and transported to the operating room.  The patient was placed in the supine position under the operating microscope.  The left eye was prepped and draped in the usual sterile ophthalmic fashion.   A 1.0 millimeter clear-corneal paracentesis was made at the 4:30 position. 0.5 ml of preservative-free 1% lidocaine with epinephrine was injected into the anterior chamber.  The anterior chamber  was filled with Healon 5 viscoelastic.  A 2.4 millimeter keratome was used to make a near-clear corneal incision at the 2:00 position.   Attention was turned to the istent.  The patients head was turned to the left and the microscope was tilted to 035 degrees.  Ocular instruments/[employer]aukos OAL/H2 gonioprism was used with IPC05 (iclip) coupled with Healon 5 on the cornea was used to visualize the trabecular meshwork. The istent was opened and introduced into the eye.  The meshwork was engaged with the tip of the iStent injector and the stent was deployed into Schlemm's canal at 10:30.  The second stent was deployed at 8:00.  The stents were well seated and in good position.  Next, attention was turned to the phacoemulsification A curvilinear capsulorrhexis was made with a cystotome and capsulorrhexis forceps.  Balanced salt solution was used to hydrodissect and hydrodelineate the nucleus.   Phacoemulsification was then used in stop and chop fashion to remove the lens nucleus and epinucleus.  The remaining cortex was then removed using the irrigation and aspiration handpiece. Healon was then placed into the capsular bag to distend it for lens placement.  A lens was then injected into the capsular bag.  The remaining viscoelastic was aspirated.   Wounds were hydrated with balanced salt solution.  The anterior chamber was inflated to a physiologic pressure with balanced salt solution.   Calipers were used to mark 3.5 mm posterior to the limbus in the inferotemporal quadrant.  0.1 mL of Kenalog 40 mg/mL was injected into the vitreous cavity.   Intracameral vigamox 0.1 mL  undiluted was injected into the eye and a drop placed onto the ocular surface.  No wound leaks were noted.  Protective glasses were placed on the patient.  The patient was taken to the recovery room in stable condition without complications of anesthesia or surgery   Benay Pillow 09/27/2019, 8:09 AM

## 2019-09-27 NOTE — H&P (Signed)

## 2019-09-28 ENCOUNTER — Encounter: Payer: Self-pay | Admitting: Ophthalmology

## 2019-10-11 ENCOUNTER — Ambulatory Visit: Payer: Medicare Other

## 2019-10-13 ENCOUNTER — Ambulatory Visit (INDEPENDENT_AMBULATORY_CARE_PROVIDER_SITE_OTHER): Payer: Medicare Other

## 2019-10-13 DIAGNOSIS — Z0000 Encounter for general adult medical examination without abnormal findings: Secondary | ICD-10-CM

## 2019-10-13 NOTE — Patient Instructions (Signed)
Elizabeth Klein , Thank you for taking time to come for your Medicare Wellness Visit. I appreciate your ongoing commitment to your health goals. Please review the following plan we discussed and let me know if I can assist you in the future.   Screening recommendations/referrals: Colonoscopy: done 09/26/15. Repeat in 2027 Mammogram: done 10/14/18. Scheduled for 10/25/19 Bone Density: done 10/14/18 Recommended yearly ophthalmology/optometry visit for glaucoma screening and checkup Recommended yearly dental visit for hygiene and checkup  Vaccinations: Influenza vaccine: done 12/02/18 Pneumococcal vaccine: done 08/20/18 Tdap vaccine: due Shingles vaccine: 1st dose done 05/20/17. Due for second dose.    Covid-19:done 04/21/19 & 05/12/19  Advanced directives: Advance directive discussed with you today. I have provided a copy for you to complete at home and have notarized. Once this is complete please bring a copy in to our office so we can scan it into your chart.  Conditions/risks identified: Recommend increasing physical activity as tolerated  Next appointment: Follow up in one year for your annual wellness visit    Preventive Care 65 Years and Older, Female Preventive care refers to lifestyle choices and visits with your health care provider that can promote health and wellness. What does preventive care include?  A yearly physical exam. This is also called an annual well check.  Dental exams once or twice a year.  Routine eye exams. Ask your health care provider how often you should have your eyes checked.  Personal lifestyle choices, including:  Daily care of your teeth and gums.  Regular physical activity.  Eating a healthy diet.  Avoiding tobacco and drug use.  Limiting alcohol use.  Practicing safe sex.  Taking low-dose aspirin every day.  Taking vitamin and mineral supplements as recommended by your health care provider. What happens during an annual well check? The  services and screenings done by your health care provider during your annual well check will depend on your age, overall health, lifestyle risk factors, and family history of disease. Counseling  Your health care provider may ask you questions about your:  Alcohol use.  Tobacco use.  Drug use.  Emotional well-being.  Home and relationship well-being.  Sexual activity.  Eating habits.  History of falls.  Memory and ability to understand (cognition).  Work and work Statistician.  Reproductive health. Screening  You may have the following tests or measurements:  Height, weight, and BMI.  Blood pressure.  Lipid and cholesterol levels. These may be checked every 5 years, or more frequently if you are over 93 years old.  Skin check.  Lung cancer screening. You may have this screening every year starting at age 74 if you have a 30-pack-year history of smoking and currently smoke or have quit within the past 15 years.  Fecal occult blood test (FOBT) of the stool. You may have this test every year starting at age 61.  Flexible sigmoidoscopy or colonoscopy. You may have a sigmoidoscopy every 5 years or a colonoscopy every 10 years starting at age 68.  Hepatitis C blood test.  Hepatitis B blood test.  Sexually transmitted disease (STD) testing.  Diabetes screening. This is done by checking your blood sugar (glucose) after you have not eaten for a while (fasting). You may have this done every 1-3 years.  Bone density scan. This is done to screen for osteoporosis. You may have this done starting at age 56.  Mammogram. This may be done every 1-2 years. Talk to your health care provider about how often you should have  regular mammograms. Talk with your health care provider about your test results, treatment options, and if necessary, the need for more tests. Vaccines  Your health care provider may recommend certain vaccines, such as:  Influenza vaccine. This is recommended  every year.  Tetanus, diphtheria, and acellular pertussis (Tdap, Td) vaccine. You may need a Td booster every 10 years.  Zoster vaccine. You may need this after age 89.  Pneumococcal 13-valent conjugate (PCV13) vaccine. One dose is recommended after age 36.  Pneumococcal polysaccharide (PPSV23) vaccine. One dose is recommended after age 74. Talk to your health care provider about which screenings and vaccines you need and how often you need them. This information is not intended to replace advice given to you by your health care provider. Make sure you discuss any questions you have with your health care provider. Document Released: 03/10/2015 Document Revised: 11/01/2015 Document Reviewed: 12/13/2014 Elsevier Interactive Patient Education  2017 [place] Prevention in the Home Falls can cause injuries. They can happen to people of all ages. There are many things you can do to make your home safe and to help prevent falls. What can I do on the outside of my home?  Regularly fix the edges of walkways and driveways and fix any cracks.  Remove anything that might make you trip as you walk through a door, such as a raised step or threshold.  Trim any bushes or trees on the path to your home.  Use bright outdoor lighting.  Clear any walking paths of anything that might make someone trip, such as rocks or tools.  Regularly check to see if handrails are loose or broken. Make sure that both sides of any steps have handrails.  Any raised decks and porches should have guardrails on the edges.  Have any leaves, snow, or ice cleared regularly.  Use sand or salt on walking paths during winter.  Clean up any spills in your garage right away. This includes oil or grease spills. What can I do in the bathroom?  Use night lights.  Install grab bars by the toilet and in the tub and shower. Do not use towel bars as grab bars.  Use non-skid mats or decals in the tub or shower.  If  you need to sit down in the shower, use a plastic, non-slip stool.  Keep the floor dry. Clean up any water that spills on the floor as soon as it happens.  Remove soap buildup in the tub or shower regularly.  Attach bath mats securely with double-sided non-slip rug tape.  Do not have throw rugs and other things on the floor that can make you trip. What can I do in the bedroom?  Use night lights.  Make sure that you have a light by your bed that is easy to reach.  Do not use any sheets or blankets that are too big for your bed. They should not hang down onto the floor.  Have a firm chair that has side arms. You can use this for support while you get dressed.  Do not have throw rugs and other things on the floor that can make you trip. What can I do in the kitchen?  Clean up any spills right away.  Avoid walking on wet floors.  Keep items that you use a lot in easy-to-reach places.  If you need to reach something above you, use a strong step stool that has a grab bar.  Keep electrical cords out of the  way.  Do not use floor polish or wax that makes floors slippery. If you must use wax, use non-skid floor wax.  Do not have throw rugs and other things on the floor that can make you trip. What can I do with my stairs?  Do not leave any items on the stairs.  Make sure that there are handrails on both sides of the stairs and use them. Fix handrails that are broken or loose. Make sure that handrails are as long as the stairways.  Check any carpeting to make sure that it is firmly attached to the stairs. Fix any carpet that is loose or worn.  Avoid having throw rugs at the top or bottom of the stairs. If you do have throw rugs, attach them to the floor with carpet tape.  Make sure that you have a light switch at the top of the stairs and the bottom of the stairs. If you do not have them, ask someone to add them for you. What else can I do to help prevent falls?  Wear shoes  that:  Do not have high heels.  Have rubber bottoms.  Are comfortable and fit you well.  Are closed at the toe. Do not wear sandals.  If you use a stepladder:  Make sure that it is fully opened. Do not climb a closed stepladder.  Make sure that both sides of the stepladder are locked into place.  Ask someone to hold it for you, if possible.  Clearly mark and make sure that you can see:  Any grab bars or handrails.  First and last steps.  Where the edge of each step is.  Use tools that help you move around (mobility aids) if they are needed. These include:  Canes.  Walkers.  Scooters.  Crutches.  Turn on the lights when you go into a dark area. Replace any light bulbs as soon as they burn out.  Set up your furniture so you have a clear path. Avoid moving your furniture around.  If any of your floors are uneven, fix them.  If there are any pets around you, be aware of where they are.  Review your medicines with your doctor. Some medicines can make you feel dizzy. This can increase your chance of falling. Ask your doctor what other things that you can do to help prevent falls. This information is not intended to replace advice given to you by your health care provider. Make sure you discuss any questions you have with your health care provider. Document Released: 12/08/2008 Document Revised: 07/20/2015 Document Reviewed: 03/18/2014 Elsevier Interactive Patient Education  2017 [employer].

## 2019-10-13 NOTE — Progress Notes (Signed)
Subjective:   Elizabeth Klein is a 69 y.o. female who presents for Medicare Annual (Subsequent) preventive examination.  Virtual Visit via Telephone Note  I connected with  Elizabeth Klein on 10/13/19 at 10:00 AM EDT by telephone and verified that I am speaking with the correct person using two identifiers.  Medicare Annual Wellness visit completed telephonically due to Covid-19 pandemic.   Location: Patient: home Provider: [facility]   I discussed the limitations, risks, security and privacy concerns of performing an evaluation and management service by telephone and the availability of in person appointments. The patient expressed understanding and agreed to proceed.  Unable to perform video visit due to video visit attempted and failed and/or patient does not have video capability.   Some vital signs may be absent or patient reported.   Elizabeth Marker, LPN    Review of Systems     Cardiac Risk Factors include: advanced age (>29men, >76 women);diabetes mellitus;dyslipidemia;hypertension     Objective:    There were no vitals filed for this visit. There is no height or weight on file to calculate BMI.  Advanced Directives 10/13/2019 09/27/2019 08/06/2019 10/07/2018 10/10/2017 09/06/2017 06/20/2017  Does Patient Have a Medical Advance Directive? No No No No No No No  Would patient like information on creating a medical advance directive? Yes (MAU/Ambulatory/Procedural Areas - Information given) No - Patient declined - Yes (MAU/Ambulatory/Procedural Areas - Information given) No - Patient declined - Yes (MAU/Ambulatory/Procedural Areas - Information given)    Current Medications (verified) Outpatient Encounter Medications as of 10/13/2019  Medication Sig  . acetaminophen (TYLENOL) 500 MG tablet Take 1,000 mg by mouth every 8 (eight) hours as needed.  Marland Kitchen aspirin EC 81 MG tablet Take 1 tablet (81 mg total) by mouth daily.  Marland Kitchen atorvastatin (LIPITOR) 80 MG tablet TAKE 1 TABLET BY MOUTH   DAILY  . BD INSULIN SYRINGE U/F 30G X 1/2" 0.5 ML MISC USE WITH LANTUS INJECTIONS  FOR DM2  . glimepiride (AMARYL) 4 MG tablet Take 1 tablet (4 mg total) by mouth daily with breakfast. (Patient taking differently: Take 2 mg by mouth daily with breakfast. )  . glucose blood (ACCU-CHEK SMARTVIEW) test strip USE 3 TIMES DAILY  . Halobetasol Prop-Tazarotene (DUOBRII) 0.01-0.045 % LOTN Apply 1 application topically 2 (two) times daily.  . insulin glargine (LANTUS) 100 UNIT/ML injection Inject 0.4 mLs (40 Units total) into the skin every morning. Pt taking QAM (Patient taking differently: Inject 26 Units into the skin every morning. Pt taking QAM)  . latanoprost (XALATAN) 0.005 % ophthalmic solution SMARTSIG:1 Drop(s) In Eye(s) Every Evening  . losartan (COZAAR) 50 MG tablet TAKE 1 TABLET BY MOUTH  DAILY  . metFORMIN (GLUCOPHAGE) 1000 MG tablet TAKE 1 TABLET BY MOUTH  TWICE DAILY WITH MEALS  . metoprolol tartrate (LOPRESSOR) 50 MG tablet TAKE 1 TABLET BY MOUTH  TWICE DAILY  . montelukast (SINGULAIR) 10 MG tablet TAKE 1 TABLET BY MOUTH  DAILY  . omeprazole (PRILOSEC) 20 MG capsule TAKE 1 CAPSULE BY MOUTH  DAILY  . ONETOUCH DELICA PLUS LANCETS MISC 1 each by Does not apply route 2 (two) times daily.  . Semaglutide, 1 MG/DOSE, (OZEMPIC, 1 MG/DOSE,) 4 MG/3ML SOPN Inject into the skin. Inject 1 mg into the skin every 7 days  . Syringe, Disposable, (B-D 30CC SYRINGE) 30 ML MISC Use with Lantus Injections for DM2 ICD 10 E11.9  . traZODone (DESYREL) 50 MG tablet TAKE 1 TABLET BY MOUTH AT  BEDTIME  . [DISCONTINUED] gabapentin (NEURONTIN)  300 MG capsule Take 300 mg by mouth 3 (three) times daily. (Patient not taking: Reported on 08/23/2019)  . [DISCONTINUED] glucose blood test strip Use to check BS with Accucheck Meter up to 3 times daily for DM2 E11.9  . [DISCONTINUED] Latanoprost 0.005 % EMUL Apply to eye.  . [DISCONTINUED] Semaglutide,0.25 or 0.5MG /DOS, (OZEMPIC, 0.25 OR 0.5 MG/DOSE,) 2 MG/1.5ML SOPN Inject 0.5  mg into the skin once a week.   No facility-administered encounter medications on file as of 10/13/2019.    Allergies (verified) Prednisone   History: Past Medical History:  Diagnosis Date  . Acid reflux   . Chest pain   . Diabetes mellitus without complication ([place])   . Hepatitis    age 33  . High cholesterol   . Hypertension   . Kidney stones   . Migraines   . Psoriasis   . Rapid heart rate   . Shingles    spring 2021  . Vertigo    1x - approx 12 yrs ago  . Weight loss    Past Surgical History:  Procedure Laterality Date  . BREAST BIOPSY Right    neg  . CATARACT EXTRACTION W/PHACO Left 09/27/2019   Procedure: CATARACT EXTRACTION PHACO AND INTRAOCULAR LENS PLACEMENT (IOC) LEFT DIABETIC ISTENT INJ INTRAVITREAL KENALOG INJ;  Surgeon: Eulogio Bear, MD;  Location: [facility];  Service: Ophthalmology;  Laterality: Left;  2.83 0:28.7  . CESAREAN SECTION     Family History  Problem Relation Age of Onset  . Breast cancer Neg Hx   . Diabetes Brother   . Heart attack Brother   . Diabetes Brother   . Heart attack Brother   . Diabetes Brother    Social History   Socioeconomic History  . Marital status: Married    Spouse name: Not on file  . Number of children: 1  . Years of education: Not on file  . Highest education level: Not on file  Occupational History  . Not on file  Tobacco Use  . Smoking status: Never Smoker  . Smokeless tobacco: Never Used  Vaping Use  . Vaping Use: Never used  Substance and Sexual Activity  . Alcohol use: No  . Drug use: No  . Sexual activity: Not on file  Other Topics Concern  . Not on file  Social History Narrative          Social Determinants of Health   Financial Resource Strain: Low Risk   . Difficulty of Paying Living Expenses: Not hard at all  Food Insecurity: No Food Insecurity  . Worried About Charity fundraiser in the Last Year: Never true  . Ran Out of Food in the Last Year: Never true  Transportation  Needs: No Transportation Needs  . Lack of Transportation (Medical): No  . Lack of Transportation (Non-Medical): No  Physical Activity: Inactive  . Days of Exercise per Week: 0 days  . Minutes of Exercise per Session: 0 min  Stress: Stress Concern Present  . Feeling of Stress : To some extent  Social Connections: Moderately Isolated  . Frequency of Communication with Friends and Family: More than three times a week  . Frequency of Social Gatherings with Friends and Family: Twice a week  . Attends Religious Services: Never  . Active Member of Clubs or Organizations: No  . Attends Archivist Meetings: Never  . Marital Status: Married    Tobacco Counseling Counseling given: Not Answered   Clinical Intake:  Pre-visit preparation  completed: Yes  Pain : No/denies pain     Nutritional Risks: None Diabetes: Yes CBG done?: No Did pt. bring in CBG monitor from home?: No  How often do you need to have someone help you when you read instructions, pamphlets, or other written materials from your doctor or pharmacy?: 1 - Never  Nutrition Risk Assessment:  Has the patient had any N/V/D within the last 2 months?  No  Does the patient have any non-healing wounds?  No  Has the patient had any unintentional weight loss or weight gain?  No   Diabetes:  Is the patient diabetic?  Yes  If diabetic, was a CBG obtained today?  No  Did the patient bring in their glucometer from home?  No  How often do you monitor your CBG's? daily.   Financial Strains and Diabetes Management:  Are you having any financial strains with the device, your supplies or your medication? No .  Does the patient want to be seen by Chronic Care Management for management of their diabetes?  No  Would the patient like to be referred to a Nutritionist or for Diabetic Management?  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   Diabetic Foot Exam: Completed 08/23/19.    Interpreter Needed?: No  Information entered by :: Elizabeth Marker LPN   Activities of Daily Living In your present state of health, do you have any difficulty performing the following activities: 10/13/2019 09/27/2019  Hearing? Y N  Comment declines hearing aids -  Vision? Y N  Difficulty concentrating or making decisions? N N  Walking or climbing stairs? N N  Dressing or bathing? N N  Doing errands, shopping? N -  Preparing Food and eating ? N -  Using the Toilet? N -  In the past six months, have you accidently leaked urine? Y -  Comment wears liners for protection -  Do you have problems with loss of bowel control? N -  Managing your Medications? N -  Managing your Finances? N -  Housekeeping or managing your Housekeeping? N -  Some recent data might be hidden    Patient Care Team: Glean Hess, MD as PCP - General (Internal Medicine) Lonia Farber, MD as Consulting Physician (Endocrinology) Laneta Simmers as Physician Assistant (Urology) Eulogio Bear, MD as Consulting Physician (Ophthalmology) Jannet Mantis, MD (Dermatology)  Indicate any recent Medical Services you may have received from other than [facility] providers in the past year (date may be approximate).     Assessment:   This is a routine wellness examination for Jaclene.  Hearing/Vision screen  Hearing Screening   125Hz  250Hz  500Hz  1000Hz  2000Hz  3000Hz  4000Hz  6000Hz  8000Hz   Right ear:           Left ear:           Comments: Pt states she occasionally has hearing difficulty; wants to discuss with Dr. Army Melia for hearing evaluation.   Vision Screening Comments: [facility] Dr. Edison Pace  Dietary issues and exercise activities discussed: Current Exercise Habits: The patient does not participate in regular exercise at present, Exercise limited by: None identified  Goals    . Increase physical activity     Recommend increasing physical activity to  150 minutes per week      Depression Screen PHQ 2/9 Scores 10/13/2019 08/23/2019 05/05/2019 10/07/2018 08/20/2018 09/09/2017 06/20/2017  PHQ - 2 Score 0 0 0 2 0  1 1  PHQ- 9 Score - 4 0 3 - 7 -    Fall Risk Fall Risk  10/13/2019 08/23/2019 10/07/2018 08/20/2018 06/20/2017  Falls in the past year? 1 1 0 0 Yes  Number falls in past yr: 0 0 0 0 1  Injury with Fall? 0 0 0 0 No  Comment - - - - briefly disoriented  Risk for fall due to : History of fall(s) History of fall(s) - History of fall(s) History of fall(s)  Follow up Falls prevention discussed Falls evaluation completed Falls prevention discussed Falls evaluation completed Education provided    Any stairs in or around the home? Yes  If so, are there any without handrails? No  Home free of loose throw rugs in walkways, pet beds, electrical cords, etc? Yes  Adequate lighting in your home to reduce risk of falls? Yes   ASSISTIVE DEVICES UTILIZED TO PREVENT FALLS:  Life alert? No  Use of a cane, walker or w/c? No  Grab bars in the bathroom? Yes  Shower chair or bench in shower? Yes  Elevated toilet seat or a handicapped toilet? Yes   TIMED UP AND GO:  Was the test performed? No . Telephonic visit.   Cognitive Function:     6CIT Screen 10/13/2019 10/07/2018  What Year? 0 points 0 points  What month? 0 points 0 points  What time? 0 points 0 points  Count back from 20 0 points 0 points  Months in reverse 0 points 0 points  Repeat phrase 0 points 0 points  Total Score 0 0    Immunizations Immunization History  Administered Date(s) Administered  . Influenza, High Dose Seasonal PF 01/07/2018, 12/02/2018  . Influenza-Unspecified 10/26/2016  . PFIZER SARS-COV-2 Vaccination 04/21/2019, 05/12/2019  . Pneumococcal Conjugate-13 03/19/2017  . Pneumococcal Polysaccharide-23 08/20/2018  . Zoster Recombinat (Shingrix) 04/30/2017    TDAP status: Due, Education has been provided regarding the importance of this vaccine. Advised may receive  this vaccine at local pharmacy or Health Dept. Aware to provide a copy of the vaccination record if obtained from local pharmacy or Health Dept. Verbalized acceptance and understanding.   Flu Vaccine status: Up to date   Pneumococcal vaccine status: Up to date   Covid-19 vaccine status: Completed vaccines  Qualifies for Shingles Vaccine? Yes   Zostavax completed No   Shingrix Completed?: Yes Due for second dose.   Screening Tests Health Maintenance  Topic Date Due  . INFLUENZA VACCINE  09/26/2019  . TETANUS/TDAP  08/22/2020 (Originally 03/14/1969)  . OPHTHALMOLOGY EXAM  01/05/2020  . HEMOGLOBIN A1C  02/22/2020  . FOOT EXAM  08/22/2020  . MAMMOGRAM  10/13/2020  . COLONOSCOPY  09/25/2025  . DEXA SCAN  Completed  . COVID-19 Vaccine  Completed  . Hepatitis C Screening  Completed  . PNA vac Low Risk Adult  Completed    Health Maintenance  Health Maintenance Due  Topic Date Due  . INFLUENZA VACCINE  09/26/2019    Colorectal cancer screening: Completed 09/26/15. Repeat every 10 years   Mammogram status: Completed 10/14/18. Repeat every year   Bone Density status: Completed 10/14/18. Results reflect: Bone density results: OSTEOPENIA. Repeat every 2 years.  Lung Cancer Screening: (Low Dose CT Chest recommended if Age 40-80 years, 30 pack-year currently smoking OR have quit w/in 15years.) does not qualify.   Additional Screening:  Hepatitis C Screening: does qualify; Completed 06/02/17  Vision Screening: Recommended annual ophthalmology exams for early detection of glaucoma and other disorders of the eye.  Is the patient up to date with their annual eye exam?  Yes  Who is the provider or what is the name of the office in which the patient attends annual eye exams? Dr. Edison Pace  Dental Screening: Recommended annual dental exams for proper oral hygiene  Community Resource Referral / Chronic Care Management: CRR required this visit?  No   CCM required this visit?  No      Plan:      I have personally reviewed and noted the following in the patient's chart:   . Medical and social history . Use of alcohol, tobacco or illicit drugs  . Current medications and supplements . Functional ability and status . Nutritional status . Physical activity . Advanced directives . List of other physicians . Hospitalizations, surgeries, and ER visits in previous 12 months . Vitals . Screenings to include cognitive, depression, and falls . Referrals and appointments  In addition, I have reviewed and discussed with patient certain preventive protocols, quality metrics, and best practice recommendations. A written personalized care plan for preventive services as well as general preventive health recommendations were provided to patient.   Due to this being a telephonic visit, the after visit summary with patients personalized plan was offered to patient via mail or my-chart. Patient declined at this time.  Elizabeth Marker, LPN   06/18/9530   Nurse Notes: pt c/o hearing difficulty at times and wants to discuss with Dr. Army Melia prior to referral to ENT or hearing eval. Per Chassidy okay to schedule for Monday 10/18/19 @ 11:20.

## 2019-10-15 ENCOUNTER — Other Ambulatory Visit: Payer: Self-pay | Admitting: Internal Medicine

## 2019-10-15 DIAGNOSIS — E785 Hyperlipidemia, unspecified: Secondary | ICD-10-CM

## 2019-10-15 DIAGNOSIS — G4700 Insomnia, unspecified: Secondary | ICD-10-CM

## 2019-10-15 NOTE — Telephone Encounter (Signed)
Requested medication (s) are due for refill today: Yes  Requested medication (s) are on the active medication list: Yes  Last refill:  Atorvastatin 11/04/18 Trazodone  11/09/18   Future visit scheduled: Yes  Notes to clinic:  Prescriptions expire next month.    Requested Prescriptions  Pending Prescriptions Disp Refills   atorvastatin (LIPITOR) 80 MG tablet [Pharmacy Med Name: Atorvastatin Calcium 80 MG Oral Tablet] 90 tablet 3    Sig: TAKE 1 TABLET BY MOUTH  DAILY      Cardiovascular:  Antilipid - Statins Failed - 10/15/2019  5:02 AM      Failed - Total Cholesterol in normal range and within 360 days    Cholesterol, Total  Date Value Ref Range Status  08/20/2018 115 100 - 199 mg/dL Final          Failed - LDL in normal range and within 360 days    LDL Calculated  Date Value Ref Range Status  08/20/2018 38 0 - 99 mg/dL Final          Failed - HDL in normal range and within 360 days    HDL  Date Value Ref Range Status  08/20/2018 41 >39 mg/dL Final          Failed - Triglycerides in normal range and within 360 days    Triglycerides  Date Value Ref Range Status  08/20/2018 182 (H) 0 - 149 mg/dL Final          Passed - Patient is not pregnant      Passed - Valid encounter within last 12 months    Recent Outpatient Visits           1 month ago Annual physical exam   [facility] Glean Hess, MD   5 months ago Dermatitis   [facility] Glean Hess, MD   7 months ago Essential hypertension   [facility] Glean Hess, MD   1 year ago Annual physical exam   [facility] Glean Hess, MD   1 year ago [facility] Glean Hess, MD       Future Appointments             In 3 days Glean Hess, MD [facility], [place]   In 4 months Army Melia Jesse Sans, MD [facility], [place]   In 10 months Army Melia Jesse Sans, MD [facility], [place]               traZODone ([place]) 50 MG tablet [Pharmacy Med Name: traZODone HCl 50 MG Oral Tablet] 90 tablet 3    Sig: TAKE 1 TABLET BY MOUTH AT  BEDTIME      Psychiatry: Antidepressants - Serotonin Modulator Passed - 10/15/2019  5:02 AM      Passed - Valid encounter within last 6 months    Recent Outpatient Visits           1 month ago Annual physical exam   [facility] Glean Hess, MD   5 months ago Dermatitis   [facility] Glean Hess, MD   7 months ago Essential hypertension   [facility] Glean Hess, MD   1 year ago Annual physical exam   [facility] Glean Hess, MD   1 year ago [facility] Glean Hess, MD       Future Appointments  In 3 days Glean Hess, MD [facility], [place]   In 4 months Army Melia Jesse Sans, MD [facility], [place]   In 10 months Army Melia Jesse Sans, MD [facility], [facility]

## 2019-10-18 ENCOUNTER — Encounter: Payer: Self-pay | Admitting: Internal Medicine

## 2019-10-18 ENCOUNTER — Ambulatory Visit (INDEPENDENT_AMBULATORY_CARE_PROVIDER_SITE_OTHER): Payer: Medicare Other | Admitting: Internal Medicine

## 2019-10-18 ENCOUNTER — Other Ambulatory Visit: Payer: Self-pay

## 2019-10-18 VITALS — BP 108/74 | HR 86 | Temp 98.5°F | Ht 60.0 in | Wt 137.0 lb

## 2019-10-18 DIAGNOSIS — I2510 Atherosclerotic heart disease of native coronary artery without angina pectoris: Secondary | ICD-10-CM

## 2019-10-18 DIAGNOSIS — E118 Type 2 diabetes mellitus with unspecified complications: Secondary | ICD-10-CM

## 2019-10-18 DIAGNOSIS — H6121 Impacted cerumen, right ear: Secondary | ICD-10-CM | POA: Diagnosis not present

## 2019-10-18 DIAGNOSIS — I10 Essential (primary) hypertension: Secondary | ICD-10-CM

## 2019-10-18 DIAGNOSIS — K219 Gastro-esophageal reflux disease without esophagitis: Secondary | ICD-10-CM | POA: Diagnosis not present

## 2019-10-18 MED ORDER — METOPROLOL TARTRATE 50 MG PO TABS: 50.0000 mg | ORAL_TABLET | Freq: Two times a day (BID) | ORAL | 3 refills | Status: DC

## 2019-10-18 MED ORDER — GLIMEPIRIDE 2 MG PO TABS: 2.0000 mg | ORAL_TABLET | Freq: Every day | ORAL | 1 refills | Status: DC

## 2019-10-18 MED ORDER — OMEPRAZOLE 20 MG PO CPDR: 20.0000 mg | DELAYED_RELEASE_CAPSULE | Freq: Every day | ORAL | 3 refills | Status: DC

## 2019-10-18 NOTE — Progress Notes (Signed)
Date:  10/18/2019   Name:  Elizabeth Klein   DOB:  07/21/1950   MRN:  [id number]   Chief Complaint: Hearing Loss (X1.5 months, both ears itching on the inside, put baby oil/olive oil in her ears and cleaned with qtip nothing came out, sometimes she can hear   )  Ear Fullness  There is pain in both ears. This is a new problem. The problem has been unchanged. There has been no fever. Associated symptoms include hearing loss. Pertinent negatives include no coughing or headaches. Treatments tried: olive oil and Q-tips. The treatment provided no relief.    Lab Results  Component Value Date   CREATININE 1.08 (H) 08/23/2019   BUN 25 (H) 08/23/2019   NA 138 08/23/2019   K 4.8 08/23/2019   CL 108 08/23/2019   CO2 23 08/23/2019   Lab Results  Component Value Date   CHOL 115 08/20/2018   HDL 41 08/20/2018   LDLCALC 38 08/20/2018   TRIG 182 (H) 08/20/2018   CHOLHDL 2.8 08/20/2018   Lab Results  Component Value Date   TSH 1.271 08/23/2019   Lab Results  Component Value Date   HGBA1C 7.4 (H) 08/23/2019   Lab Results  Component Value Date   WBC 10.7 (H) 08/23/2019   HGB 11.7 (L) 08/23/2019   HCT 36.8 08/23/2019   MCV 91.5 08/23/2019   PLT 306 08/23/2019   Lab Results  Component Value Date   ALT 28 08/23/2019   AST 23 08/23/2019   ALKPHOS 83 08/23/2019   BILITOT 0.6 08/23/2019     Review of Systems  Constitutional: Negative for chills and fatigue.  HENT: Positive for hearing loss. Negative for ear pain, postnasal drip, tinnitus and trouble swallowing.   Respiratory: Negative for cough, chest tightness and shortness of breath.   Cardiovascular: Negative for chest pain.  Neurological: Negative for dizziness, light-headedness and headaches.    Patient Active Problem List   Diagnosis Date Noted  . Oropharyngeal dysphagia 02/23/2019  . Neuropathy due to type 2 diabetes mellitus ([place]) 08/20/2018  . Finger injury, left, sequela 08/20/2018  . Microalbuminuria  04/30/2017  . CKD (chronic kidney disease) stage 3, GFR 30-59 ml/min 04/30/2017  . Type II diabetes mellitus with complication ([place]) 76/73/4193  . Essential hypertension 03/19/2017  . CAD (coronary artery disease) 03/19/2017  . History of kidney stones 03/19/2017  . Psoriasis 03/19/2017  . Hyperlipidemia associated with type 2 diabetes mellitus ([place]) 03/19/2017  . Nocturnal leg cramps 03/19/2017  . Gait instability 03/19/2017  . Insomnia 03/19/2017  . Allergic rhinitis 03/19/2017    Allergies  Allergen Reactions  . Prednisone Other (See Comments)    Intolerance  - confusion     Past Surgical History:  Procedure Laterality Date  . BREAST BIOPSY Right    neg  . CATARACT EXTRACTION W/PHACO Left 09/27/2019   Procedure: CATARACT EXTRACTION PHACO AND INTRAOCULAR LENS PLACEMENT (IOC) LEFT DIABETIC ISTENT INJ INTRAVITREAL KENALOG INJ;  Surgeon: Eulogio Bear, MD;  Location: [place];  Service: Ophthalmology;  Laterality: Left;  2.83 0:28.7  . CESAREAN SECTION      Social History   Tobacco Use  . Smoking status: Never Smoker  . Smokeless tobacco: Never Used  Vaping Use  . Vaping Use: Never used  Substance Use Topics  . Alcohol use: No  . Drug use: No     Medication list has been reviewed and updated.  Current Meds  Medication Sig  . acetaminophen (TYLENOL) 500 MG  tablet Take 1,000 mg by mouth every 8 (eight) hours as needed.  Marland Kitchen aspirin EC 81 MG tablet Take 1 tablet (81 mg total) by mouth daily.  Marland Kitchen atorvastatin (LIPITOR) 80 MG tablet TAKE 1 TABLET BY MOUTH  DAILY  . BD INSULIN SYRINGE U/F 30G X 1/2" 0.5 ML MISC USE WITH LANTUS INJECTIONS  FOR DM2  . glimepiride (AMARYL) 4 MG tablet Take 1 tablet (4 mg total) by mouth daily with breakfast. (Patient taking differently: Take 2 mg by mouth daily with breakfast. )  . glucose blood (ACCU-CHEK SMARTVIEW) test strip USE 3 TIMES DAILY  . Halobetasol Prop-Tazarotene (DUOBRII) 0.01-0.045 % LOTN Apply 1 application  topically 2 (two) times daily.  . insulin glargine (LANTUS) 100 UNIT/ML injection Inject 0.4 mLs (40 Units total) into the skin every morning. Pt taking QAM (Patient taking differently: Inject 26 Units into the skin every morning. Pt taking QAM)  . latanoprost (XALATAN) 0.005 % ophthalmic solution SMARTSIG:1 Drop(s) In Eye(s) Every Evening  . losartan (COZAAR) 50 MG tablet TAKE 1 TABLET BY MOUTH  DAILY (Patient taking differently: To protect kidneys)  . metFORMIN (GLUCOPHAGE) 1000 MG tablet TAKE 1 TABLET BY MOUTH  TWICE DAILY WITH MEALS  . metoprolol tartrate (LOPRESSOR) 50 MG tablet TAKE 1 TABLET BY MOUTH  TWICE DAILY  . montelukast (SINGULAIR) 10 MG tablet TAKE 1 TABLET BY MOUTH  DAILY  . omeprazole (PRILOSEC) 20 MG capsule TAKE 1 CAPSULE BY MOUTH  DAILY  . ONETOUCH DELICA PLUS LANCETS MISC 1 each by Does not apply route 2 (two) times daily.  . Semaglutide, 1 MG/DOSE, (OZEMPIC, 1 MG/DOSE,) 4 MG/3ML SOPN Inject into the skin. Inject 1 mg into the skin every 7 days  . Syringe, Disposable, (B-D 30CC SYRINGE) 30 ML MISC Use with Lantus Injections for DM2 ICD 10 E11.9  . traZODone (DESYREL) 50 MG tablet TAKE 1 TABLET BY MOUTH AT  BEDTIME    PHQ 2/9 Scores 10/13/2019 08/23/2019 05/05/2019 10/07/2018  PHQ - 2 Score 0 0 0 2  PHQ- 9 Score - 4 0 3    GAD 7 : Generalized Anxiety Score 08/23/2019 05/05/2019  Nervous, Anxious, on Edge 1 0  Control/stop worrying 0 1  Worry too much - different things 0 1  Trouble relaxing 0 0  Restless 0 0  Easily annoyed or irritable 1 0  Afraid - awful might happen 0 0  Total GAD 7 Score 2 2  Anxiety Difficulty Not difficult at all Not difficult at all    BP Readings from Last 3 Encounters:  10/18/19 108/74  09/27/19 (!) 140/71  08/23/19 118/76    Physical Exam Vitals and nursing note reviewed.  Constitutional:      General: She is not in acute distress.    Appearance: She is well-developed.  HENT:     Head: Normocephalic and atraumatic.     Right Ear:  External ear normal. There is impacted cerumen.     Left Ear: Tympanic membrane, ear canal and external ear normal. There is no impacted cerumen (moderate cerumen).  Cardiovascular:     Rate and Rhythm: Normal rate and regular rhythm.     Pulses: Normal pulses.     Heart sounds: No murmur heard.   Pulmonary:     Effort: Pulmonary effort is normal. No respiratory distress.     Breath sounds: No wheezing or rhonchi.  Musculoskeletal:     Cervical back: Normal range of motion and neck supple.  Skin:    General: Skin  is warm and dry.     Findings: No rash.  Neurological:     Mental Status: She is alert and oriented to person, place, and time.  Psychiatric:        Behavior: Behavior normal.        Thought Content: Thought content normal.     Wt Readings from Last 3 Encounters:  10/18/19 137 lb (62.1 kg)  09/27/19 139 lb (63 kg)  08/23/19 140 lb (63.5 kg)    BP 108/74   Pulse 86   Temp 98.5 F (36.9 C) (Oral)   Ht 5' (1.524 m)   Wt 137 lb (62.1 kg)   SpO2 97%   BMI 26.76 kg/m   Assessment and Plan: 1. Hearing loss of right ear due to cerumen impaction Needs ENT to remove and do audiometry - Ambulatory referral to ENT  2. Essential hypertension - metoprolol tartrate (LOPRESSOR) 50 MG tablet; Take 1 tablet (50 mg total) by mouth 2 (two) times daily.  Dispense: 180 tablet; Refill: 3  3. Coronary artery disease involving native coronary artery of native heart without angina pectoris - metoprolol tartrate (LOPRESSOR) 50 MG tablet; Take 1 tablet (50 mg total) by mouth 2 (two) times daily.  Dispense: 180 tablet; Refill: 3  4. Gastroesophageal reflux disease - omeprazole (PRILOSEC) 20 MG capsule; Take 1 capsule (20 mg total) by mouth daily.  Dispense: 90 capsule; Refill: 3  5. Type II diabetes mellitus with complication (HCC) - glimepiride (AMARYL) 2 MG tablet; Take 1 tablet (2 mg total) by mouth daily with breakfast.  Dispense: 90 tablet; Refill: 1   Partially dictated  using Editor, commissioning. Any errors are unintentional.  Halina Maidens, MD [facility]  10/18/2019

## 2019-10-25 ENCOUNTER — Other Ambulatory Visit: Payer: Self-pay

## 2019-10-25 ENCOUNTER — Ambulatory Visit
Admission: RE | Admit: 2019-10-25 | Discharge: 2019-10-25 | Disposition: A | Discharge status: Home / Self Care | Payer: Medicare Other | Source: Ambulatory Visit | Attending: Internal Medicine | Admitting: Internal Medicine

## 2019-10-25 DIAGNOSIS — H6123 Impacted cerumen, bilateral: Secondary | ICD-10-CM | POA: Diagnosis not present

## 2019-10-25 DIAGNOSIS — H903 Sensorineural hearing loss, bilateral: Secondary | ICD-10-CM | POA: Diagnosis not present

## 2019-10-25 DIAGNOSIS — Z1231 Encounter for screening mammogram for malignant neoplasm of breast: Secondary | ICD-10-CM | POA: Diagnosis not present

## 2019-11-16 ENCOUNTER — Telehealth: Payer: Self-pay | Admitting: Internal Medicine

## 2019-11-16 NOTE — Telephone Encounter (Signed)
Copied from [place] [id number]. Topic: General - Inquiry >> Nov 16, 2019  2:41 PM Scherrie Gerlach wrote: Reason for CRM: pt wants to know if she is immunocompromised so she get the booster covid vaccine.

## 2019-11-17 NOTE — Telephone Encounter (Signed)
Called and left patient a message informing her that she is not immunocompromised and under the current guidelines she cannot get the booster at this time.   CM

## 2019-11-26 DIAGNOSIS — H2511 Age-related nuclear cataract, right eye: Secondary | ICD-10-CM | POA: Diagnosis not present

## 2019-11-26 DIAGNOSIS — E119 Type 2 diabetes mellitus without complications: Secondary | ICD-10-CM | POA: Diagnosis not present

## 2019-12-03 ENCOUNTER — Ambulatory Visit: Payer: Self-pay

## 2019-12-07 ENCOUNTER — Other Ambulatory Visit: Payer: Self-pay

## 2019-12-07 ENCOUNTER — Other Ambulatory Visit: Payer: Self-pay | Admitting: Internal Medicine

## 2019-12-07 ENCOUNTER — Encounter: Payer: Self-pay | Admitting: Ophthalmology

## 2019-12-07 DIAGNOSIS — L281 Prurigo nodularis: Secondary | ICD-10-CM | POA: Diagnosis not present

## 2019-12-07 DIAGNOSIS — L409 Psoriasis, unspecified: Secondary | ICD-10-CM

## 2019-12-07 NOTE — Anesthesia Preprocedure Evaluation (Addendum)
Anesthesia Evaluation  Patient identified by MRN, date of birth, ID band Patient awake    Reviewed: Allergy & Precautions, NPO status , Patient's Chart, lab work & pertinent test results, reviewed documented beta blocker date and time   History of Anesthesia Complications Negative for: history of anesthetic complications  Airway Mallampati: II  TM Distance: >3 FB Neck ROM: Full    Dental   Pulmonary    breath sounds clear to auscultation       Cardiovascular hypertension, (-) angina+ CAD  (-) DOE  Rhythm:Regular Rate:Normal   HLD   Neuro/Psych  Headaches,  Neuromuscular disease (Diabetic peripheral neuropathy)    GI/Hepatic GERD  Controlled, Dysphagia   Endo/Other  diabetes  Renal/GU CRFRenal disease (Stones)     Musculoskeletal   Abdominal   Peds  Hematology   Anesthesia Other Findings   Reproductive/Obstetrics                            Anesthesia Physical Anesthesia Plan  ASA: II  Anesthesia Plan: MAC   Post-op Pain Management:    Induction: Intravenous  PONV Risk Score and Plan: 2 and TIVA, Midazolam and Treatment may vary due to age or medical condition  Airway Management Planned: Nasal Cannula  Additional Equipment:   Intra-op Plan:   Post-operative Plan:   Informed Consent: I have reviewed the patients History and Physical, chart, labs and discussed the procedure including the risks, benefits and alternatives for the proposed anesthesia with the patient or authorized representative who has indicated his/her understanding and acceptance.       Plan Discussed with: CRNA and Anesthesiologist  Anesthesia Plan Comments:         Anesthesia Quick Evaluation

## 2019-12-07 NOTE — Telephone Encounter (Signed)
Requested medication (s) are due for refill today - unknown  Requested medication (s) are on the active medication list -yes  Future visit scheduled -yes  Last refill: 04/16/19  Notes to clinic: Request medication not assigned protocol  Requested Prescriptions  Pending Prescriptions Disp Refills   DUOBRII 0.01-0.045 % LOTN [Pharmacy Med Name: DUOBRII LOTION 0.01-0.045%] 100 g 1    Sig: Apply 1 application topically 2 (two) times daily.      Off-Protocol Failed - 12/07/2019  4:01 PM      Failed - Medication not assigned to a protocol, review manually.      Passed - Valid encounter within last 12 months    Recent Outpatient Visits           1 month ago Hearing loss of right ear due to cerumen impaction   [facility] Glean Hess, MD   3 months ago Annual physical exam   [facility] Glean Hess, MD   7 months ago Dermatitis   [facility] Glean Hess, MD   9 months ago Essential hypertension   [facility] Glean Hess, MD   1 year ago Annual physical exam   [facility] Glean Hess, MD       Future Appointments             In 2 months Army Melia Jesse Sans, MD [facility], [place]   In 8 months Army Melia Jesse Sans, MD [facility], [place] Prescriptions  Pending Prescriptions Disp Refills   DUOBRII 0.01-0.045 % LOTN [Pharmacy Med Name: DUOBRII LOTION 0.01-0.045%] 100 g 1    Sig: Apply 1 application topically 2 (two) times daily.      Off-Protocol Failed - 12/07/2019  4:01 PM      Failed - Medication not assigned to a protocol, review manually.      Passed - Valid encounter within last 12 months    Recent Outpatient Visits           1 month ago Hearing loss of right ear due to cerumen impaction   [facility] Glean Hess, MD   3 months ago Annual physical exam   [facility] Glean Hess, MD   7 months ago Dermatitis    [facility] Glean Hess, MD   9 months ago Essential hypertension   [facility] Glean Hess, MD   1 year ago Annual physical exam   [facility] Glean Hess, MD       Future Appointments             In 2 months Army Melia Jesse Sans, MD [facility], [place]   In 8 months Army Melia Jesse Sans, MD [facility], [facility]

## 2019-12-09 ENCOUNTER — Other Ambulatory Visit: Payer: Self-pay

## 2019-12-09 ENCOUNTER — Other Ambulatory Visit
Admission: RE | Admit: 2019-12-09 | Discharge: 2019-12-09 | Disposition: A | Discharge status: Home / Self Care | Payer: Medicare Other | Source: Ambulatory Visit | Attending: Ophthalmology | Admitting: Ophthalmology

## 2019-12-09 DIAGNOSIS — Z01812 Encounter for preprocedural laboratory examination: Secondary | ICD-10-CM | POA: Insufficient documentation

## 2019-12-09 DIAGNOSIS — Z20822 Contact with and (suspected) exposure to covid-19: Secondary | ICD-10-CM | POA: Insufficient documentation

## 2019-12-09 LAB — SARS CORONAVIRUS 2 (TAT 6-24 HRS): SARS Coronavirus 2: NEGATIVE

## 2019-12-09 NOTE — Discharge Instructions (Signed)

## 2019-12-13 ENCOUNTER — Ambulatory Visit
Admission: RE | Admit: 2019-12-13 | Discharge: 2019-12-13 | Disposition: A | Discharge status: Home / Self Care | Payer: Medicare Other | Attending: Ophthalmology | Admitting: Ophthalmology

## 2019-12-13 ENCOUNTER — Encounter
Admission: RE | Disposition: A | Discharge status: Home / Self Care | Payer: Self-pay | Source: Home / Self Care | Attending: Ophthalmology

## 2019-12-13 ENCOUNTER — Ambulatory Visit: Payer: Medicare Other | Admitting: Anesthesiology

## 2019-12-13 ENCOUNTER — Other Ambulatory Visit: Payer: Self-pay

## 2019-12-13 ENCOUNTER — Encounter: Payer: Self-pay | Admitting: Ophthalmology

## 2019-12-13 DIAGNOSIS — Z794 Long term (current) use of insulin: Secondary | ICD-10-CM | POA: Diagnosis not present

## 2019-12-13 DIAGNOSIS — H25811 Combined forms of age-related cataract, right eye: Secondary | ICD-10-CM | POA: Diagnosis not present

## 2019-12-13 DIAGNOSIS — K219 Gastro-esophageal reflux disease without esophagitis: Secondary | ICD-10-CM | POA: Insufficient documentation

## 2019-12-13 DIAGNOSIS — E113211 Type 2 diabetes mellitus with mild nonproliferative diabetic retinopathy with macular edema, right eye: Secondary | ICD-10-CM | POA: Insufficient documentation

## 2019-12-13 DIAGNOSIS — F419 Anxiety disorder, unspecified: Secondary | ICD-10-CM | POA: Insufficient documentation

## 2019-12-13 DIAGNOSIS — Z9842 Cataract extraction status, left eye: Secondary | ICD-10-CM | POA: Diagnosis not present

## 2019-12-13 DIAGNOSIS — H401111 Primary open-angle glaucoma, right eye, mild stage: Secondary | ICD-10-CM | POA: Diagnosis not present

## 2019-12-13 DIAGNOSIS — Z79899 Other long term (current) drug therapy: Secondary | ICD-10-CM | POA: Insufficient documentation

## 2019-12-13 DIAGNOSIS — E1140 Type 2 diabetes mellitus with diabetic neuropathy, unspecified: Secondary | ICD-10-CM | POA: Diagnosis not present

## 2019-12-13 DIAGNOSIS — E7800 Pure hypercholesterolemia, unspecified: Secondary | ICD-10-CM | POA: Insufficient documentation

## 2019-12-13 DIAGNOSIS — Z888 Allergy status to other drugs, medicaments and biological substances status: Secondary | ICD-10-CM | POA: Insufficient documentation

## 2019-12-13 DIAGNOSIS — E1136 Type 2 diabetes mellitus with diabetic cataract: Secondary | ICD-10-CM | POA: Insufficient documentation

## 2019-12-13 DIAGNOSIS — H2511 Age-related nuclear cataract, right eye: Secondary | ICD-10-CM | POA: Diagnosis not present

## 2019-12-13 DIAGNOSIS — Z7984 Long term (current) use of oral hypoglycemic drugs: Secondary | ICD-10-CM | POA: Insufficient documentation

## 2019-12-13 DIAGNOSIS — Z7982 Long term (current) use of aspirin: Secondary | ICD-10-CM | POA: Diagnosis not present

## 2019-12-13 HISTORY — PX: CATARACT EXTRACTION W/PHACO: SHX586

## 2019-12-13 LAB — GLUCOSE, CAPILLARY
Glucose-Capillary: 123 mg/dL — ABNORMAL HIGH (ref 70–99)
Glucose-Capillary: 166 mg/dL — ABNORMAL HIGH (ref 70–99)

## 2019-12-13 SURGERY — PHACOEMULSIFICATION, CATARACT, WITH IOL INSERTION
Anesthesia: Monitor Anesthesia Care | Site: Eye | Laterality: Right

## 2019-12-13 MED ORDER — LIDOCAINE HCL (PF) 2 % IJ SOLN
INTRAOCULAR | Status: DC | PRN
  Administered 2019-12-13: 1 mL via INTRAOCULAR

## 2019-12-13 MED ORDER — MIDAZOLAM HCL 2 MG/2ML IJ SOLN
INTRAMUSCULAR | Status: DC | PRN
  Administered 2019-12-13: 2 mg via INTRAVENOUS

## 2019-12-13 MED ORDER — ACETAMINOPHEN 10 MG/ML IV SOLN: 1000.0000 mg | Freq: Once | INTRAVENOUS | Status: DC | PRN

## 2019-12-13 MED ORDER — TRIAMCINOLONE ACETONIDE 40 MG/ML IJ SUSP
INTRAMUSCULAR | Status: DC | PRN
  Administered 2019-12-13: 40 mg

## 2019-12-13 MED ORDER — SODIUM HYALURONATE 10 MG/ML IO SOLN
INTRAOCULAR | Status: DC | PRN
  Administered 2019-12-13: 0.55 mL via INTRAOCULAR

## 2019-12-13 MED ORDER — ARMC OPHTHALMIC DILATING DROPS
1.0000 "application " | OPHTHALMIC | Status: DC | PRN
  Administered 2019-12-13 (×3): 1 via OPHTHALMIC

## 2019-12-13 MED ORDER — FENTANYL CITRATE (PF) 100 MCG/2ML IJ SOLN
INTRAMUSCULAR | Status: DC | PRN
  Administered 2019-12-13: 50 ug via INTRAVENOUS

## 2019-12-13 MED ORDER — LACTATED RINGERS IV SOLN: INTRAVENOUS | Status: DC

## 2019-12-13 MED ORDER — SODIUM HYALURONATE 23 MG/ML IO SOLN
INTRAOCULAR | Status: DC | PRN
  Administered 2019-12-13: 0.6 mL via INTRAOCULAR

## 2019-12-13 MED ORDER — ONDANSETRON HCL 4 MG/2ML IJ SOLN: 4.0000 mg | Freq: Once | INTRAMUSCULAR | Status: DC | PRN

## 2019-12-13 MED ORDER — EPINEPHRINE PF 1 MG/ML IJ SOLN
INTRAOCULAR | Status: DC | PRN
  Administered 2019-12-13: 62 mL via OPHTHALMIC

## 2019-12-13 MED ORDER — TETRACAINE HCL 0.5 % OP SOLN
1.0000 [drp] | OPHTHALMIC | Status: DC | PRN
  Administered 2019-12-13 (×3): 1 [drp] via OPHTHALMIC

## 2019-12-13 MED ORDER — MOXIFLOXACIN HCL 0.5 % OP SOLN
OPHTHALMIC | Status: DC | PRN
  Administered 2019-12-13: 0.2 mL via OPHTHALMIC

## 2019-12-13 SURGICAL SUPPLY — 24 items
CANNULA ANT/CHMB 27G (MISCELLANEOUS) ×2 IMPLANT
CANNULA ANT/CHMB 27GA (MISCELLANEOUS) ×6 IMPLANT
DEVICE INJECT ISTENT W (Stent) IMPLANT
DISSECTOR HYDRO NUCLEUS 50X22 (MISCELLANEOUS) ×3 IMPLANT
GLOVE SURG LX 7.5 STRW (GLOVE) ×4
GLOVE SURG LX STRL 7.5 STRW (GLOVE) ×1 IMPLANT
GLOVE SURG SYN 8.5  E (GLOVE) ×2
GLOVE SURG SYN 8.5 E (GLOVE) ×1 IMPLANT
GLOVE SURG SYN 8.5 PF PI (GLOVE) ×1 IMPLANT
GONIO PRISM W/BUILT IN CLIP (KITS) ×3
GONIOPRISM W/BUILT IN CLIP (KITS) IMPLANT
GOWN STRL REUS W/ TWL LRG LVL3 (GOWN DISPOSABLE) ×2 IMPLANT
GOWN STRL REUS W/TWL LRG LVL3 (GOWN DISPOSABLE) ×6
INJECT ISTENT W (Stent) ×6 IMPLANT
LENS IOL DIOP 22.0 (Intraocular Lens) ×3 IMPLANT
LENS IOL TECNIS MONO 22.0 (Intraocular Lens) IMPLANT
MARKER SKIN DUAL TIP RULER LAB (MISCELLANEOUS) ×3 IMPLANT
PACK DR. KING ARMS (PACKS) ×3 IMPLANT
PACK EYE AFTER SURG (MISCELLANEOUS) ×3 IMPLANT
PACK OPTHALMIC (MISCELLANEOUS) ×3 IMPLANT
SYR 3ML LL SCALE MARK (SYRINGE) ×3 IMPLANT
SYR TB 1ML LUER SLIP (SYRINGE) ×3 IMPLANT
WATER STERILE IRR 250ML POUR (IV SOLUTION) ×3 IMPLANT
WIPE NON LINTING 3.25X3.25 (MISCELLANEOUS) ×3 IMPLANT

## 2019-12-13 NOTE — Transfer of Care (Signed)
Immediate Anesthesia Transfer of Care Note  Patient: Elizabeth Klein  Procedure(s) Performed: CATARACT EXTRACTION PHACO AND INTRAOCULAR LENS PLACEMENT (IOC) RIGHT ISTENT INJ KENALOG INJ DIABETIC (Right Eye)  Patient Location: PACU  Anesthesia Type: MAC  Level of Consciousness: awake, alert  and patient cooperative  Airway and Oxygen Therapy: Patient Spontanous Breathing and Patient connected to supplemental oxygen  Post-op Assessment: Post-op Vital signs reviewed, Patient's Cardiovascular Status Stable, Respiratory Function Stable, Patent Airway and No signs of Nausea or vomiting  Post-op Vital Signs: Reviewed and stable  Complications: No complications documented.

## 2019-12-13 NOTE — Anesthesia Procedure Notes (Signed)
Procedure Name: MAC Date/Time: 12/13/2019 9:33 AM Performed by: Silvana Newness, CRNA Pre-anesthesia Checklist: Patient identified, Emergency Drugs available, Suction available, Patient being monitored and Timeout performed Oxygen Delivery Method: Nasal cannula Placement Confirmation: positive ETCO2

## 2019-12-13 NOTE — Anesthesia Postprocedure Evaluation (Signed)
Anesthesia Post Note  Patient: Elizabeth Klein  Procedure(s) Performed: CATARACT EXTRACTION PHACO AND INTRAOCULAR LENS PLACEMENT (IOC) RIGHT ISTENT INJ KENALOG INJ DIABETIC (Right Eye)     Patient location during evaluation: PACU Anesthesia Type: MAC Level of consciousness: awake and alert Pain management: pain level controlled Vital Signs Assessment: post-procedure vital signs reviewed and stable Respiratory status: spontaneous breathing, nonlabored ventilation, respiratory function stable and patient connected to nasal cannula oxygen Cardiovascular status: stable and blood pressure returned to baseline Postop Assessment: no apparent nausea or vomiting Anesthetic complications: no   No complications documented.  Sheralee Qazi A  Janeece Blok

## 2019-12-13 NOTE — H&P (Signed)

## 2019-12-13 NOTE — Op Note (Signed)
OPERATIVE NOTE  Elizabeth Klein [id number] 12/13/2019  PREOPERATIVE DIAGNOSIS:   1.  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  2.  Nuclear sclerotic cataract right eye.  H25.11 3.  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   POSTOPERATIVE DIAGNOSIS:    same.   PROCEDURE:   1.  Placement of trabecular bypass stent (istent). CPT 0191T  and placement of additional stent  CPT 0376T 2.  Phacoemusification with posterior chamber intraocular lens placement of the right eye  CPT [phone] 3.  Intravitreal injection of kenalog, right eye CPT [phone]   LENS: Implant Name Type Inv. Item Serial No. Manufacturer Lot No. LRB No. Used Action  Marko Stai - [id number] Stent INJECT [employer] [id number] Right 2 Implanted  LENS IOL DIOP 22.0 - [id number] Intraocular Lens LENS IOL DIOP 22.0 [id number]   Right 1 Implanted      Procedure(s) with comments: CATARACT EXTRACTION PHACO AND INTRAOCULAR LENS PLACEMENT (IOC) RIGHT ISTENT INJ KENALOG INJ DIABETIC (Right) - 1.39 0:21.5    ULTRASOUND TIME: 0 minutes 21.5 seconds.  CDE 1.39   SURGEON:  Benay Pillow, MD, MPH  ANESTHESIOLOGIST: Anesthesiologist: Heniser, Fredric Dine, MD CRNA: Silvana Newness, CRNA   ANESTHESIA:  MAC and intracameral preservative-free lidocaine 4%.  ESTIMATED BLOOD LOSS: less than 1 mL.   COMPLICATIONS:  None.   DESCRIPTION OF PROCEDURE:  The patient was identified in the holding room and transported to the operating room.   The patient was placed in the supine position under the operating microscope.  The right eye was prepped and draped in the usual sterile ophthalmic fashion.   A 1.0 millimeter clear-corneal paracentesis was made at the 10:30 position. 0.5 ml of preservative-free 1% lidocaine with epinephrine was injected into the anterior chamber.  The anterior chamber was filled with Healon 5 viscoelastic.  A 2.4 millimeter keratome was used to  make a near-clear corneal incision at the 8:00 position.   Attention was turned to the istent.  The patients head was turned to the left and the microscope was tilted to 035 degrees.  Ocular instruments/Glaukos OAL/H2 gonioprism was used with IPC05 (iclip) coupled with Healon 5 on the cornea was used to visualize the trabecular meshwork. The istent was opened and introduced into the eye.  The meshwork was engaged with the tip of the iStent injector and the stent was deployed into Schlemm's canal at 4:00.  The second stent was deployed at 2:00, but did not seat.  It was rethreaded and attempted to inect but again did not stay seated in the TM.  It was rethreaded and removed from the eye with confirmation by visualization from the microscope. A second istent inject was opened and was successfully implanted at 2:00.  The stents were well seated and in good position.  Next, attention was turned to the phacoemulsification A curvilinear capsulorrhexis was made with a cystotome and capsulorrhexis forceps.  Balanced salt solution was used to hydrodissect and hydrodelineate the nucleus.   Phacoemulsification was then used in stop and chop fashion to remove the lens nucleus and epinucleus.  The remaining cortex was then removed using the irrigation and aspiration handpiece. Healon was then placed into the capsular bag to distend it for lens placement.  A lens was then injected into the capsular bag.  The remaining viscoelastic was aspirated.   Wounds were hydrated with balanced salt solution.  The anterior chamber was inflated to a physiologic pressure with balanced salt  solution.   Intracameral vigamox 0.1 mL undiluted was injected into the eye and a drop placed onto the ocular surface.  Calipers were used to mark 3.5 mm posterior to the limbus in the inferotemporal quadrant.  0.1 mL of Kenalog 40 mg/mL was injected into the vitreous cavity.  No wound leaks were noted. The patient was taken to the recovery  room in stable condition without complications of anesthesia or surgery   Benay Pillow 12/13/2019, 10:09 AM

## 2019-12-14 ENCOUNTER — Encounter: Payer: Self-pay | Admitting: Ophthalmology

## 2019-12-20 DIAGNOSIS — I152 Hypertension secondary to endocrine disorders: Secondary | ICD-10-CM | POA: Diagnosis not present

## 2019-12-20 DIAGNOSIS — E1159 Type 2 diabetes mellitus with other circulatory complications: Secondary | ICD-10-CM | POA: Diagnosis not present

## 2019-12-20 DIAGNOSIS — E1142 Type 2 diabetes mellitus with diabetic polyneuropathy: Secondary | ICD-10-CM | POA: Diagnosis not present

## 2019-12-20 DIAGNOSIS — E1169 Type 2 diabetes mellitus with other specified complication: Secondary | ICD-10-CM | POA: Diagnosis not present

## 2019-12-20 DIAGNOSIS — Z794 Long term (current) use of insulin: Secondary | ICD-10-CM | POA: Diagnosis not present

## 2019-12-20 LAB — HEMOGLOBIN A1C: Hemoglobin A1C: 6.6

## 2019-12-21 LAB — HM DIABETES EYE EXAM

## 2020-01-03 ENCOUNTER — Telehealth: Payer: Self-pay

## 2020-01-03 NOTE — Telephone Encounter (Signed)
Copied from [place] [id number]. Topic: General - Inquiry >> Jan 03, 2020  2:26 PM Lennox Solders wrote: Reason for CRM:Pt  needs the form to be completed by  5 pm 01-05-2020. Pt son fax the jury duty letter and needs detail doctor statement  . Pt is schedule for jury duty 01-12-2020. Please call [place] at [phone] once the form has been recieved

## 2020-01-03 NOTE — Telephone Encounter (Signed)
I don't think she has anything to get her excused from [employer].

## 2020-01-03 NOTE — Telephone Encounter (Signed)
Spoke with patients son and informed. He verbalized understanding and said he will tell his mother.

## 2020-01-04 ENCOUNTER — Other Ambulatory Visit: Payer: Self-pay | Admitting: Internal Medicine

## 2020-01-04 DIAGNOSIS — E785 Hyperlipidemia, unspecified: Secondary | ICD-10-CM

## 2020-01-04 DIAGNOSIS — J309 Allergic rhinitis, unspecified: Secondary | ICD-10-CM

## 2020-01-04 NOTE — Telephone Encounter (Signed)
Patient would like the nurse to call her to explain further why she cannot get excused from jury duty.  Please call patient to discuss at [phone]

## 2020-01-04 NOTE — Telephone Encounter (Signed)
Requested medication (s) are due for refill today: yes  Requested medication (s) are on the active medication list:yes  Last refill:  Atorvastatin 10/15/19  #30  0 refills Singular  12/23/18  #90 3 refills   Future visit scheduled: yes  Notes to clinic:  Labs  (Lipids) are from 08/20/18 . Singular Rx has expired.    Requested Prescriptions  Pending Prescriptions Disp Refills   atorvastatin (LIPITOR) 80 MG tablet [Pharmacy Med Name: Atorvastatin Calcium 80 MG Oral Tablet] 30 tablet 0    Sig: TAKE 1 TABLET BY MOUTH  DAILY      Cardiovascular:  Antilipid - Statins Failed - 01/04/2020  3:44 PM      Failed - Total Cholesterol in normal range and within 360 days    Cholesterol, Total  Date Value Ref Range Status  08/20/2018 115 100 - 199 mg/dL Final          Failed - LDL in normal range and within 360 days    LDL Calculated  Date Value Ref Range Status  08/20/2018 38 0 - 99 mg/dL Final          Failed - HDL in normal range and within 360 days    HDL  Date Value Ref Range Status  08/20/2018 41 >39 mg/dL Final          Failed - Triglycerides in normal range and within 360 days    Triglycerides  Date Value Ref Range Status  08/20/2018 182 (H) 0 - 149 mg/dL Final          Passed - Patient is not pregnant      Passed - Valid encounter within last 12 months    Recent Outpatient Visits           2 months ago Hearing loss of right ear due to cerumen impaction   [facility] Glean Hess, MD   4 months ago Annual physical exam   [facility] Glean Hess, MD   8 months ago Dermatitis   [facility] Glean Hess, MD   10 months ago Essential hypertension   [facility] Glean Hess, MD   1 year ago Annual physical exam   [facility] Glean Hess, MD       Future Appointments             In 1 month Army Melia Jesse Sans, MD [facility], [place]   In 7 months Army Melia, Jesse Sans, MD [facility]              montelukast (SINGULAIR) 10 MG tablet [Pharmacy Med Name: Montelukast Sodium 10 MG Oral Tablet] 90 tablet 3    Sig: TAKE 1 TABLET BY MOUTH  DAILY      Pulmonology:  Leukotriene Inhibitors Passed - 01/04/2020  3:44 PM      Passed - Valid encounter within last 12 months    Recent Outpatient Visits           2 months ago Hearing loss of right ear due to cerumen impaction   [facility] Glean Hess, MD   4 months ago Annual physical exam   [facility] Glean Hess, MD   8 months ago Dermatitis   [facility] Glean Hess, MD   10 months ago Essential hypertension   [facility] Glean Hess, MD   1 year ago Annual physical exam  [facility] Glean Hess, MD       Future Appointments             In 1 month Army Melia Jesse Sans, MD [facility], [place]   In 7 months Army Melia Jesse Sans, MD [facility], [facility]

## 2020-01-04 NOTE — Telephone Encounter (Signed)
Called pt told her that she does not have any health problems that would be ok to excuse her from jury duty. Pt verbalized understanding.  KP

## 2020-01-14 LAB — HM DIABETES EYE EXAM

## 2020-02-05 ENCOUNTER — Other Ambulatory Visit: Payer: Self-pay | Admitting: Internal Medicine

## 2020-02-05 DIAGNOSIS — E785 Hyperlipidemia, unspecified: Secondary | ICD-10-CM

## 2020-02-05 DIAGNOSIS — G4700 Insomnia, unspecified: Secondary | ICD-10-CM

## 2020-02-05 DIAGNOSIS — E1140 Type 2 diabetes mellitus with diabetic neuropathy, unspecified: Secondary | ICD-10-CM

## 2020-02-05 NOTE — Telephone Encounter (Signed)
Requested Prescriptions  Pending Prescriptions Disp Refills  . atorvastatin (LIPITOR) 80 MG tablet [Pharmacy Med Name: Atorvastatin Calcium 80 MG Oral Tablet] 30 tablet 0    Sig: TAKE 1 TABLET BY MOUTH  DAILY     Cardiovascular:  Antilipid - Statins Failed - 02/05/2020 12:51 PM      Failed - Total Cholesterol in normal range and within 360 days    Cholesterol, Total  Date Value Ref Range Status  08/20/2018 115 100 - 199 mg/dL Final         Failed - LDL in normal range and within 360 days    LDL Calculated  Date Value Ref Range Status  08/20/2018 38 0 - 99 mg/dL Final         Failed - HDL in normal range and within 360 days    HDL  Date Value Ref Range Status  08/20/2018 41 >39 mg/dL Final         Failed - Triglycerides in normal range and within 360 days    Triglycerides  Date Value Ref Range Status  08/20/2018 182 (H) 0 - 149 mg/dL Final         Passed - Patient is not pregnant      Passed - Valid encounter within last 12 months    Recent Outpatient Visits          3 months ago Hearing loss of right ear due to cerumen impaction   [place], [place]   5 months ago Annual physical exam   [facility] Glean Hess, MD   9 months ago Dermatitis   [facility] Glean Hess, MD   11 months ago Essential hypertension   [facility] Glean Hess, MD   1 year ago Annual physical exam   [facility] Glean Hess, MD      Future Appointments            In 2 weeks Army Melia Jesse Sans, MD [facility], [place]   In 6 months Army Melia, Jesse Sans, MD [facility], [place]           . traZODone (DESYREL) 50 MG tablet [Pharmacy Med Name: traZODone HCl 50 MG Oral Tablet] 30 tablet 0    Sig: TAKE 1 TABLET BY MOUTH AT  BEDTIME     Psychiatry: Antidepressants - Serotonin Modulator Passed - 02/05/2020 12:51 PM      Passed - Valid encounter within last 6 months    Recent Outpatient Visits           3 months ago Hearing loss of right ear due to cerumen impaction   [facility] Glean Hess, MD   5 months ago Annual physical exam   [facility] Glean Hess, MD   9 months ago Dermatitis   [facility] Glean Hess, MD   11 months ago Essential hypertension   [facility] Glean Hess, MD   1 year ago Annual physical exam   [facility] Glean Hess, MD      Future Appointments            In 2 weeks Glean Hess, MD [facility], [place]   In 6 months Army Melia Jesse Sans, MD [facility], [facility]           . metFORMIN (GLUCOPHAGE) 1000 MG tablet [Pharmacy Med Name: metFORMIN HCl 1000 MG Oral Tablet]  180 tablet 0    Sig: TAKE 1 TABLET BY MOUTH  TWICE DAILY WITH MEALS     Endocrinology:  Diabetes - Biguanides Failed - 02/05/2020 12:51 PM      Failed - Cr in normal range and within 360 days    Creatinine, Ser  Date Value Ref Range Status  08/23/2019 1.08 (H) 0.44 - 1.00 mg/dL Final         Failed - eGFR in normal range and within 360 days    GFR calc Af Amer  Date Value Ref Range Status  08/23/2019 >60 >60 mL/min Final   GFR calc non Af Amer  Date Value Ref Range Status  08/23/2019 53 (L) >60 mL/min Final         Passed - HBA1C is between 0 and 7.9 and within 180 days    Hemoglobin A1C  Date Value Ref Range Status  04/26/2019 6.7  Final   Hgb A1c MFr Bld  Date Value Ref Range Status  08/23/2019 7.4 (H) 4.8 - 5.6 % Final    Comment:    (NOTE) Pre diabetes:          5.7%-6.4%  Diabetes:              >6.4%  Glycemic control for   <7.0% adults with diabetes          Passed - Valid encounter within last 6 months    Recent Outpatient Visits          3 months ago Hearing loss of right ear due to cerumen impaction   [facility] Glean Hess, MD   5 months ago Annual physical exam   [facility] Glean Hess, MD   9 months ago Dermatitis    [facility] Glean Hess, MD   11 months ago Essential hypertension   [facility] Glean Hess, MD   1 year ago Annual physical exam   [facility] Glean Hess, MD      Future Appointments            In 2 weeks Army Melia Jesse Sans, MD [facility], [place]   In 6 months Army Melia, Jesse Sans, MD [facility], [facility]

## 2020-02-07 ENCOUNTER — Other Ambulatory Visit: Payer: Self-pay

## 2020-02-07 DIAGNOSIS — J309 Allergic rhinitis, unspecified: Secondary | ICD-10-CM

## 2020-02-07 MED ORDER — MONTELUKAST SODIUM 10 MG PO TABS: 10.0000 mg | ORAL_TABLET | Freq: Every day | ORAL | 0 refills | Status: DC

## 2020-02-09 ENCOUNTER — Other Ambulatory Visit: Payer: Self-pay | Admitting: Internal Medicine

## 2020-02-09 DIAGNOSIS — E785 Hyperlipidemia, unspecified: Secondary | ICD-10-CM

## 2020-02-09 DIAGNOSIS — E1140 Type 2 diabetes mellitus with diabetic neuropathy, unspecified: Secondary | ICD-10-CM

## 2020-02-09 DIAGNOSIS — G4700 Insomnia, unspecified: Secondary | ICD-10-CM

## 2020-02-23 ENCOUNTER — Ambulatory Visit (INDEPENDENT_AMBULATORY_CARE_PROVIDER_SITE_OTHER): Payer: Medicare Other | Admitting: Internal Medicine

## 2020-02-23 ENCOUNTER — Encounter: Payer: Self-pay | Admitting: Internal Medicine

## 2020-02-23 ENCOUNTER — Telehealth: Payer: Self-pay

## 2020-02-23 ENCOUNTER — Other Ambulatory Visit: Payer: Self-pay

## 2020-02-23 VITALS — BP 104/70 | HR 105 | Ht 60.0 in | Wt 141.0 lb

## 2020-02-23 DIAGNOSIS — E118 Type 2 diabetes mellitus with unspecified complications: Secondary | ICD-10-CM | POA: Diagnosis not present

## 2020-02-23 DIAGNOSIS — E1169 Type 2 diabetes mellitus with other specified complication: Secondary | ICD-10-CM | POA: Diagnosis not present

## 2020-02-23 DIAGNOSIS — E785 Hyperlipidemia, unspecified: Secondary | ICD-10-CM

## 2020-02-23 DIAGNOSIS — D485 Neoplasm of uncertain behavior of skin: Secondary | ICD-10-CM

## 2020-02-23 DIAGNOSIS — I10 Essential (primary) hypertension: Secondary | ICD-10-CM | POA: Diagnosis not present

## 2020-02-23 DIAGNOSIS — G4700 Insomnia, unspecified: Secondary | ICD-10-CM | POA: Diagnosis not present

## 2020-02-23 MED ORDER — GLIMEPIRIDE 2 MG PO TABS: 2.0000 mg | ORAL_TABLET | Freq: Every day | ORAL | 1 refills | Status: DC

## 2020-02-23 MED ORDER — TRAZODONE HCL 50 MG PO TABS: 50.0000 mg | ORAL_TABLET | Freq: Every day | ORAL | 1 refills | Status: DC

## 2020-02-23 MED ORDER — LOSARTAN POTASSIUM 50 MG PO TABS: 50.0000 mg | ORAL_TABLET | Freq: Every day | ORAL | 1 refills | Status: DC

## 2020-02-23 NOTE — Progress Notes (Signed)
Date:  02/23/2020   Name:  Elizabeth Klein   DOB:  [birth year]   MRN:  [id number]   Chief Complaint: Hypertension and Diabetes  Diabetes She presents for her follow-up diabetic visit. She has type 2 diabetes mellitus. Her disease course has been stable. Pertinent negatives for hypoglycemia include no headaches or tremors. Pertinent negatives for diabetes include no chest pain, no fatigue, no polydipsia and no polyuria. There are no hypoglycemic complications. Symptoms are stable. There are no diabetic complications. Pertinent negatives for diabetic complications include no PVD. Current diabetic treatments: ozempic, metformin, Lantus and glimepiride. She is compliant with treatment all of the time. Her weight is decreasing steadily. She participates in exercise intermittently. An ACE inhibitor/angiotensin II receptor blocker is being taken. Eye exam is not current.  Hypertension This is a chronic problem. The problem is controlled. Pertinent negatives include no chest pain, headaches, palpitations or shortness of breath. Past treatments include beta blockers and angiotensin blockers. The current treatment provides significant improvement. There are no compliance problems.  Hypertensive end-organ damage includes kidney disease. There is no history of CAD/MI or PVD.  Hyperlipidemia This is a chronic problem. The problem is controlled. Pertinent negatives include no chest pain or shortness of breath. Current antihyperlipidemic treatment includes statins. The current treatment provides significant improvement of lipids.    Lab Results  Component Value Date   CREATININE 1.08 (H) 08/23/2019   BUN 25 (H) 08/23/2019   NA 138 08/23/2019   K 4.8 08/23/2019   CL 108 08/23/2019   CO2 23 08/23/2019   Lab Results  Component Value Date   CHOL 111 09/06/2019   HDL 44 09/06/2019   LDLCALC 49 09/06/2019   TRIG 91 09/06/2019   CHOLHDL 2.8 08/20/2018   Lab Results  Component Value Date   TSH 1.271  08/23/2019   Lab Results  Component Value Date   HGBA1C 6.6 12/20/2019   Lab Results  Component Value Date   WBC 10.7 (H) 08/23/2019   HGB 11.7 (L) 08/23/2019   HCT 36.8 08/23/2019   MCV 91.5 08/23/2019   PLT 306 08/23/2019   Lab Results  Component Value Date   ALT 28 08/23/2019   AST 23 08/23/2019   ALKPHOS 83 08/23/2019   BILITOT 0.6 08/23/2019     Review of Systems  Constitutional: Negative for appetite change, fatigue, fever and unexpected weight change.  HENT: Negative for tinnitus and trouble swallowing.   Eyes: Negative for visual disturbance.  Respiratory: Negative for cough, chest tightness and shortness of breath.   Cardiovascular: Negative for chest pain, palpitations and leg swelling.  Gastrointestinal: Negative for abdominal pain.  Endocrine: Negative for polydipsia and polyuria.  Genitourinary: Negative for dysuria and hematuria.  Musculoskeletal: Negative for arthralgias.  Neurological: Negative for tremors, numbness and headaches.  Psychiatric/Behavioral: Negative for dysphoric mood.    Patient Active Problem List   Diagnosis Date Noted  . Oropharyngeal dysphagia 02/23/2019  . 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 Finger injury, left, sequela 08/20/2018  . Microalbuminuria 04/30/2017  . CKD (chronic kidney disease) stage 3, GFR 30-59 ml/min (HCC) 04/30/2017  . Type II diabetes mellitus with complication (HCC) 03/19/2017  . Essential hypertension 03/19/2017  . CAD (coronary artery disease) 03/19/2017  . History of kidney stones 03/19/2017  . Psoriasis 03/19/2017  . Hyperlipidemia associated with type 2 diabetes mellitus (HCC) 03/19/2017  . Nocturnal leg cramps 03/19/2017  . Gait instability 03/19/2017  . Insomnia 03/19/2017  . Allergic rhinitis  03/19/2017    Allergies  Allergen Reactions  . Prednisone Other (See Comments)    Intolerance  - confusion     Past Surgical History:  Procedure Laterality Date  . BREAST  BIOPSY Right    neg  . CATARACT EXTRACTION W/PHACO Left 09/27/2019   Procedure: CATARACT EXTRACTION PHACO AND INTRAOCULAR LENS PLACEMENT (IOC) LEFT DIABETIC ISTENT INJ INTRAVITREAL KENALOG INJ;  Surgeon: Eulogio Bear, MD;  Location: [facility];  Service: Ophthalmology;  Laterality: Left;  2.83 0:28.7  . CATARACT EXTRACTION W/PHACO Right 12/13/2019   Procedure: CATARACT EXTRACTION PHACO AND INTRAOCULAR LENS PLACEMENT ([place]) RIGHT ISTENT INJ KENALOG INJ DIABETIC;  Surgeon: Eulogio Bear, MD;  Location: [facility];  Service: Ophthalmology;  Laterality: Right;  1.39 0:21.5  . CESAREAN SECTION      Social History   Tobacco Use  . Smoking status: Never Smoker  . Smokeless tobacco: Never Used  Vaping Use  . Vaping Use: Never used  Substance Use Topics  . Alcohol use: No  . Drug use: No     Medication list has been reviewed and updated.  Current Meds  Medication Sig  . acetaminophen (TYLENOL) 500 MG tablet Take 1,000 mg by mouth every 8 (eight) hours as needed.  Marland Kitchen aspirin EC 81 MG tablet Take 1 tablet (81 mg total) by mouth daily.  Marland Kitchen atorvastatin (LIPITOR) 80 MG tablet TAKE 1 TABLET BY MOUTH  DAILY  . BD INSULIN SYRINGE U/F 30G X 1/2" 0.5 ML MISC USE WITH LANTUS INJECTIONS  FOR DM2  . DUOBRII 0.01-0.045 % LOTN APPLY 1 APPLICATION  TOPICALLY 2 (TWO) TIMES  DAILY.  Marland Kitchen gabapentin (NEURONTIN) 300 MG capsule Take 1 capsule by mouth in the morning, at noon, and at bedtime.  Marland Kitchen glimepiride (AMARYL) 2 MG tablet Take 1 tablet (2 mg total) by mouth daily with breakfast.  . glucose blood (ACCU-CHEK SMARTVIEW) test strip USE 3 TIMES DAILY  . insulin glargine (LANTUS) 100 UNIT/ML injection Inject 0.4 mLs (40 Units total) into the skin every morning. Pt taking QAM (Patient taking differently: Inject 26 Units into the skin every morning. Pt taking QAM)  . losartan (COZAAR) 50 MG tablet TAKE 1 TABLET BY MOUTH  DAILY (Patient taking differently: To protect kidneys)  . metFORMIN  (GLUCOPHAGE) 1000 MG tablet TAKE 1 TABLET BY MOUTH  TWICE DAILY WITH MEALS  . metoprolol tartrate (LOPRESSOR) 50 MG tablet Take 1 tablet (50 mg total) by mouth 2 (two) times daily.  . montelukast (SINGULAIR) 10 MG tablet Take 1 tablet (10 mg total) by mouth daily.  Marland Kitchen omeprazole (PRILOSEC) 20 MG capsule Take 1 capsule (20 mg total) by mouth daily.  Glory Rosebush DELICA PLUS LANCETS MISC 1 each by Does not apply route 2 (two) times daily.  . Semaglutide, 1 MG/DOSE, (OZEMPIC, 1 MG/DOSE,) 4 MG/3ML SOPN Inject into the skin. Inject 1 mg into the skin every 7 days  . Syringe, Disposable, (B-D 30CC SYRINGE) 30 ML MISC Use with Lantus Injections for DM2 ICD 10 E11.9  . traZODone (DESYREL) 50 MG tablet TAKE 1 TABLET BY MOUTH AT  BEDTIME    PHQ 2/9 Scores 02/23/2020 10/13/2019 08/23/2019 05/05/2019  PHQ - 2 Score 0 0 0 0  PHQ- 9 Score 5 - 4 0    GAD 7 : Generalized Anxiety Score 02/23/2020 08/23/2019 05/05/2019  Nervous, Anxious, on Edge 0 1 0  Control/stop worrying 0 0 1  Worry too much - different things 0 0 1  Trouble relaxing 0  0 0  Restless 0 0 0  Easily annoyed or irritable 0 1 0  Afraid - awful might happen 0 0 0  Total GAD 7 Score 0 2 2  Anxiety Difficulty Not difficult at all Not difficult at all Not difficult at all    BP Readings from Last 3 Encounters:  02/23/20 104/70  12/13/19 123/65  10/18/19 108/74    Physical Exam Vitals and nursing note reviewed.  Constitutional:      General: She is not in acute distress.    Appearance: She is well-developed.  HENT:     Head: Normocephalic and atraumatic.  Cardiovascular:     Rate and Rhythm: Normal rate and regular rhythm.     Pulses: Normal pulses.     Heart sounds: No murmur heard.   Pulmonary:     Effort: Pulmonary effort is normal. No respiratory distress.     Breath sounds: No wheezing or rhonchi.  Musculoskeletal:     Cervical back: Normal range of motion.     Right lower leg: No edema.     Left lower leg: No edema.   Lymphadenopathy:     Cervical: No cervical adenopathy.  Skin:    General: Skin is warm and dry.     Findings: Lesion present. No rash.          Comments: Flat dark lesion - slowly enlarging  Neurological:     General: No focal deficit present.     Mental Status: She is alert and oriented to person, place, and time.  Psychiatric:        Attention and Perception: Attention normal.        Mood and Affect: Mood and affect and mood normal.     Wt Readings from Last 3 Encounters:  02/23/20 141 lb (64 kg)  12/13/19 135 lb (61.2 kg)  10/18/19 137 lb (62.1 kg)    BP 104/70   Pulse (!) 105   Ht 5' (1.524 m)   Wt 141 lb (64 kg)   SpO2 97%   BMI 27.54 kg/m   Assessment and Plan: 1. Essential hypertension Clinically stable exam with well controlled BP on losartan. Tolerating medications without side effects at this time. Pt to continue current regimen and low sodium diet; benefits of regular exercise as able discussed.  2. Type II diabetes mellitus with complication ([place]) Clinically stable by exam and report without s/s of hypoglycemia. DM complicated by microalbuminuria. Tolerating medications well without side effects or other concerns. Seeing Endocrinology for DM management - losartan (COZAAR) 50 MG tablet; Take 1 tablet (50 mg total) by mouth daily.  Dispense: 90 tablet; Refill: 1 - glimepiride (AMARYL) 2 MG tablet; Take 1 tablet (2 mg total) by mouth daily with breakfast.  Dispense: 90 tablet; Refill: 1  3. Hyperlipidemia associated with type 2 diabetes mellitus ([place]) Tolerating statin medication without side effects at this time LDL is at goal of < 70 on current dose Continue same therapy without change at this time.  4. Neoplasm of uncertain behavior of skin Dark macule on foot - pt believes that it is enlarging Recommend dermatology evaluation  5. Insomnia, unspecified type Continue trazodone PRN - traZODone (DESYREL) 50 MG tablet; Take 1 tablet (50 mg total) by  mouth at bedtime.  Dispense: 90 tablet; Refill: 1   Partially dictated using Editor, commissioning. Any errors are unintentional.  Halina Maidens, MD [facility]  02/23/2020

## 2020-02-23 NOTE — Telephone Encounter (Signed)
[facility] and requested last eye exam to be faxed over for this patient from Dr. Willey Blade. They said they will send over her last eye exam from this past November.

## 2020-02-23 NOTE — Patient Instructions (Signed)
Consult Dermatology for lesion on your foot.

## 2020-03-09 ENCOUNTER — Other Ambulatory Visit: Payer: Self-pay | Admitting: Internal Medicine

## 2020-03-09 DIAGNOSIS — E785 Hyperlipidemia, unspecified: Secondary | ICD-10-CM

## 2020-03-13 ENCOUNTER — Other Ambulatory Visit: Payer: Self-pay | Admitting: Internal Medicine

## 2020-03-13 DIAGNOSIS — J309 Allergic rhinitis, unspecified: Secondary | ICD-10-CM

## 2020-03-27 ENCOUNTER — Other Ambulatory Visit: Payer: Self-pay | Admitting: Internal Medicine

## 2020-03-27 DIAGNOSIS — E1165 Type 2 diabetes mellitus with hyperglycemia: Secondary | ICD-10-CM

## 2020-03-27 DIAGNOSIS — IMO0002 Reserved for concepts with insufficient information to code with codable children: Secondary | ICD-10-CM

## 2020-03-29 ENCOUNTER — Ambulatory Visit: Payer: Self-pay | Admitting: Internal Medicine

## 2020-03-29 NOTE — Telephone Encounter (Signed)
Patient scheduled 04/03/2020

## 2020-03-29 NOTE — Telephone Encounter (Signed)
Pt reports BS 57 last night .  States took oral meds that evening, BS 105. After meds but before eating dinner,57. States drank milk and "Glucose drink" BS up to 133. Presently 127.  States symptomatic at 67, "Cold, sweating." Pt reports "This happens 2-3 times a year."  Next available appt with Dr. Army Melia 04/03/20 'Same Day' Called practice, spoke with Estill Bamberg; pt scheduled 04/03/20, 1600. Care advise given per protocol.  Advised ED/UC if occurs again, remains symptomatic.  Pt verbalizes understanding.  Reason for Disposition . [1] Blood glucose < 70  mg/dL (3.9 mmol/L) or symptomatic, now improved with Care Advice AND [2] cause unknown  Answer Assessment - Initial Assessment Questions 1. SYMPTOMS: "What symptoms are you concerned about?"     BS 57 last night 2. ONSET:  "When did the symptoms start?"     Sweating, cold last night 3. BLOOD GLUCOSE: "What is your blood glucose level?"     Presently 127 4. USUAL RANGE: "What is your blood glucose level usually?" (e.g., usual fasting morning value, usual evening value)     *No Answer* 5. TYPE 1 or 2:  "Do you know what type of diabetes you have?"  (e.g., Type 1, Type 2, Gestational; doesn't know)      120's-130's 6. INSULIN: "Do you take insulin?" "What type of insulin(s) do you use? What is the mode of delivery? (syringe, pen; injection or pump) "When did you last give yourself an insulin dose?" (i.e., time or hours/minutes ago) "How much did you give?" (i.e., how many units)     Yes. Did not take this AM  7. DIABETES PILLS: "Do you take any pills for your diabetes?"     Yes. Took oral meds this am , not insulin 8. OTHER SYMPTOMS: "Do you have any symptoms?" (e.g., fever, frequent urination, difficulty breathing, vomiting)     Not precently 9. LOW BLOOD GLUCOSE TREATMENT: "What have you done so far to treat the low blood glucose level?"     *Milk, "Glucose drink." 10. FOOD: "When did you last eat or drink?"       This am 11. ALONE: "Are  you alone right now or is someone with you?"        With husband.  Protocols used: DIABETES - LOW BLOOD SUGAR-A-AH

## 2020-03-30 DIAGNOSIS — I208 Other forms of angina pectoris: Secondary | ICD-10-CM | POA: Diagnosis not present

## 2020-03-30 DIAGNOSIS — I2510 Atherosclerotic heart disease of native coronary artery without angina pectoris: Secondary | ICD-10-CM | POA: Diagnosis not present

## 2020-03-30 DIAGNOSIS — N183 Chronic kidney disease, stage 3 unspecified: Secondary | ICD-10-CM | POA: Diagnosis not present

## 2020-03-30 DIAGNOSIS — R0609 Other forms of dyspnea: Secondary | ICD-10-CM | POA: Diagnosis not present

## 2020-03-30 DIAGNOSIS — R002 Palpitations: Secondary | ICD-10-CM | POA: Diagnosis not present

## 2020-03-30 DIAGNOSIS — E1142 Type 2 diabetes mellitus with diabetic polyneuropathy: Secondary | ICD-10-CM | POA: Diagnosis not present

## 2020-03-30 DIAGNOSIS — E782 Mixed hyperlipidemia: Secondary | ICD-10-CM | POA: Diagnosis not present

## 2020-03-30 DIAGNOSIS — Z794 Long term (current) use of insulin: Secondary | ICD-10-CM | POA: Diagnosis not present

## 2020-03-30 DIAGNOSIS — I10 Essential (primary) hypertension: Secondary | ICD-10-CM | POA: Diagnosis not present

## 2020-04-03 ENCOUNTER — Other Ambulatory Visit: Payer: Self-pay

## 2020-04-03 ENCOUNTER — Encounter: Payer: Self-pay | Admitting: Internal Medicine

## 2020-04-03 ENCOUNTER — Ambulatory Visit (INDEPENDENT_AMBULATORY_CARE_PROVIDER_SITE_OTHER): Payer: Medicare Other | Admitting: Internal Medicine

## 2020-04-03 VITALS — BP 104/72 | HR 90 | Temp 98.0°F | Ht 60.0 in | Wt 140.0 lb

## 2020-04-03 DIAGNOSIS — E118 Type 2 diabetes mellitus with unspecified complications: Secondary | ICD-10-CM | POA: Diagnosis not present

## 2020-04-03 DIAGNOSIS — I10 Essential (primary) hypertension: Secondary | ICD-10-CM | POA: Diagnosis not present

## 2020-04-03 LAB — POCT GLYCOSYLATED HEMOGLOBIN (HGB A1C): Hemoglobin A1C: 6.6 % — AB (ref 4.0–5.6)

## 2020-04-03 NOTE — Progress Notes (Signed)
Date:  04/03/2020   Name:  Elizabeth Klein   DOB:  [birth year]   MRN:  [id number]   Chief Complaint: Hypoglycemia (X 1 week,Checked it in the middle of the night 57, night sweats, chivering)  Diabetes She presents for her follow-up diabetic visit. She has type 2 diabetes mellitus. Hypoglycemia symptoms include sweats. Pertinent negatives for hypoglycemia include no dizziness or headaches. (One episode of BS 57 - responded to oral glucose solution) Pertinent negatives for diabetes include no chest pain and no fatigue. Hypoglycemia complications include nocturnal hypoglycemia. Current diabetic treatment includes insulin injections (metformin, rybelsus and glimepiride). She is compliant with treatment all of the time.    Lab Results  Component Value Date   CREATININE 1.08 (H) 08/23/2019   BUN 25 (H) 08/23/2019   NA 138 08/23/2019   K 4.8 08/23/2019   CL 108 08/23/2019   CO2 23 08/23/2019   Lab Results  Component Value Date   CHOL 111 09/06/2019   HDL 44 09/06/2019   LDLCALC 49 09/06/2019   TRIG 91 09/06/2019   CHOLHDL 2.8 08/20/2018   Lab Results  Component Value Date   TSH 1.271 08/23/2019   Lab Results  Component Value Date   HGBA1C 6.6 12/20/2019   Lab Results  Component Value Date   WBC 10.7 (H) 08/23/2019   HGB 11.7 (L) 08/23/2019   HCT 36.8 08/23/2019   MCV 91.5 08/23/2019   PLT 306 08/23/2019   Lab Results  Component Value Date   ALT 28 08/23/2019   AST 23 08/23/2019   ALKPHOS 83 08/23/2019   BILITOT 0.6 08/23/2019     Review of Systems  Constitutional: Negative for chills, fatigue and fever.  Respiratory: Negative for chest tightness and shortness of breath.   Cardiovascular: Negative for chest pain and leg swelling.  Neurological: Negative for dizziness and headaches.    Patient Active Problem List   Diagnosis Date Noted  . Oropharyngeal dysphagia 02/23/2019  . 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 Finger injury, left,  sequela 08/20/2018  . Microalbuminuria 04/30/2017  . CKD (chronic kidney disease) stage 3, GFR 30-59 ml/min (HCC) 04/30/2017  . Type II diabetes mellitus with complication ([place]) 23/53/6144  . Essential hypertension 03/19/2017  . CAD (coronary artery disease) 03/19/2017  . History of kidney stones 03/19/2017  . Psoriasis 03/19/2017  . Hyperlipidemia associated with type 2 diabetes mellitus ([place]) 03/19/2017  . Nocturnal leg cramps 03/19/2017  . Gait instability 03/19/2017  . Insomnia 03/19/2017  . Allergic rhinitis 03/19/2017    Allergies  Allergen Reactions  . Prednisone Other (See Comments)    Intolerance  - confusion     Past Surgical History:  Procedure Laterality Date  . BREAST BIOPSY Right    neg  . CATARACT EXTRACTION W/PHACO Left 09/27/2019   Procedure: CATARACT EXTRACTION PHACO AND INTRAOCULAR LENS PLACEMENT (IOC) LEFT DIABETIC ISTENT INJ INTRAVITREAL KENALOG INJ;  Surgeon: Eulogio Bear, MD;  Location: [facility];  Service: Ophthalmology;  Laterality: Left;  2.83 0:28.7  . CATARACT EXTRACTION W/PHACO Right 12/13/2019   Procedure: CATARACT EXTRACTION PHACO AND INTRAOCULAR LENS PLACEMENT ([place]) RIGHT ISTENT INJ KENALOG INJ DIABETIC;  Surgeon: Eulogio Bear, MD;  Location: [facility];  Service: Ophthalmology;  Laterality: Right;  1.39 0:21.5  . CESAREAN SECTION      Social History   Tobacco Use  . Smoking status: Never Smoker  . Smokeless tobacco: Never Used  Vaping Use  . Vaping Use: Never  used  Substance Use Topics  . Alcohol use: No  . Drug use: No     Medication list has been reviewed and updated.  Current Meds  Medication Sig  . acetaminophen (TYLENOL) 500 MG tablet Take 1,000 mg by mouth every 8 (eight) hours as needed.  . Ascorbic Acid (VITAMIN C PO) Take by mouth.  Marland Kitchen aspirin EC 81 MG tablet Take 1 tablet (81 mg total) by mouth daily.  Marland Kitchen atorvastatin (LIPITOR) 80 MG tablet TAKE 1 TABLET BY MOUTH  DAILY  . BD INSULIN SYRINGE  U/F 30G X 1/2" 0.5 ML MISC USE WITH LANTUS INJECTIONS  FOR DM2  . Calcipotriene-Betameth Diprop (ENSTILAR) 0.005-0.064 % FOAM Apply topically.  . clobetasol cream (TEMOVATE) 4.74 % Apply 1 application topically 2 (two) times daily.  . DUOBRII 0.01-0.045 % LOTN APPLY 1 APPLICATION  TOPICALLY 2 (TWO) TIMES  DAILY.  . fluocinonide ointment (LIDEX) 2.59 % Apply 1 application topically 2 (two) times daily.  Marland Kitchen glimepiride (AMARYL) 2 MG tablet Take 1 tablet (2 mg total) by mouth daily with breakfast.  . glucose blood (ACCU-CHEK SMARTVIEW) test strip USE 3 TIMES DAILY  . insulin glargine (LANTUS) 100 UNIT/ML injection Inject 0.4 mLs (40 Units total) into the skin every morning. Pt taking QAM (Patient taking differently: Inject 26 Units into the skin every morning. Pt taking QAM)  . losartan (COZAAR) 50 MG tablet Take 1 tablet (50 mg total) by mouth daily.  . metFORMIN (GLUCOPHAGE) 1000 MG tablet TAKE 1 TABLET BY MOUTH  TWICE DAILY WITH MEALS  . metoprolol tartrate (LOPRESSOR) 50 MG tablet Take 1 tablet (50 mg total) by mouth 2 (two) times daily.  . montelukast (SINGULAIR) 10 MG tablet TAKE 1 TABLET BY MOUTH  DAILY  . omeprazole (PRILOSEC) 20 MG capsule Take 1 capsule (20 mg total) by mouth daily.  . ondansetron (ZOFRAN-ODT) 4 MG disintegrating tablet Take 4 mg by mouth every 8 (eight) hours as needed for nausea or vomiting.  Glory Rosebush DELICA PLUS LANCETS MISC 1 each by Does not apply route 2 (two) times daily.  . Semaglutide, 1 MG/DOSE, (OZEMPIC, 1 MG/DOSE,) 4 MG/3ML SOPN Inject into the skin. Inject 1 mg into the skin every 7 days  . Syringe, Disposable, (B-D 30CC SYRINGE) 30 ML MISC Use with Lantus Injections for DM2 ICD 10 E11.9  . traZODone (DESYREL) 50 MG tablet Take 1 tablet (50 mg total) by mouth at bedtime.    PHQ 2/9 Scores 04/03/2020 02/23/2020 10/13/2019 08/23/2019  PHQ - 2 Score 0 0 0 0  PHQ- 9 Score 1 5 - 4    GAD 7 : Generalized Anxiety Score 04/03/2020 02/23/2020 08/23/2019 05/05/2019   Nervous, Anxious, on Edge 0 0 1 0  Control/stop worrying 1 0 0 1  Worry too much - different things 1 0 0 1  Trouble relaxing 0 0 0 0  Restless 0 0 0 0  Easily annoyed or irritable 0 0 1 0  Afraid - awful might happen 0 0 0 0  Total GAD 7 Score 2 0 2 2  Anxiety Difficulty - Not difficult at all Not difficult at all Not difficult at all    BP Readings from Last 3 Encounters:  04/03/20 104/72  02/23/20 104/70  12/13/19 123/65    Physical Exam Vitals and nursing note reviewed.  Constitutional:      General: She is not in acute distress.    Appearance: Normal appearance. She is well-developed.  HENT:     Head: Normocephalic  and atraumatic.  Cardiovascular:     Rate and Rhythm: Normal rate and regular rhythm.     Pulses: Normal pulses.  Pulmonary:     Effort: Pulmonary effort is normal. No respiratory distress.     Breath sounds: No wheezing or rhonchi.  Musculoskeletal:     Cervical back: Normal range of motion.     Right lower leg: No edema.     Left lower leg: No edema.  Lymphadenopathy:     Cervical: No cervical adenopathy.  Skin:    General: Skin is warm and dry.     Findings: No rash.  Neurological:     General: No focal deficit present.     Mental Status: She is alert and oriented to person, place, and time.  Psychiatric:        Mood and Affect: Mood and affect and mood normal.        Behavior: Behavior normal.     Wt Readings from Last 3 Encounters:  04/03/20 140 lb (63.5 kg)  02/23/20 141 lb (64 kg)  12/13/19 135 lb (61.2 kg)    BP 104/72   Pulse (!) 108   Temp 98 F (36.7 C) (Oral)   Ht 5' (1.524 m)   Wt 140 lb (63.5 kg)   SpO2 99%   BMI 27.34 kg/m   Assessment and Plan: 1. Type II diabetes mellitus with complication (HCC) One episode of low blood sugar with sx that did not require EMS  Recommend discontinuing glimepiride Continue other medication Follow up with Endo as planned later this month A1C today 6.6  2. Essential  hypertension Clinically stable exam with well controlled BP. Tolerating medications without side effects at this time. Pt to continue current regimen and low sodium diet; benefits of regular exercise as able discussed.     Partially dictated using Editor, commissioning. Any errors are unintentional.  Halina Maidens, MD [facility]  04/03/2020

## 2020-04-03 NOTE — Patient Instructions (Signed)
Stop glimepiride.  See Dr. Honor Junes as planned.

## 2020-04-11 ENCOUNTER — Other Ambulatory Visit: Payer: Self-pay | Admitting: Internal Medicine

## 2020-04-11 DIAGNOSIS — E1140 Type 2 diabetes mellitus with diabetic neuropathy, unspecified: Secondary | ICD-10-CM

## 2020-04-20 DIAGNOSIS — E113212 Type 2 diabetes mellitus with mild nonproliferative diabetic retinopathy with macular edema, left eye: Secondary | ICD-10-CM | POA: Diagnosis not present

## 2020-04-24 DIAGNOSIS — E1142 Type 2 diabetes mellitus with diabetic polyneuropathy: Secondary | ICD-10-CM | POA: Diagnosis not present

## 2020-04-24 DIAGNOSIS — E1169 Type 2 diabetes mellitus with other specified complication: Secondary | ICD-10-CM | POA: Diagnosis not present

## 2020-04-24 DIAGNOSIS — E1159 Type 2 diabetes mellitus with other circulatory complications: Secondary | ICD-10-CM | POA: Diagnosis not present

## 2020-04-24 DIAGNOSIS — Z794 Long term (current) use of insulin: Secondary | ICD-10-CM | POA: Diagnosis not present

## 2020-04-24 DIAGNOSIS — I152 Hypertension secondary to endocrine disorders: Secondary | ICD-10-CM | POA: Diagnosis not present

## 2020-04-24 DIAGNOSIS — E785 Hyperlipidemia, unspecified: Secondary | ICD-10-CM | POA: Diagnosis not present

## 2020-06-02 ENCOUNTER — Other Ambulatory Visit: Payer: Self-pay | Admitting: Internal Medicine

## 2020-06-02 DIAGNOSIS — E785 Hyperlipidemia, unspecified: Secondary | ICD-10-CM

## 2020-06-02 NOTE — Telephone Encounter (Signed)
Requested Prescriptions  Pending Prescriptions Disp Refills  . atorvastatin (LIPITOR) 80 MG tablet [Pharmacy Med Name: Atorvastatin Calcium 80 MG Oral Tablet] 90 tablet 0    Sig: TAKE 1 TABLET BY MOUTH  DAILY     Cardiovascular:  Antilipid - Statins Passed - 06/02/2020  9:04 PM      Passed - Total Cholesterol in normal range and within 360 days    Cholesterol, Total  Date Value Ref Range Status  08/20/2018 115 100 - 199 mg/dL Final   Cholesterol  Date Value Ref Range Status  09/06/2019 111 0 - 200 Final         Passed - LDL in normal range and within 360 days    LDL Calculated  Date Value Ref Range Status  08/20/2018 38 0 - 99 mg/dL Final   LDL Cholesterol  Date Value Ref Range Status  09/06/2019 49  Final         Passed - HDL in normal range and within 360 days    HDL  Date Value Ref Range Status  09/06/2019 44 35 - 70 Final  08/20/2018 41 >39 mg/dL Final         Passed - Triglycerides in normal range and within 360 days    Triglycerides  Date Value Ref Range Status  09/06/2019 91 40 - 160 Final         Passed - Patient is not pregnant      Passed - Valid encounter within last 12 months    Recent Outpatient Visits          2 months ago Type II diabetes mellitus with complication [facility])   [facility] Glean Hess, MD   3 months ago Essential hypertension   [facility] Glean Hess, MD   7 months ago Hearing loss of right ear due to cerumen impaction   [facility] Glean Hess, MD   9 months ago Annual physical exam   [facility] Glean Hess, MD   1 year ago Dermatitis   [facility] Glean Hess, MD      Future Appointments            In 2 months Army Melia Jesse Sans, MD [facility], [facility]

## 2020-06-07 ENCOUNTER — Ambulatory Visit: Payer: Self-pay

## 2020-06-07 NOTE — Telephone Encounter (Signed)
Patient called and says she's been having dizziness since yesterday. She says with the dizziness she had nausea on yesterday and today woke up with a spinning headache. She says she is able to walk, but feels unsteady. She denies any other symptoms and asked if she could be seen in the office today. I advised no availability today in the office, placed her on hold to call the [facility] to check on availability for tomorrow. I spoke to [place], [facility] who says the patient will be scheduled for tomorrow at 1100 with Dr. Army Melia. I advised the patient, care advice given, she verbalized understanding.  Reason for Disposition . [1] MODERATE dizziness (e.g., vertigo; feels very unsteady, interferes with normal activities) AND [2] has NOT been evaluated by physician for this  Answer Assessment - Initial Assessment Questions 1. DESCRIPTION: "Describe your dizziness."     Spinning head 2. VERTIGO: "Do you feel like either you or the room is spinning or tilting?"      Yes 3. LIGHTHEADED: "Do you feel lightheaded?" (e.g., somewhat faint, woozy, weak upon standing)     Yes 4. SEVERITY: "How bad is it?"  "Can you walk?"   - MILD: Feels unsteady but walking normally.   - MODERATE: Feels very unsteady when walking, but not falling; interferes with normal activities (e.g., school, work) .   - SEVERE: Unable to walk without falling, or requires assistance to walk without falling.     Moderate 5. ONSET:  "When did the dizziness begin?"     Yesterday 6. AGGRAVATING FACTORS: "Does anything make it worse?" (e.g., standing, change in head position)      Standing 7. CAUSE: "What do you think is causing the dizziness?"     I don't know 8. RECURRENT SYMPTOM: "Have you had dizziness before?" If Yes, ask: "When was the last time?" "What happened that time?"      No 9. OTHER SYMPTOMS: "Do you have any other symptoms?" (e.g., headache, weakness, numbness, vomiting, earache)     Headache, nausea 10. PREGNANCY: "Is there any  chance you are pregnant?" "When was your last menstrual period?"       No  Protocols used: DIZZINESS - VERTIGO-A-AH

## 2020-06-08 ENCOUNTER — Other Ambulatory Visit: Payer: Self-pay

## 2020-06-08 ENCOUNTER — Encounter: Payer: Self-pay | Admitting: Internal Medicine

## 2020-06-08 ENCOUNTER — Ambulatory Visit (INDEPENDENT_AMBULATORY_CARE_PROVIDER_SITE_OTHER): Payer: Medicare Other | Admitting: Internal Medicine

## 2020-06-08 VITALS — BP 104/72 | HR 99 | Temp 98.0°F | Ht 60.0 in | Wt 139.0 lb

## 2020-06-08 DIAGNOSIS — R42 Dizziness and giddiness: Secondary | ICD-10-CM | POA: Diagnosis not present

## 2020-06-08 DIAGNOSIS — G44209 Tension-type headache, unspecified, not intractable: Secondary | ICD-10-CM | POA: Diagnosis not present

## 2020-06-08 MED ORDER — ONDANSETRON 4 MG PO TBDP: 4.0000 mg | ORAL_TABLET | Freq: Three times a day (TID) | ORAL | 1 refills | Status: DC | PRN

## 2020-06-08 NOTE — Patient Instructions (Signed)
You can take Tylenol or Excedrin Migraine as needed for headache  Take Zofran as needed for nausea

## 2020-06-08 NOTE — Progress Notes (Signed)
Date:  06/08/2020   Name:  Elizabeth Klein   DOB:  Oct 28, 1950   MRN:  [id number]   Chief Complaint: Dizziness (X3 days, Nausea, headache, no fever ) and Shoulder Pain (X1 month, right arm, hurts to move arm )  Dizziness This is a new problem. The current episode started in the past 7 days. Associated symptoms include arthralgias, headaches and nausea. Pertinent negatives include no abdominal pain, chest pain, chills, congestion, fatigue or fever.  Shoulder Pain  The pain is present in the right shoulder. This is a new problem. The current episode started 1 to 4 weeks ago. The quality of the pain is described as aching. The pain is mild. Associated symptoms include a limited range of motion. Pertinent negatives include no fever.    Lab Results  Component Value Date   CREATININE 1.08 (H) 08/23/2019   BUN 25 (H) 08/23/2019   NA 138 08/23/2019   K 4.8 08/23/2019   CL 108 08/23/2019   CO2 23 08/23/2019   Lab Results  Component Value Date   CHOL 111 09/06/2019   HDL 44 09/06/2019   LDLCALC 49 09/06/2019   TRIG 91 09/06/2019   CHOLHDL 2.8 08/20/2018   Lab Results  Component Value Date   TSH 1.271 08/23/2019   Lab Results  Component Value Date   HGBA1C 6.6 (A) 04/03/2020   Lab Results  Component Value Date   WBC 10.7 (H) 08/23/2019   HGB 11.7 (L) 08/23/2019   HCT 36.8 08/23/2019   MCV 91.5 08/23/2019   PLT 306 08/23/2019   Lab Results  Component Value Date   ALT 28 08/23/2019   AST 23 08/23/2019   ALKPHOS 83 08/23/2019   BILITOT 0.6 08/23/2019     Review of Systems  Constitutional: Negative for chills, fatigue and fever.  HENT: Negative for congestion, sinus pressure and sinus pain.   Respiratory: Negative for chest tightness and shortness of breath.   Cardiovascular: Negative for chest pain, palpitations and leg swelling.  Gastrointestinal: Positive for nausea. Negative for abdominal pain, constipation and diarrhea.  Musculoskeletal: Positive for  arthralgias.  Neurological: Positive for dizziness and headaches.    Patient Active Problem List   Diagnosis Date Noted  . Oropharyngeal dysphagia 02/23/2019  . 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 Finger injury, left, sequela 08/20/2018  . Microalbuminuria 04/30/2017  . CKD (chronic kidney disease) stage 3, GFR 30-59 ml/min (HCC) 04/30/2017  . Type II diabetes mellitus with complication ([place]) 93/81/0175  . Essential hypertension 03/19/2017  . CAD (coronary artery disease) 03/19/2017  . History of kidney stones 03/19/2017  . Psoriasis 03/19/2017  . Hyperlipidemia associated with type 2 diabetes mellitus ([place]) 03/19/2017  . Nocturnal leg cramps 03/19/2017  . Gait instability 03/19/2017  . Insomnia 03/19/2017  . Allergic rhinitis 03/19/2017    Allergies  Allergen Reactions  . Prednisone Other (See Comments)    Intolerance  - confusion     Past Surgical History:  Procedure Laterality Date  . BREAST BIOPSY Right    neg  . CATARACT EXTRACTION W/PHACO Left 09/27/2019   Procedure: CATARACT EXTRACTION PHACO AND INTRAOCULAR LENS PLACEMENT (IOC) LEFT DIABETIC ISTENT INJ INTRAVITREAL KENALOG INJ;  Surgeon: Eulogio Bear, MD;  Location: [facility];  Service: Ophthalmology;  Laterality: Left;  2.83 0:28.7  . CATARACT EXTRACTION W/PHACO Right 12/13/2019   Procedure: CATARACT EXTRACTION PHACO AND INTRAOCULAR LENS PLACEMENT ([place]) RIGHT ISTENT INJ KENALOG INJ DIABETIC;  Surgeon: Eulogio Bear, MD;  Location: [place];  Service: Ophthalmology;  Laterality: Right;  1.39 0:21.5  . CESAREAN SECTION      Social History   Tobacco Use  . Smoking status: Never Smoker  . Smokeless tobacco: Never Used  Vaping Use  . Vaping Use: Never used  Substance Use Topics  . Alcohol use: No  . Drug use: No     Medication list has been reviewed and updated.  Current Meds  Medication Sig  . acetaminophen (TYLENOL) 500 MG tablet Take 1,000 mg by  mouth every 8 (eight) hours as needed.  . Ascorbic Acid (VITAMIN C PO) Take by mouth.  Marland Kitchen aspirin EC 81 MG tablet Take 1 tablet (81 mg total) by mouth daily.  Marland Kitchen atorvastatin (LIPITOR) 80 MG tablet TAKE 1 TABLET BY MOUTH  DAILY  . BD INSULIN SYRINGE U/F 30G X 1/2" 0.5 ML MISC USE WITH LANTUS INJECTIONS  FOR DM2  . Calcipotriene-Betameth Diprop (ENSTILAR) 0.005-0.064 % FOAM Apply topically.  . clobetasol cream (TEMOVATE) 4.78 % Apply 1 application topically 2 (two) times daily.  . DUOBRII 0.01-0.045 % LOTN APPLY 1 APPLICATION  TOPICALLY 2 (TWO) TIMES  DAILY.  . fluocinonide ointment (LIDEX) 2.95 % Apply 1 application topically 2 (two) times daily.  Marland Kitchen glucose blood (ACCU-CHEK SMARTVIEW) test strip USE 3 TIMES DAILY  . insulin glargine (LANTUS) 100 UNIT/ML injection Inject 0.4 mLs (40 Units total) into the skin every morning. Pt taking QAM (Patient taking differently: Inject 26 Units into the skin every morning. Pt taking QAM)  . losartan (COZAAR) 50 MG tablet Take 1 tablet (50 mg total) by mouth daily.  . metFORMIN (GLUCOPHAGE) 1000 MG tablet TAKE 1 TABLET BY MOUTH  TWICE DAILY WITH MEALS  . metoprolol tartrate (LOPRESSOR) 50 MG tablet Take 1 tablet (50 mg total) by mouth 2 (two) times daily.  . montelukast (SINGULAIR) 10 MG tablet TAKE 1 TABLET BY MOUTH  DAILY  . omeprazole (PRILOSEC) 20 MG capsule Take 1 capsule (20 mg total) by mouth daily.  . ondansetron (ZOFRAN-ODT) 4 MG disintegrating tablet Take 4 mg by mouth every 8 (eight) hours as needed for nausea or vomiting.  Glory Rosebush DELICA PLUS LANCETS MISC 1 each by Does not apply route 2 (two) times daily.  . Semaglutide, 1 MG/DOSE, (OZEMPIC, 1 MG/DOSE,) 4 MG/3ML SOPN Inject into the skin. Inject 1 mg into the skin every 7 days  . Syringe, Disposable, (B-D 30CC SYRINGE) 30 ML MISC Use with Lantus Injections for DM2 ICD 10 E11.9  . traZODone (DESYREL) 50 MG tablet Take 1 tablet (50 mg total) by mouth at bedtime.    PHQ 2/9 Scores 06/08/2020  04/03/2020 02/23/2020 10/13/2019  PHQ - 2 Score 0 0 0 0  PHQ- 9 Score 2 1 5  -    GAD 7 : Generalized Anxiety Score 06/08/2020 04/03/2020 02/23/2020 08/23/2019  Nervous, Anxious, on Edge 0 0 0 1  Control/stop worrying 0 1 0 0  Worry too much - different things 0 1 0 0  Trouble relaxing 0 0 0 0  Restless 0 0 0 0  Easily annoyed or irritable 0 0 0 1  Afraid - awful might happen 0 0 0 0  Total GAD 7 Score 0 2 0 2  Anxiety Difficulty - - Not difficult at all Not difficult at all    BP Readings from Last 3 Encounters:  06/08/20 104/72  04/03/20 104/72  02/23/20 104/70    Physical Exam Vitals and nursing note reviewed.  Constitutional:  General: She is not in acute distress.    Appearance: She is well-developed.  HENT:     Head: Normocephalic and atraumatic.  Pulmonary:     Effort: Pulmonary effort is normal. No respiratory distress.  Skin:    General: Skin is warm and dry.     Findings: No rash.  Neurological:     Mental Status: She is alert and oriented to person, place, and time.     Cranial Nerves: Cranial nerves are intact.     Sensory: Sensation is intact.     Motor: Motor function is intact.     Coordination: Coordination is intact.  Psychiatric:        Mood and Affect: Mood normal.        Behavior: Behavior normal.     Wt Readings from Last 3 Encounters:  06/08/20 139 lb (63 kg)  04/03/20 140 lb (63.5 kg)  02/23/20 141 lb (64 kg)    BP 104/72   Pulse 99   Temp 98 F (36.7 C) (Oral)   Ht 5' (1.524 m)   Wt 139 lb (63 kg)   SpO2 96%   BMI 27.15 kg/m   Assessment and Plan: 1. Vertigo Mostly resolved; recommend adequate hydration and use zofran prn nausea - ondansetron (ZOFRAN-ODT) 4 MG disintegrating tablet; Take 1 tablet (4 mg total) by mouth every 8 (eight) hours as needed for nausea or vomiting.  Dispense: 30 tablet; Refill: 1  2. Tension headache Long standing and recurrent Recommend Tylenol or Excedrin migraine PRN   Partially dictated using  Editor, commissioning. Any errors are unintentional.  Halina Maidens, MD [facility]  06/08/2020

## 2020-08-23 ENCOUNTER — Other Ambulatory Visit: Payer: Self-pay

## 2020-08-23 ENCOUNTER — Encounter: Payer: Self-pay | Admitting: Internal Medicine

## 2020-08-23 ENCOUNTER — Ambulatory Visit (INDEPENDENT_AMBULATORY_CARE_PROVIDER_SITE_OTHER): Payer: Medicare Other | Admitting: Internal Medicine

## 2020-08-23 ENCOUNTER — Other Ambulatory Visit: Payer: Self-pay | Admitting: Internal Medicine

## 2020-08-23 VITALS — BP 106/78 | HR 82 | Temp 98.3°F | Ht 60.0 in | Wt 139.0 lb

## 2020-08-23 DIAGNOSIS — N183 Chronic kidney disease, stage 3 unspecified: Secondary | ICD-10-CM | POA: Diagnosis not present

## 2020-08-23 DIAGNOSIS — E118 Type 2 diabetes mellitus with unspecified complications: Secondary | ICD-10-CM

## 2020-08-23 DIAGNOSIS — L409 Psoriasis, unspecified: Secondary | ICD-10-CM

## 2020-08-23 DIAGNOSIS — Z1231 Encounter for screening mammogram for malignant neoplasm of breast: Secondary | ICD-10-CM

## 2020-08-23 DIAGNOSIS — E785 Hyperlipidemia, unspecified: Secondary | ICD-10-CM | POA: Diagnosis not present

## 2020-08-23 DIAGNOSIS — I10 Essential (primary) hypertension: Secondary | ICD-10-CM

## 2020-08-23 DIAGNOSIS — Z0000 Encounter for general adult medical examination without abnormal findings: Secondary | ICD-10-CM

## 2020-08-23 DIAGNOSIS — E1169 Type 2 diabetes mellitus with other specified complication: Secondary | ICD-10-CM

## 2020-08-23 DIAGNOSIS — E1140 Type 2 diabetes mellitus with diabetic neuropathy, unspecified: Secondary | ICD-10-CM

## 2020-08-23 LAB — POCT URINALYSIS DIPSTICK
Bilirubin, UA: NEGATIVE
Blood, UA: NEGATIVE
Glucose, UA: NEGATIVE
Ketones, UA: NEGATIVE
Leukocytes, UA: NEGATIVE
Nitrite, UA: NEGATIVE
Protein, UA: POSITIVE — AB
Spec Grav, UA: 1.02 (ref 1.010–1.025)
Urobilinogen, UA: 0.2 E.U./dL
pH, UA: 5 (ref 5.0–8.0)

## 2020-08-23 NOTE — Patient Instructions (Signed)
[facility] Dr. Nehemiah Massed [id number]

## 2020-08-23 NOTE — Progress Notes (Signed)
Date:  08/23/2020   Name:  Elizabeth Klein   DOB:  18-May-1950   MRN:  [id number]   Chief Complaint: Annual Exam (Breast exam no pap) Elizabeth Klein is a 70 y.o. female who presents today for her Complete Annual Exam. She feels well. She reports exercising none. She reports she is sleeping fairly well. Breast complaints none.  Mammogram: 09/2019 DEXA: 09/2018 osteopenia Pap smear: discontinued Colonoscopy: 09/2015 repeat 5 or 10 yrs?  Immunization History  Administered Date(s) Administered   Influenza, High Dose Seasonal PF 01/07/2018, 12/02/2018   Influenza-Unspecified 10/26/2016   PFIZER(Purple Top)SARS-COV-2 Vaccination 04/21/2019, 05/12/2019, 11/23/2019, 06/16/2020   Pneumococcal Conjugate-13 03/19/2017   Pneumococcal Polysaccharide-23 08/20/2018   Zoster Recombinat (Shingrix) 04/30/2017    Hypertension This is a chronic problem. The problem is controlled. Pertinent negatives include no chest pain, headaches, palpitations or shortness of breath. Past treatments include angiotensin blockers and beta blockers. The current treatment provides significant improvement. There are no compliance problems.  Hypertensive end-organ damage includes kidney disease.  Diabetes She presents for her follow-up (followed by Endo) diabetic visit. She has type 2 diabetes mellitus. Her disease course has been stable. Pertinent negatives for hypoglycemia include no dizziness, headaches, nervousness/anxiousness or tremors. Pertinent negatives for diabetes include no chest pain, no fatigue, no polydipsia and no polyuria. Current diabetic treatment includes oral agent (monotherapy) (metformin, Lantus, Ozempic). She is compliant with treatment all of the time. There is no change in her home blood glucose trend. An ACE inhibitor/angiotensin II receptor blocker is being taken.  Hyperlipidemia This is a chronic problem. The problem is controlled. Pertinent negatives include no chest pain or shortness of  breath. Current antihyperlipidemic treatment includes statins. The current treatment provides significant improvement of lipids.   Lab Results  Component Value Date   CREATININE 1.08 (H) 08/23/2019   BUN 25 (H) 08/23/2019   NA 138 08/23/2019   K 4.8 08/23/2019   CL 108 08/23/2019   CO2 23 08/23/2019   Lab Results  Component Value Date   CHOL 111 09/06/2019   HDL 44 09/06/2019   LDLCALC 49 09/06/2019   TRIG 91 09/06/2019   CHOLHDL 2.8 08/20/2018   Lab Results  Component Value Date   TSH 1.271 08/23/2019   Lab Results  Component Value Date   HGBA1C 6.6 (A) 04/03/2020   Lab Results  Component Value Date   WBC 10.7 (H) 08/23/2019   HGB 11.7 (L) 08/23/2019   HCT 36.8 08/23/2019   MCV 91.5 08/23/2019   PLT 306 08/23/2019   Lab Results  Component Value Date   ALT 28 08/23/2019   AST 23 08/23/2019   ALKPHOS 83 08/23/2019   BILITOT 0.6 08/23/2019     Review of Systems  Constitutional:  Negative for chills, fatigue and fever.  HENT:  Negative for congestion, hearing loss, tinnitus, trouble swallowing and voice change.   Eyes:  Negative for visual disturbance.  Respiratory:  Negative for cough, chest tightness, shortness of breath and wheezing.   Cardiovascular:  Negative for chest pain, palpitations and leg swelling.  Gastrointestinal:  Negative for abdominal pain, constipation, diarrhea and vomiting.  Endocrine: Negative for polydipsia and polyuria.  Genitourinary:  Negative for dysuria, frequency, genital sores, vaginal bleeding and vaginal discharge.  Musculoskeletal:  Negative for arthralgias, gait problem and joint swelling.  Skin:  Negative for color change and rash.  Neurological:  Negative for dizziness, tremors, light-headedness and headaches.  Hematological:  Negative for adenopathy. Does not bruise/bleed easily.  Psychiatric/Behavioral:  Negative  for dysphoric mood and sleep disturbance. The patient is not nervous/anxious.    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   Microalbuminuria 04/30/2017   CKD (chronic kidney disease) stage 3, GFR 30-59 ml/min (HCC) 04/30/2017   Type II diabetes mellitus with complication ([place]) 86/76/7209   Essential hypertension 03/19/2017   CAD (coronary artery disease) 03/19/2017   History of kidney stones 03/19/2017   Psoriasis 03/19/2017   Hyperlipidemia associated with type 2 diabetes mellitus ([place]) 03/19/2017   Nocturnal leg cramps 03/19/2017   Gait instability 03/19/2017   Insomnia 03/19/2017   Allergic rhinitis 03/19/2017    Allergies  Allergen Reactions   Prednisone Other (See Comments)    Intolerance  - confusion     Past Surgical History:  Procedure Laterality Date   BREAST BIOPSY Right    neg   CATARACT EXTRACTION W/PHACO Left 09/27/2019   Procedure: CATARACT EXTRACTION PHACO AND INTRAOCULAR LENS PLACEMENT ([place]) LEFT DIABETIC ISTENT INJ INTRAVITREAL KENALOG INJ;  Surgeon: Eulogio Bear, MD;  Location: [place];  Service: Ophthalmology;  Laterality: Left;  2.83 0:28.7   CATARACT EXTRACTION W/PHACO Right 12/13/2019   Procedure: CATARACT EXTRACTION PHACO AND INTRAOCULAR LENS PLACEMENT ([place]) RIGHT ISTENT INJ KENALOG INJ DIABETIC;  Surgeon: Eulogio Bear, MD;  Location: [place];  Service: Ophthalmology;  Laterality: Right;  1.39 0:21.5   CESAREAN SECTION      Social History   Tobacco Use   Smoking status: Never   Smokeless tobacco: Never  Vaping Use   Vaping Use: Never used  Substance Use Topics   Alcohol use: No   Drug use: No     Medication list has been reviewed and updated.  No outpatient medications have been marked as taking for the 08/23/20 encounter (Office Visit) with Glean Hess, MD.    [facility] 2/9 Scores 08/23/2020 06/08/2020 04/03/2020 02/23/2020  PHQ - 2 Score 4 0 0 0  PHQ- 9 Score 11 2 1 5     GAD 7 : Generalized Anxiety Score  08/23/2020 06/08/2020 04/03/2020 02/23/2020  Nervous, Anxious, on Edge 0 0 0 0  Control/stop worrying 0 0 1 0  Worry too much - different things 0 0 1 0  Trouble relaxing 0 0 0 0  Restless 0 0 0 0  Easily annoyed or irritable 0 0 0 0  Afraid - awful might happen 0 0 0 0  Total GAD 7 Score 0 0 2 0  Anxiety Difficulty - - - Not difficult at all    BP Readings from Last 3 Encounters:  08/23/20 106/78  06/08/20 104/72  04/03/20 104/72    Physical Exam Vitals and nursing note reviewed.  Constitutional:      General: She is not in acute distress.    Appearance: She is well-developed.  HENT:     Head: Normocephalic and atraumatic.     Right Ear: Tympanic membrane and ear canal normal.     Left Ear: Tympanic membrane and ear canal normal.     Nose:     Right Sinus: No maxillary sinus tenderness.     Left Sinus: No maxillary sinus tenderness.  Eyes:     General: No scleral icterus.       Right eye: No discharge.        Left eye: No discharge.     Conjunctiva/sclera: Conjunctivae normal.  Neck:  Thyroid: No thyromegaly.     Vascular: No carotid bruit.  Cardiovascular:     Rate and Rhythm: Normal rate and regular rhythm.     Pulses: Normal pulses.     Heart sounds: Normal heart sounds.  Pulmonary:     Effort: Pulmonary effort is normal. No respiratory distress.     Breath sounds: No wheezing.  Chest:  Breasts:    Right: No mass, nipple discharge, skin change or tenderness.     Left: No mass, nipple discharge, skin change or tenderness.  Abdominal:     General: Bowel sounds are normal.     Palpations: Abdomen is soft.     Tenderness: There is no abdominal tenderness.  Musculoskeletal:     Cervical back: Normal range of motion. No erythema.     Right lower leg: No edema.     Left lower leg: No edema.  Lymphadenopathy:     Cervical: No cervical adenopathy.  Skin:    General: Skin is warm and dry.     Findings: No rash.     Comments: Psoriatic rash on scalp, hands and  arms Also pigmented streak on right middle finger nail  Neurological:     Mental Status: She is alert and oriented to person, place, and time.     Cranial Nerves: No cranial nerve deficit.     Sensory: No sensory deficit.     Deep Tendon Reflexes: Reflexes are normal and symmetric.  Psychiatric:        Attention and Perception: Attention normal.        Mood and Affect: Mood normal.    Wt Readings from Last 3 Encounters:  08/23/20 139 lb (63 kg)  06/08/20 139 lb (63 kg)  04/03/20 140 lb (63.5 kg)    BP 106/78   Pulse 82   Temp 98.3 F (36.8 C) (Oral)   Ht 5' (1.524 m)   Wt 139 lb (63 kg)   SpO2 98%   BMI 27.15 kg/m   Assessment and Plan: 1. Annual physical exam Exam is normal except for weight. Encourage regular exercise and appropriate dietary changes. Mild mood changes are due to stress from her husband which she is handling fairly well - no medications are needed  2. Encounter for screening mammogram for breast cancer Schedule at [facility]  - MM 3D SCREEN BREAST BILATERAL; Future  3. Essential hypertension Clinically stable exam with well controlled BP. Tolerating medications without side effects at this time. Pt to continue current regimen and low sodium diet; benefits of regular exercise as able discussed. - CBC with Differential/Platelet - TSH - POCT urinalysis dipstick  4. Type II diabetes mellitus with complication (HCC) Clinically stable by exam and report without s/s of hypoglycemia. DM complicated by hypertension and dyslipidemia. Tolerating medications well without side effects or other concerns.She says that she is also taking glimepiride 1 mg daily along with metformin, Lantus and Ozempic She sees Endo in 2 days. - Comprehensive metabolic panel  5. Hyperlipidemia associated with type 2 diabetes mellitus ([place]) Tolerating statin medication without side effects at this time LDL is at goal of < 70 on current dose Continue same therapy without change at this  time. - Lipid panel  6. Stage 3 chronic kidney disease, unspecified whether stage 3a or 3b CKD ([place]) Continue monitoring - Comprehensive metabolic panel  7. Psoriasis Continue topical meds Having a slight flare up with nail changes Recommend Dr. Nehemiah Massed    Partially dictated using Dragon software. Any errors are  unintentional.  Halina Maidens, MD [facility]  08/23/2020

## 2020-08-24 LAB — CBC WITH DIFFERENTIAL/PLATELET
Basophils Absolute: 0.1 10*3/uL (ref 0.0–0.2)
Basos: 1 %
EOS (ABSOLUTE): 0.6 10*3/uL — ABNORMAL HIGH (ref 0.0–0.4)
Eos: 7 %
Hematocrit: 37 % (ref 34.0–46.6)
Hemoglobin: 11.7 g/dL (ref 11.1–15.9)
Immature Grans (Abs): 0 10*3/uL (ref 0.0–0.1)
Immature Granulocytes: 0 %
Lymphocytes Absolute: 2.7 10*3/uL (ref 0.7–3.1)
Lymphs: 28 %
MCH: 28.7 pg (ref 26.6–33.0)
MCHC: 31.6 g/dL (ref 31.5–35.7)
MCV: 91 fL (ref 79–97)
Monocytes Absolute: 0.6 10*3/uL (ref 0.1–0.9)
Monocytes: 6 %
Neutrophils Absolute: 5.6 10*3/uL (ref 1.4–7.0)
Neutrophils: 58 %
Platelets: 322 10*3/uL (ref 150–450)
RBC: 4.08 x10E6/uL (ref 3.77–5.28)
RDW: 13.2 % (ref 11.7–15.4)
WBC: 9.7 10*3/uL (ref 3.4–10.8)

## 2020-08-24 LAB — COMPREHENSIVE METABOLIC PANEL
ALT: 22 IU/L (ref 0–32)
AST: 21 IU/L (ref 0–40)
Albumin/Globulin Ratio: 1.6 (ref 1.2–2.2)
Albumin: 4.5 g/dL (ref 3.8–4.8)
Alkaline Phosphatase: 87 IU/L (ref 44–121)
BUN/Creatinine Ratio: 22 (ref 12–28)
BUN: 25 mg/dL (ref 8–27)
Bilirubin Total: 0.3 mg/dL (ref 0.0–1.2)
CO2: 17 mmol/L — ABNORMAL LOW (ref 20–29)
Calcium: 9.6 mg/dL (ref 8.7–10.3)
Chloride: 105 mmol/L (ref 96–106)
Creatinine, Ser: 1.14 mg/dL — ABNORMAL HIGH (ref 0.57–1.00)
Globulin, Total: 2.8 g/dL (ref 1.5–4.5)
Glucose: 103 mg/dL — ABNORMAL HIGH (ref 65–99)
Potassium: 6.2 mmol/L — ABNORMAL HIGH (ref 3.5–5.2)
Sodium: 140 mmol/L (ref 134–144)
Total Protein: 7.3 g/dL (ref 6.0–8.5)
eGFR: 52 mL/min/{1.73_m2} — ABNORMAL LOW (ref >59)

## 2020-08-24 LAB — LIPID PANEL
Chol/HDL Ratio: 2.8 ratio (ref 0.0–4.4)
Cholesterol, Total: 110 mg/dL (ref 100–199)
HDL: 40 mg/dL (ref >39)
LDL Chol Calc (NIH): 47 mg/dL (ref 0–99)
Triglycerides: 128 mg/dL (ref 0–149)
VLDL Cholesterol Cal: 23 mg/dL (ref 5–40)

## 2020-08-24 LAB — TSH: TSH: 1.69 u[IU]/mL (ref 0.450–4.500)

## 2020-08-25 DIAGNOSIS — Z794 Long term (current) use of insulin: Secondary | ICD-10-CM | POA: Diagnosis not present

## 2020-08-25 DIAGNOSIS — I152 Hypertension secondary to endocrine disorders: Secondary | ICD-10-CM | POA: Diagnosis not present

## 2020-08-25 DIAGNOSIS — E1142 Type 2 diabetes mellitus with diabetic polyneuropathy: Secondary | ICD-10-CM | POA: Diagnosis not present

## 2020-08-25 DIAGNOSIS — E785 Hyperlipidemia, unspecified: Secondary | ICD-10-CM | POA: Diagnosis not present

## 2020-08-25 DIAGNOSIS — E1169 Type 2 diabetes mellitus with other specified complication: Secondary | ICD-10-CM | POA: Diagnosis not present

## 2020-08-25 DIAGNOSIS — E1159 Type 2 diabetes mellitus with other circulatory complications: Secondary | ICD-10-CM | POA: Diagnosis not present

## 2020-08-25 DIAGNOSIS — L409 Psoriasis, unspecified: Secondary | ICD-10-CM | POA: Diagnosis not present

## 2020-08-29 DIAGNOSIS — L409 Psoriasis, unspecified: Principal | ICD-10-CM

## 2020-09-11 ENCOUNTER — Other Ambulatory Visit: Payer: Self-pay | Admitting: Internal Medicine

## 2020-09-11 DIAGNOSIS — K219 Gastro-esophageal reflux disease without esophagitis: Secondary | ICD-10-CM

## 2020-09-11 DIAGNOSIS — I2510 Atherosclerotic heart disease of native coronary artery without angina pectoris: Secondary | ICD-10-CM

## 2020-09-11 DIAGNOSIS — I10 Essential (primary) hypertension: Secondary | ICD-10-CM

## 2020-09-11 NOTE — Telephone Encounter (Signed)
Requested Prescriptions  Pending Prescriptions Disp Refills  . metoprolol tartrate (LOPRESSOR) 50 MG tablet [Pharmacy Med Name: Metoprolol Tartrate 50 MG Oral Tablet] 180 tablet 1    Sig: TAKE 1 TABLET BY MOUTH  TWICE DAILY     Cardiovascular:  Beta Blockers Passed - 09/11/2020 10:16 PM      Passed - Last BP in normal range    BP Readings from Last 1 Encounters:  08/23/20 106/78         Passed - Last Heart Rate in normal range    Pulse Readings from Last 1 Encounters:  08/23/20 82         Passed - Valid encounter within last 6 months    Recent Outpatient Visits          2 weeks ago Annual physical exam   [facility] Glean Hess, MD   3 months ago Vertigo   [facility] Glean Hess, MD   5 months ago Type II diabetes mellitus with complication [facility])   [facility] Glean Hess, MD   6 months ago Essential hypertension   [facility] Glean Hess, MD   10 months ago Hearing loss of right ear due to cerumen impaction   [facility] Glean Hess, MD      Future Appointments            In 5 months Army Melia Jesse Sans, MD [facility], [place]   In 11 months Army Melia Jesse Sans, MD [facility], [place]           . omeprazole (PRILOSEC) 20 MG capsule [Pharmacy Med Name: Omeprazole 20 MG Oral Capsule Delayed Release] 90 capsule 3    Sig: TAKE 1 CAPSULE BY MOUTH  DAILY     Gastroenterology: Proton Pump Inhibitors Passed - 09/11/2020 10:16 PM      Passed - Valid encounter within last 12 months    Recent Outpatient Visits          2 weeks ago Annual physical exam   [facility] Glean Hess, MD   3 months ago Vertigo   [facility] Glean Hess, MD   5 months ago Type II diabetes mellitus with complication [facility])   [facility] Glean Hess, MD   6 months ago Essential hypertension   [facility] Glean Hess, MD   10 months ago Hearing  loss of right ear due to cerumen impaction   [facility] Glean Hess, MD      Future Appointments            In 5 months Army Melia Jesse Sans, MD [facility], [place]   In 11 months Army Melia Jesse Sans, MD [facility]

## 2020-09-13 ENCOUNTER — Other Ambulatory Visit: Payer: Self-pay | Admitting: Internal Medicine

## 2020-09-13 DIAGNOSIS — G4700 Insomnia, unspecified: Secondary | ICD-10-CM

## 2020-09-13 DIAGNOSIS — E118 Type 2 diabetes mellitus with unspecified complications: Secondary | ICD-10-CM

## 2020-09-20 DIAGNOSIS — Z20822 Contact with and (suspected) exposure to covid-19: Secondary | ICD-10-CM | POA: Diagnosis not present

## 2020-09-27 DIAGNOSIS — H401121 Primary open-angle glaucoma, left eye, mild stage: Secondary | ICD-10-CM | POA: Diagnosis not present

## 2020-10-04 ENCOUNTER — Encounter: Admit: 2020-10-04 | Discharge: 2020-10-05 | Payer: MEDICARE | Attending: Dermatology | Primary: Dermatology

## 2020-10-04 DIAGNOSIS — L281 Prurigo nodularis: Principal | ICD-10-CM

## 2020-10-04 DIAGNOSIS — L820 Inflamed seborrheic keratosis: Principal | ICD-10-CM

## 2020-10-04 MED ORDER — LIDOCAINE 5 % TOPICAL OINTMENT
Freq: Two times a day (BID) | TOPICAL | 6 refills | 0.00000 days | Status: CP
Start: 2020-10-04 — End: 2021-10-04

## 2020-10-16 ENCOUNTER — Other Ambulatory Visit: Payer: Self-pay

## 2020-10-16 ENCOUNTER — Ambulatory Visit (INDEPENDENT_AMBULATORY_CARE_PROVIDER_SITE_OTHER): Payer: Medicare Other

## 2020-10-16 VITALS — BP 110/70 | HR 92 | Temp 98.1°F | Resp 16 | Ht 60.0 in | Wt 141.4 lb

## 2020-10-16 DIAGNOSIS — Z0000 Encounter for general adult medical examination without abnormal findings: Secondary | ICD-10-CM

## 2020-10-16 DIAGNOSIS — Z780 Asymptomatic menopausal state: Secondary | ICD-10-CM

## 2020-10-16 NOTE — Patient Instructions (Signed)
Elizabeth Klein , Thank you for taking time to come for your Medicare Wellness Visit. I appreciate your ongoing commitment to your health goals. Please review the following plan we discussed and let me know if I can assist you in the future.   Screening recommendations/referrals: Colonoscopy: done 09/26/15. Repeat 09/2025 Mammogram: done 10/25/19. Scheduled for 10/25/20 Bone Density: done 10/14/18. Please call [phone] to schedule your bone density screening.  Recommended yearly ophthalmology/optometry visit for glaucoma screening and checkup Recommended yearly dental visit for hygiene and checkup  Vaccinations: Influenza vaccine: due 10/26/20 Pneumococcal vaccine: done 08/20/18 Tdap vaccine: due  Shingles vaccine: done 04/30/17; we will contact [facility] for second dose information   Covid-19:done 04/21/19, 05/12/19, 11/23/19 & 06/16/20  Advanced directives: Advance directive discussed with you today. I have provided a copy for you to complete at home and have notarized. Once this is complete please bring a copy in to our office so we can scan it into your chart.   Conditions/risks identified: Recommend healthy eating and physical activity to maintain A1c level  Next appointment: Follow up in one year for your annual wellness visit    Preventive Care 65 Years and Older, Female Preventive care refers to lifestyle choices and visits with your health care provider that can promote health and wellness. What does preventive care include? A yearly physical exam. This is also called an annual well check. Dental exams once or twice a year. Routine eye exams. Ask your health care provider how often you should have your eyes checked. Personal lifestyle choices, including: Daily care of your teeth and gums. Regular physical activity. Eating a healthy diet. Avoiding tobacco and drug use. Limiting alcohol use. Practicing safe sex. Taking low-dose aspirin every day. Taking vitamin and mineral supplements as  recommended by your health care provider. What happens during an annual well check? The services and screenings done by your health care provider during your annual well check will depend on your age, overall health, lifestyle risk factors, and family history of disease. Counseling  Your health care provider may ask you questions about your: Alcohol use. Tobacco use. Drug use. Emotional well-being. Home and relationship well-being. Sexual activity. Eating habits. History of falls. Memory and ability to understand (cognition). Work and work Statistician. Reproductive health. Screening  You may have the following tests or measurements: Height, weight, and BMI. Blood pressure. Lipid and cholesterol levels. These may be checked every 5 years, or more frequently if you are over 73 years old. Skin check. Lung cancer screening. You may have this screening every year starting at age 78 if you have a 30-pack-year history of smoking and currently smoke or have quit within the past 15 years. Fecal occult blood test (FOBT) of the stool. You may have this test every year starting at age 50. Flexible sigmoidoscopy or colonoscopy. You may have a sigmoidoscopy every 5 years or a colonoscopy every 10 years starting at age 72. Hepatitis C blood test. Hepatitis B blood test. Sexually transmitted disease (STD) testing. Diabetes screening. This is done by checking your blood sugar (glucose) after you have not eaten for a while (fasting). You may have this done every 1-3 years. Bone density scan. This is done to screen for osteoporosis. You may have this done starting at age 52. Mammogram. This may be done every 1-2 years. Talk to your health care provider about how often you should have regular mammograms. Talk with your health care provider about your test results, treatment options, and if necessary, the  need for more tests. Vaccines  Your health care provider may recommend certain vaccines, such  as: Influenza vaccine. This is recommended every year. Tetanus, diphtheria, and acellular pertussis (Tdap, Td) vaccine. You may need a Td booster every 10 years. Zoster vaccine. You may need this after age 34. Pneumococcal 13-valent conjugate (PCV13) vaccine. One dose is recommended after age 87. Pneumococcal polysaccharide (PPSV23) vaccine. One dose is recommended after age 55. Talk to your health care provider about which screenings and vaccines you need and how often you need them. This information is not intended to replace advice given to you by your health care provider. Make sure you discuss any questions you have with your health care provider. Document Released: 03/10/2015 Document Revised: 11/01/2015 Document Reviewed: 12/13/2014 Elsevier Interactive Patient Education  2017 [place] Prevention in the Home Falls can cause injuries. They can happen to people of all ages. There are many things you can do to make your home safe and to help prevent falls. What can I do on the outside of my home? Regularly fix the edges of walkways and driveways and fix any cracks. Remove anything that might make you trip as you walk through a door, such as a raised step or threshold. Trim any bushes or trees on the path to your home. Use bright outdoor lighting. Clear any walking paths of anything that might make someone trip, such as rocks or tools. Regularly check to see if handrails are loose or broken. Make sure that both sides of any steps have handrails. Any raised decks and porches should have guardrails on the edges. Have any leaves, snow, or ice cleared regularly. Use sand or salt on walking paths during winter. Clean up any spills in your garage right away. This includes oil or grease spills. What can I do in the bathroom? Use night lights. Install grab bars by the toilet and in the tub and shower. Do not use towel bars as grab bars. Use non-skid mats or decals in the tub or  shower. If you need to sit down in the shower, use a plastic, non-slip stool. Keep the floor dry. Clean up any water that spills on the floor as soon as it happens. Remove soap buildup in the tub or shower regularly. Attach bath mats securely with double-sided non-slip rug tape. Do not have throw rugs and other things on the floor that can make you trip. What can I do in the bedroom? Use night lights. Make sure that you have a light by your bed that is easy to reach. Do not use any sheets or blankets that are too big for your bed. They should not hang down onto the floor. Have a firm chair that has side arms. You can use this for support while you get dressed. Do not have throw rugs and other things on the floor that can make you trip. What can I do in the kitchen? Clean up any spills right away. Avoid walking on wet floors. Keep items that you use a lot in easy-to-reach places. If you need to reach something above you, use a strong step stool that has a grab bar. Keep electrical cords out of the way. Do not use floor polish or wax that makes floors slippery. If you must use wax, use non-skid floor wax. Do not have throw rugs and other things on the floor that can make you trip. What can I do with my stairs? Do not leave any items on the  stairs. Make sure that there are handrails on both sides of the stairs and use them. Fix handrails that are broken or loose. Make sure that handrails are as long as the stairways. Check any carpeting to make sure that it is firmly attached to the stairs. Fix any carpet that is loose or worn. Avoid having throw rugs at the top or bottom of the stairs. If you do have throw rugs, attach them to the floor with carpet tape. Make sure that you have a light switch at the top of the stairs and the bottom of the stairs. If you do not have them, ask someone to add them for you. What else can I do to help prevent falls? Wear shoes that: Do not have high heels. Have  rubber bottoms. Are comfortable and fit you well. Are closed at the toe. Do not wear sandals. If you use a stepladder: Make sure that it is fully opened. Do not climb a closed stepladder. Make sure that both sides of the stepladder are locked into place. Ask someone to hold it for you, if possible. Clearly mark and make sure that you can see: Any grab bars or handrails. First and last steps. Where the edge of each step is. Use tools that help you move around (mobility aids) if they are needed. These include: Canes. Walkers. Scooters. Crutches. Turn on the lights when you go into a dark area. Replace any light bulbs as soon as they burn out. Set up your furniture so you have a clear path. Avoid moving your furniture around. If any of your floors are uneven, fix them. If there are any pets around you, be aware of where they are. Review your medicines with your doctor. Some medicines can make you feel dizzy. This can increase your chance of falling. Ask your doctor what other things that you can do to help prevent falls. This information is not intended to replace advice given to you by your health care provider. Make sure you discuss any questions you have with your health care provider. Document Released: 12/08/2008 Document Revised: 07/20/2015 Document Reviewed: 03/18/2014 Elsevier Interactive Patient Education  2017 [employer].

## 2020-10-16 NOTE — Progress Notes (Signed)
Subjective:   Elizabeth Klein is a 70 y.o. female who presents for Medicare Annual (Subsequent) preventive examination.  Review of Systems     Cardiac Risk Factors include: advanced age (>60mn, >>27women);diabetes mellitus;dyslipidemia;hypertension     Objective:    Today's Vitals   10/16/20 0953 10/16/20 0956  BP: 110/70   Pulse: 92   Resp: 16   Temp: 98.1 F (36.7 C)   TempSrc: Oral   SpO2: 98%   Weight: 141 lb 6.4 oz (64.1 kg)   Height: 5' (1.524 m)   PainSc:  3    Body mass index is 27.62 kg/m.  Advanced Directives 10/16/2020 12/13/2019 10/13/2019 09/27/2019 08/06/2019 10/07/2018 10/10/2017  Does Patient Have a Medical Advance Directive? No No No No No No No  Would patient like information on creating a medical advance directive? Yes (MAU/Ambulatory/Procedural Areas - Information given) No - Patient declined Yes (MAU/Ambulatory/Procedural Areas - Information given) No - Patient declined - Yes (MAU/Ambulatory/Procedural Areas - Information given) No - Patient declined    Current Medications (verified) Outpatient Encounter Medications as of 10/16/2020  Medication Sig   acetaminophen (TYLENOL) 500 MG tablet Take 1,000 mg by mouth every 8 (eight) hours as needed.   Ascorbic Acid (VITAMIN C PO) Take by mouth.   aspirin EC 81 MG tablet Take 1 tablet (81 mg total) by mouth daily.   atorvastatin (LIPITOR) 80 MG tablet TAKE 1 TABLET BY MOUTH  DAILY   BD INSULIN SYRINGE U/F 30G X 1/2" 0.5 ML MISC USE WITH LANTUS INJECTIONS  FOR TYPE 2 DIABETES  MELLITUS   glimepiride (AMARYL) 2 MG tablet Take 2 mg by mouth daily.   glucose blood (ACCU-CHEK SMARTVIEW) test strip USE 3 TIMES DAILY   insulin glargine (LANTUS) 100 UNIT/ML injection Inject 0.4 mLs (40 Units total) into the skin every morning. Pt taking QAM (Patient taking differently: Inject 28 Units into the skin every morning. Pt taking QAM)   lidocaine (XYLOCAINE) 5 % ointment Apply topically.   losartan (COZAAR) 50 MG tablet TAKE  1 TABLET BY MOUTH  DAILY   metFORMIN (GLUCOPHAGE) 1000 MG tablet TAKE 1 TABLET BY MOUTH  TWICE DAILY WITH MEALS   metoprolol tartrate (LOPRESSOR) 50 MG tablet TAKE 1 TABLET BY MOUTH  TWICE DAILY   montelukast (SINGULAIR) 10 MG tablet TAKE 1 TABLET BY MOUTH  DAILY   Omega-3 Fatty Acids (FISH OIL) 1000 MG CAPS Take by mouth.   omeprazole (PRILOSEC) 20 MG capsule TAKE 1 CAPSULE BY MOUTH  DAILY   ondansetron (ZOFRAN-ODT) 4 MG disintegrating tablet Take 1 tablet (4 mg total) by mouth every 8 (eight) hours as needed for nausea or vomiting.   ONETOUCH DELICA PLUS LANCETS MISC 1 each by Does not apply route 2 (two) times daily.   Semaglutide, 1 MG/DOSE, (OZEMPIC, 1 MG/DOSE,) 4 MG/3ML SOPN Inject into the skin. Inject 1 mg into the skin every 7 days   traZODone (DESYREL) 50 MG tablet TAKE 1 TABLET BY MOUTH AT  BEDTIME   vitamin B-12 (CYANOCOBALAMIN) 1000 MCG tablet Take 1,000 mcg by mouth daily.   [DISCONTINUED] Calcipotriene-Betameth Diprop (ENSTILAR) 0.005-0.064 % FOAM Apply topically.   [DISCONTINUED] clobetasol cream (TEMOVATE) 0AB-123456789% Apply 1 application topically 2 (two) times daily.   [DISCONTINUED] DUOBRII 0.01-0.045 % LOTN APPLY 1 APPLICATION  TOPICALLY 2 (TWO) TIMES  DAILY.   [DISCONTINUED] fluocinonide ointment (LIDEX) 0AB-123456789% Apply 1 application topically 2 (two) times daily.   [DISCONTINUED] Syringe, Disposable, (B-D 30CC SYRINGE) 30 ML MISC Use with  Lantus Injections for DM2 ICD 10 E11.9   No facility-administered encounter medications on file as of 10/16/2020.    Allergies (verified) Prednisone   History: Past Medical History:  Diagnosis Date   Acid reflux    Chest pain    Diabetes mellitus without complication (HCC)    Hepatitis    age 89   High cholesterol    Hypertension    Kidney stones    Migraines    Psoriasis    Rapid heart rate    Shingles    spring 2021   Vertigo    1x - approx 12 yrs ago   Weight loss    Past Surgical History:  Procedure Laterality Date    BREAST BIOPSY Right    neg   CATARACT EXTRACTION W/PHACO Left 09/27/2019   Procedure: CATARACT EXTRACTION PHACO AND INTRAOCULAR LENS PLACEMENT ([place]) LEFT DIABETIC ISTENT INJ INTRAVITREAL KENALOG INJ;  Surgeon: Eulogio Bear, MD;  Location: [facility];  Service: Ophthalmology;  Laterality: Left;  2.83 0:28.7   CATARACT EXTRACTION W/PHACO Right 12/13/2019   Procedure: CATARACT EXTRACTION PHACO AND INTRAOCULAR LENS PLACEMENT ([place]) RIGHT ISTENT INJ KENALOG INJ DIABETIC;  Surgeon: Eulogio Bear, MD;  Location: [facility];  Service: Ophthalmology;  Laterality: Right;  1.39 0:21.5   CESAREAN SECTION     Family History  Problem Relation Age of Onset   Breast cancer Neg Hx    Diabetes Brother    Heart attack Brother    Diabetes Brother    Heart attack Brother    Diabetes Brother    Social History   Socioeconomic History   Marital status: Married    Spouse name: Not on file   Number of children: 1   Years of education: Not on file   Highest education level: Not on file  Occupational History   Not on file  Tobacco Use   Smoking status: Never   Smokeless tobacco: Never  Vaping Use   Vaping Use: Never used  Substance and Sexual Activity   Alcohol use: No   Drug use: No   Sexual activity: Not on file  Other Topics Concern   Not on file  Social History Narrative          Social Determinants of Health   Financial Resource Strain: Low Risk    Difficulty of Paying Living Expenses: Not hard at all  Food Insecurity: No Food Insecurity   Worried About Charity fundraiser in the Last Year: Never true   [place] in the Last Year: Never true  Transportation Needs: No Transportation Needs   Lack of Transportation (Medical): No   Lack of Transportation (Non-Medical): No  Physical Activity: Inactive   Days of Exercise per Week: 0 days   Minutes of Exercise per Session: 0 min  Stress: No Stress Concern Present   Feeling of Stress : Only a little  Social  Connections: Moderately Isolated   Frequency of Communication with Friends and Family: More than three times a week   Frequency of Social Gatherings with Friends and Family: Twice a week   Attends Religious Services: Never   Marine scientist or Organizations: No   Attends Music therapist: Never   Marital Status: Married    Tobacco Counseling Counseling given: Not Answered   Clinical Intake:  Pre-visit preparation completed: Yes  Pain : 0-10 Pain Score: 3  Pain Type: Chronic pain Pain Location: Leg Pain Orientation: Right Pain Descriptors /  Indicators: Aching, Sore Pain Onset: More than a month ago Pain Frequency: Constant     BMI - recorded: 27.62 Nutritional Status: BMI 25 -29 Overweight Nutritional Risks: None Diabetes: Yes CBG done?: No Did pt. bring in CBG monitor from home?: No  How often do you need to have someone help you when you read instructions, pamphlets, or other written materials from your doctor or pharmacy?: 1 - Never  Nutrition Risk Assessment:  Has the patient had any N/V/D within the last 2 months?  No  Does the patient have any non-healing wounds?  No  Has the patient had any unintentional weight loss or weight gain?  No   Diabetes:  Is the patient diabetic?  Yes  If diabetic, was a CBG obtained today?  No  Did the patient bring in their glucometer from home?  No  How often do you monitor your CBG's? 1-2 times daily .   Financial Strains and Diabetes Management:  Are you having any financial strains with the device, your supplies or your medication? No .  Does the patient want to be seen by Chronic Care Management for management of their diabetes?  No  Would the patient like to be referred to a Nutritionist or for Diabetic Management?  No   Diabetic Exams:  Diabetic Eye Exam: Completed 01/14/20 positive retinopathy.   Diabetic Foot Exam: Completed 02/23/20.  Interpreter Needed?: No  Information entered by :: [name] LPN   Activities of Daily Living In your present state of health, do you have any difficulty performing the following activities: 10/16/2020 08/23/2020  Hearing? Y Y  Comment hearing aids not required -  Vision? N N  Difficulty concentrating or making decisions? Tempie Donning  Walking or climbing stairs? Y Y  Dressing or bathing? N N  Doing errands, shopping? N N  Preparing Food and eating ? N -  Using the Toilet? N -  In the past six months, have you accidently leaked urine? Y -  Comment wears pads for protection -  Do you have problems with loss of bowel control? N -  Managing your Medications? N -  Managing your Finances? N -  Housekeeping or managing your Housekeeping? N -  Some recent data might be hidden    Patient Care Team: Glean Hess, MD as PCP - General (Internal Medicine) Lonia Farber, MD as Consulting Physician (Endocrinology) Laneta Simmers as Physician Assistant (Urology) Eulogio Bear, MD as Consulting Physician (Ophthalmology) Lonia Blood, MD as Referring Physician (Dermatology) Yolonda Kida, MD as Consulting Physician (Cardiology)  Indicate any recent Medical Services you may have received from other than [facility] providers in the past year (date may be approximate).     Assessment:   This is a routine wellness examination for Abigial.  Hearing/Vision screen Hearing Screening - Comments:: Pt c/o mild hearing difficulty occasionally; hearing evaluation last year states no hearing aids necessary  Vision Screening - Comments:: [facility] Dr. Edison Pace  Dietary issues and exercise activities discussed: Current Exercise Habits: The patient does not participate in regular exercise at present, Exercise limited by: None identified   Goals Addressed   None    Depression Screen PHQ 2/9 Scores 10/16/2020 08/23/2020 06/08/2020 04/03/2020 02/23/2020 10/13/2019 08/23/2019  PHQ - 2 Score 2 4 0 0 0 0 0  PHQ- 9  Score '7 11 2 1 5 '$ - 4    Fall Risk Fall Risk  10/16/2020 08/23/2020 06/08/2020 04/03/2020  02/23/2020  Falls in the past year? 0 0 0 0 0  Number falls in past yr: 0 - - - 0  Injury with Fall? 0 - - - 0  Comment - - - - -  Risk for fall due to : No Fall Risks - - - -  Follow up Falls prevention discussed Falls evaluation completed Falls evaluation completed Falls evaluation completed Falls evaluation completed    [place]:  Any stairs in or around the home? Yes  If so, are there any without handrails? No  Home free of loose throw rugs in walkways, pet beds, electrical cords, etc? Yes  Adequate lighting in your home to reduce risk of falls? Yes   ASSISTIVE DEVICES UTILIZED TO PREVENT FALLS:  Life alert? No  Use of a cane, walker or w/c? No  Grab bars in the bathroom? No  Shower chair or bench in shower? Yes  Elevated toilet seat or a handicapped toilet? Yes   TIMED UP AND GO:  Was the test performed? Yes .  Length of time to ambulate 10 feet: 5 sec.   Gait steady and fast without use of assistive device  Cognitive Function: Normal cognitive status assessed by direct observation by this Nurse Health Advisor. No abnormalities found.       6CIT Screen 10/13/2019 10/07/2018  What Year? 0 points 0 points  What month? 0 points 0 points  What time? 0 points 0 points  Count back from 20 0 points 0 points  Months in reverse 0 points 0 points  Repeat phrase 0 points 0 points  Total Score 0 0    Immunizations Immunization History  Administered Date(s) Administered   Influenza, High Dose Seasonal PF 01/07/2018, 12/02/2018   Influenza-Unspecified 10/26/2016   PFIZER(Purple Top)SARS-COV-2 Vaccination 04/21/2019, 05/12/2019, 11/23/2019, 06/16/2020   Pneumococcal Conjugate-13 03/19/2017   Pneumococcal Polysaccharide-23 08/20/2018   Zoster Recombinat (Shingrix) 04/30/2017    TDAP status: Due, Education has been provided regarding the importance of  this vaccine. Advised may receive this vaccine at local pharmacy or Health Dept. Aware to provide a copy of the vaccination record if obtained from local pharmacy or Health Dept. Verbalized acceptance and understanding.  Flu vaccine status: due 10/26/20  Pneumococcal vaccine status: Up to date  Covid-19 vaccine status: Completed vaccines  Qualifies for Shingles Vaccine? Yes   Zostavax completed No   Shingrix Completed?: Yes; we will contact [facility] for vaccine information   Screening Tests Health Maintenance  Topic Date Due   TETANUS/TDAP  Never done   Zoster Vaccines- Shingrix (2 of 2) 06/25/2017   COVID-19 Vaccine (5 - Booster for Pfizer series) 10/16/2020   INFLUENZA VACCINE  09/25/2020   HEMOGLOBIN A1C  10/01/2020   MAMMOGRAM  10/24/2020   OPHTHALMOLOGY EXAM  01/13/2021   FOOT EXAM  02/22/2021   COLONOSCOPY (Pts 45-76yr Insurance coverage will need to be confirmed)  09/25/2025   DEXA SCAN  Completed   Hepatitis C Screening  Completed   PNA vac Low Risk Adult  Completed   HPV VACCINES  Aged Out    Health Maintenance  Health Maintenance Due  Topic Date Due   TETANUS/TDAP  Never done   Zoster Vaccines- Shingrix (2 of 2) 06/25/2017   COVID-19 Vaccine (5 - Booster for Pfizer series) 10/16/2020   INFLUENZA VACCINE  09/25/2020   HEMOGLOBIN A1C  10/01/2020    Colorectal cancer screening: Type of screening: Colonoscopy. Completed 09/26/15. Repeat every 10 years  Mammogram status:  Completed 10/25/19. Repeat every year  Bone Density status: Completed 10/14/18. Results reflect: Bone density results: OSTEOPENIA. Repeat every 2 years.  Lung Cancer Screening: (Low Dose CT Chest recommended if Age 31-80 years, 30 pack-year currently smoking OR have quit w/in 15years.) does not qualify.   Additional Screening:  Hepatitis C Screening: does qualify; Completed 06/02/17  Vision Screening: Recommended annual ophthalmology exams for early detection of glaucoma and other disorders of the  eye. Is the patient up to date with their annual eye exam?  Yes  Who is the provider or what is the name of the office in which the patient attends annual eye exams? Dr. Edison Pace.   Dental Screening: Recommended annual dental exams for proper oral hygiene  Community Resource Referral / Chronic Care Management: CRR required this visit?  No   CCM required this visit?  No      Plan:     I have personally reviewed and noted the following in the patient's chart:   Medical and social history Use of alcohol, tobacco or illicit drugs  Current medications and supplements including opioid prescriptions.  Functional ability and status Nutritional status Physical activity Advanced directives List of other physicians Hospitalizations, surgeries, and ER visits in previous 12 months Vitals Screenings to include cognitive, depression, and falls Referrals and appointments  In addition, I have reviewed and discussed with patient certain preventive protocols, quality metrics, and best practice recommendations. A written personalized care plan for preventive services as well as general preventive health recommendations were provided to patient.     [name], LPN   [id number]   Nurse Notes: none

## 2020-10-17 DIAGNOSIS — E113212 Type 2 diabetes mellitus with mild nonproliferative diabetic retinopathy with macular edema, left eye: Secondary | ICD-10-CM | POA: Diagnosis not present

## 2020-10-17 LAB — HM DIABETES EYE EXAM

## 2020-10-18 ENCOUNTER — Encounter: Payer: Self-pay | Admitting: Internal Medicine

## 2020-10-18 DIAGNOSIS — F5101 Primary insomnia: Secondary | ICD-10-CM

## 2020-10-25 ENCOUNTER — Other Ambulatory Visit: Payer: Self-pay

## 2020-10-25 ENCOUNTER — Ambulatory Visit
Admission: RE | Admit: 2020-10-25 | Discharge: 2020-10-25 | Disposition: A | Discharge status: Home / Self Care | Payer: Medicare Other | Source: Ambulatory Visit | Attending: Internal Medicine | Admitting: Internal Medicine

## 2020-10-25 DIAGNOSIS — Z1231 Encounter for screening mammogram for malignant neoplasm of breast: Secondary | ICD-10-CM | POA: Insufficient documentation

## 2020-10-31 ENCOUNTER — Ambulatory Visit
Admission: RE | Admit: 2020-10-31 | Discharge: 2020-10-31 | Disposition: A | Discharge status: Home / Self Care | Payer: Medicare Other | Source: Ambulatory Visit | Attending: Internal Medicine | Admitting: Internal Medicine

## 2020-10-31 ENCOUNTER — Other Ambulatory Visit: Payer: Self-pay

## 2020-10-31 DIAGNOSIS — Z780 Asymptomatic menopausal state: Secondary | ICD-10-CM | POA: Insufficient documentation

## 2020-10-31 DIAGNOSIS — M85851 Other specified disorders of bone density and structure, right thigh: Secondary | ICD-10-CM | POA: Diagnosis not present

## 2020-11-01 NOTE — Progress Notes (Signed)
Pt viewed labs on Mychart.  KP 

## 2020-11-03 ENCOUNTER — Ambulatory Visit: Payer: Self-pay | Admitting: *Deleted

## 2020-11-03 ENCOUNTER — Encounter: Payer: Self-pay | Admitting: Internal Medicine

## 2020-11-03 ENCOUNTER — Ambulatory Visit (INDEPENDENT_AMBULATORY_CARE_PROVIDER_SITE_OTHER): Payer: Medicare Other | Admitting: Internal Medicine

## 2020-11-03 ENCOUNTER — Other Ambulatory Visit: Payer: Self-pay

## 2020-11-03 VITALS — BP 128/74 | HR 97 | Ht 60.0 in | Wt 141.0 lb

## 2020-11-03 DIAGNOSIS — R21 Rash and other nonspecific skin eruption: Secondary | ICD-10-CM | POA: Diagnosis not present

## 2020-11-03 NOTE — Patient Instructions (Addendum)
Stop using the compounded cream  Use Cerave moisturizing cream to face.  Take Benadryl at bedtime to reduce itching.  Continue to take Allegra daily  Use cool compresses to relieve facial discomfort  Use Saline eye drops for eye comfort

## 2020-11-03 NOTE — Telephone Encounter (Signed)
Entire face itching. Went to [facility] for psoriasis, given a compound on August 10, using everyday. Noticed face itching yesterday. Both eyes swollen this morning with drainage. Took Allegra last night but itched throughout night. No SOB/difficulty swallowing reported. Patient called Dermatology they are closed today. Instructed patient to not use the compound this morning. Cold compress for comfort. Appointment for 11:20a with pcp. Reason for Disposition . [1] MODERATE-SEVERE local itching (i.e., interferes with work, school, activities) AND [2] not improved after 24 hours of hydrocortisone cream  Answer Assessment - Initial Assessment Questions 1. DESCRIPTION: "Describe the itching you are having." "Where is it located?"     Face and head 2. SEVERITY: "How bad is it?"    - MILD - doesn't interfere with normal activities   - MODERATE-SEVERE: interferes with work, school, sleep, or other activities      severe 3. SCRATCHING: "Are there any scratch marks? Bleeding?"     One scratch mark beside her eye 4. ONSET: "When did the itching begin?"      yesterday 5. CAUSE: "What do you think is causing the itching?"      Possibly a compound from the dermatologist 6. OTHER SYMPTOMS: "Do you have any other symptoms?"      Swollen eyelids this morning. 7. PREGNANCY: "Is there any chance you are pregnant?" "When was your last menstrual period?"     na  Protocols used: Itching - Localized-A-AH

## 2020-11-03 NOTE — Progress Notes (Signed)
Date:  11/03/2020   Name:  Elizabeth Klein   DOB:  1950/06/13   MRN:  [id number]   Chief Complaint: Allergic Reaction (Face itching with swollen eye lids after using medication for psoriasis. )  Allergic Reaction This is a new problem. The current episode started yesterday. The problem has been waxing and waning since onset. The problem is moderate. The patient was exposed to a prescription drug. The exposure occurred at Home. Associated symptoms include eye redness, itching and a rash (with itching). Pertinent negatives include no chest pain. Swelling is present on the eyes and face. Past treatments include nothing. Patient seen [facility] dermatology [facility] skin center in [place] and was given compounded lidocaine ointment twice a day for itchy spots on scalp.  The ointment appears to be lidocaine with gabapentin and amitriptyline.  Lab Results  Component Value Date   CREATININE 1.14 (H) 08/23/2020   BUN 25 08/23/2020   NA 140 08/23/2020   K 6.2 (H) 08/23/2020   CL 105 08/23/2020   CO2 17 (L) 08/23/2020   Lab Results  Component Value Date   CHOL 110 08/23/2020   HDL 40 08/23/2020   LDLCALC 47 08/23/2020   TRIG 128 08/23/2020   CHOLHDL 2.8 08/23/2020   Lab Results  Component Value Date   TSH 1.690 08/23/2020   Lab Results  Component Value Date   HGBA1C 6.6 (A) 04/03/2020   Lab Results  Component Value Date   WBC 9.7 08/23/2020   HGB 11.7 08/23/2020   HCT 37.0 08/23/2020   MCV 91 08/23/2020   PLT 322 08/23/2020   Lab Results  Component Value Date   ALT 22 08/23/2020   AST 21 08/23/2020   ALKPHOS 87 08/23/2020   BILITOT 0.3 08/23/2020     Review of Systems  Constitutional:  Negative for chills, fatigue and fever.  Eyes:  Positive for redness.  Respiratory:  Negative for chest tightness and shortness of breath.   Cardiovascular:  Negative for chest pain.  Skin:  Positive for itching and rash (with itching).   Patient Active Problem List    Diagnosis Date Noted   Oropharyngeal dysphagia 02/23/2019   Neuropathy due to type 2 diabetes mellitus ([place]) 08/20/2018   Microalbuminuria 04/30/2017   CKD (chronic kidney disease) stage 3, GFR 30-59 ml/min (HCC) 04/30/2017   Type II diabetes mellitus with complication ([place]) [id number]   Essential hypertension 03/19/2017   CAD (coronary artery disease) 03/19/2017   History of kidney stones 03/19/2017   Psoriasis 03/19/2017   Hyperlipidemia associated with type 2 diabetes mellitus ([place]) 03/19/2017   Nocturnal leg cramps 03/19/2017   Gait instability 03/19/2017   Insomnia 03/19/2017   Allergic rhinitis 03/19/2017    Allergies  Allergen Reactions   Prednisone Other (See Comments)    Intolerance  - confusion     Past Surgical History:  Procedure Laterality Date   BREAST BIOPSY Right    neg   CATARACT EXTRACTION W/PHACO Left 09/27/2019   Procedure: CATARACT EXTRACTION PHACO AND INTRAOCULAR LENS PLACEMENT ([place]) LEFT DIABETIC ISTENT INJ INTRAVITREAL KENALOG INJ;  Surgeon: Eulogio Bear, MD;  Location: [facility];  Service: Ophthalmology;  Laterality: Left;  2.83 0:28.7   CATARACT EXTRACTION W/PHACO Right 12/13/2019   Procedure: CATARACT EXTRACTION PHACO AND INTRAOCULAR LENS PLACEMENT ([place]) RIGHT ISTENT INJ KENALOG INJ DIABETIC;  Surgeon: Eulogio Bear, MD;  Location: [facility];  Service: Ophthalmology;  Laterality: Right;  1.39 0:21.5   CESAREAN SECTION  Social History   Tobacco Use   Smoking status: Never   Smokeless tobacco: Never  Vaping Use   Vaping Use: Never used  Substance Use Topics   Alcohol use: No   Drug use: No     Medication list has been reviewed and updated.  Current Meds  Medication Sig   acetaminophen (TYLENOL) 500 MG tablet Take 1,000 mg by mouth every 8 (eight) hours as needed.   Ascorbic Acid (VITAMIN C PO) Take by mouth.   aspirin EC 81 MG tablet Take 1 tablet (81 mg total) by mouth daily.   atorvastatin (LIPITOR)  80 MG tablet TAKE 1 TABLET BY MOUTH  DAILY   BD INSULIN SYRINGE U/F 30G X 1/2" 0.5 ML MISC USE WITH LANTUS INJECTIONS  FOR TYPE 2 DIABETES  MELLITUS   glimepiride (AMARYL) 2 MG tablet Take 2 mg by mouth daily.   glucose blood (ACCU-CHEK SMARTVIEW) test strip USE 3 TIMES DAILY   insulin glargine (LANTUS) 100 UNIT/ML injection Inject 0.4 mLs (40 Units total) into the skin every morning. Pt taking QAM (Patient taking differently: Inject 28 Units into the skin every morning. Pt taking QAM)   lidocaine (XYLOCAINE) 5 % ointment Apply topically.   losartan (COZAAR) 50 MG tablet TAKE 1 TABLET BY MOUTH  DAILY   metFORMIN (GLUCOPHAGE) 1000 MG tablet TAKE 1 TABLET BY MOUTH  TWICE DAILY WITH MEALS   metoprolol tartrate (LOPRESSOR) 50 MG tablet TAKE 1 TABLET BY MOUTH  TWICE DAILY   montelukast (SINGULAIR) 10 MG tablet TAKE 1 TABLET BY MOUTH  DAILY   Omega-3 Fatty Acids (FISH OIL) 1000 MG CAPS Take by mouth.   omeprazole (PRILOSEC) 20 MG capsule TAKE 1 CAPSULE BY MOUTH  DAILY   ondansetron (ZOFRAN-ODT) 4 MG disintegrating tablet Take 1 tablet (4 mg total) by mouth every 8 (eight) hours as needed for nausea or vomiting.   ONETOUCH DELICA PLUS LANCETS MISC 1 each by Does not apply route 2 (two) times daily.   Semaglutide, 1 MG/DOSE, (OZEMPIC, 1 MG/DOSE,) 4 MG/3ML SOPN Inject into the skin. Inject 1 mg into the skin every 7 days   traZODone (DESYREL) 50 MG tablet TAKE 1 TABLET BY MOUTH AT  BEDTIME   vitamin B-12 (CYANOCOBALAMIN) 1000 MCG tablet Take 1,000 mcg by mouth daily.    PHQ 2/9 Scores 10/16/2020 08/23/2020 06/08/2020 04/03/2020  PHQ - 2 Score 2 4 0 0  PHQ- 9 Score '7 11 2 1    '$ GAD 7 : Generalized Anxiety Score 08/23/2020 06/08/2020 04/03/2020 02/23/2020  Nervous, Anxious, on Edge 0 0 0 0  Control/stop worrying 0 0 1 0  Worry too much - different things 0 0 1 0  Trouble relaxing 0 0 0 0  Restless 0 0 0 0  Easily annoyed or irritable 0 0 0 0  Afraid - awful might happen 0 0 0 0  Total GAD 7 Score 0 0 2 0   Anxiety Difficulty - - - Not difficult at all    BP Readings from Last 3 Encounters:  11/03/20 128/74  10/16/20 110/70  08/23/20 106/78    Physical Exam Vitals and nursing note reviewed.  Constitutional:      General: She is not in acute distress.    Appearance: She is well-developed.  HENT:     Head: Normocephalic and atraumatic.  Pulmonary:     Effort: Pulmonary effort is normal. No respiratory distress.  Skin:    General: Skin is warm and dry.  Findings: Rash present.     Comments: Redness, peeling and warmth of the left side of face, forehead and around the right eye  Neurological:     Mental Status: She is alert and oriented to person, place, and time.  Psychiatric:        Mood and Affect: Mood normal.        Behavior: Behavior normal.    Wt Readings from Last 3 Encounters:  11/03/20 141 lb (64 kg)  10/16/20 141 lb 6.4 oz (64.1 kg)  08/23/20 139 lb (63 kg)    BP 128/74 (BP Location: Right Arm, Patient Position: Sitting, Cuff Size: Normal)   Pulse 97   Ht 5' (1.524 m)   Wt 141 lb (64 kg)   SpO2 98%   BMI 27.54 kg/m   Assessment and Plan: 1. Rash and nonspecific skin eruption Stop compounded medication and call Dermatology on Monday Continue Allegra, use Benadryl at bedtime, cool compresses and Cerave moisturizing lotion to face. Can also use saline eye drops as needed for eye comfort.   Partially dictated using Editor, commissioning. Any errors are unintentional.  Halina Maidens, MD [facility]  11/03/2020

## 2020-11-06 ENCOUNTER — Ambulatory Visit: Admit: 2020-11-06 | Discharge: 2020-11-07 | Payer: MEDICARE

## 2020-11-06 DIAGNOSIS — L259 Unspecified contact dermatitis, unspecified cause: Principal | ICD-10-CM

## 2020-11-06 DIAGNOSIS — L281 Prurigo nodularis: Principal | ICD-10-CM

## 2020-11-06 DIAGNOSIS — L409 Psoriasis, unspecified: Principal | ICD-10-CM

## 2020-11-06 MED ORDER — CLOBETASOL 0.05 % TOPICAL OINTMENT
Freq: Two times a day (BID) | TOPICAL | 1 refills | 0.00000 days | Status: CP
Start: 2020-11-06 — End: 2021-11-06

## 2020-11-06 MED ORDER — HYDROCORTISONE 2.5 % TOPICAL OINTMENT
Freq: Two times a day (BID) | TOPICAL | 1 refills | 0.00000 days | Status: CP
Start: 2020-11-06 — End: 2021-11-06

## 2020-11-06 MED ORDER — CLOBETASOL 0.05 % SCALP SOLUTION
Freq: Two times a day (BID) | TOPICAL | 0 refills | 0.00000 days | Status: CP
Start: 2020-11-06 — End: 2021-11-06

## 2020-11-14 ENCOUNTER — Other Ambulatory Visit: Payer: Self-pay | Admitting: Internal Medicine

## 2020-11-14 DIAGNOSIS — E118 Type 2 diabetes mellitus with unspecified complications: Secondary | ICD-10-CM

## 2020-11-20 ENCOUNTER — Other Ambulatory Visit: Payer: Self-pay | Admitting: Internal Medicine

## 2020-11-20 NOTE — Telephone Encounter (Signed)
Requested medications are due for refill today.  yes  Requested medications are on the active medications list.  yes  Last refill. 10/16/2020  Future visit scheduled.   yes  Notes to clinic.  Rx signed by historical provider.

## 2021-01-22 ENCOUNTER — Other Ambulatory Visit: Payer: Self-pay | Admitting: Internal Medicine

## 2021-01-23 ENCOUNTER — Other Ambulatory Visit: Payer: Self-pay

## 2021-01-23 NOTE — Telephone Encounter (Signed)
Requested medications are due for refill today yes  Requested medications are on the active medication list yes  Last refill 11/21/20  Last visit 08/23/20  Future visit scheduled 02/23/21  Notes to clinic Failed protocol of HgA1C within 180 days, has upcoming appt, please assess. Requested Prescriptions  Pending Prescriptions Disp Refills   glimepiride (AMARYL) 2 MG tablet [Pharmacy Med Name: Glimepiride 2 MG Oral Tablet] 90 tablet 3    Sig: TAKE 1 TABLET BY MOUTH  DAILY WITH BREAKFAST     Endocrinology:  Diabetes - Sulfonylureas Failed - 01/22/2021  4:22 AM      Failed - HBA1C is between 0 and 7.9 and within 180 days    Hemoglobin A1C  Date Value Ref Range Status  04/03/2020 6.6 (A) 4.0 - 5.6 % Final  12/20/2019 6.6  Final          Passed - Valid encounter within last 6 months    Recent Outpatient Visits           2 months ago Rash and nonspecific skin eruption   [facility] Glean Hess, [facility]   5 months ago Annual physical exam   [facility] Glean Hess, MD   7 months ago Vertigo   [facility] Glean Hess, MD   9 months ago Type II diabetes mellitus with complication [facility])   [facility] Glean Hess, [facility]   11 months ago Essential hypertension   [facility] Glean Hess, MD       Future Appointments             In 1 month Army Melia Jesse Sans, MD [facility], [place]   In 7 months Army Melia, Jesse Sans, MD [facility], [facility]

## 2021-01-26 DIAGNOSIS — I152 Hypertension secondary to endocrine disorders: Secondary | ICD-10-CM | POA: Diagnosis not present

## 2021-01-26 DIAGNOSIS — E1159 Type 2 diabetes mellitus with other circulatory complications: Secondary | ICD-10-CM | POA: Diagnosis not present

## 2021-01-26 DIAGNOSIS — E1142 Type 2 diabetes mellitus with diabetic polyneuropathy: Secondary | ICD-10-CM | POA: Diagnosis not present

## 2021-01-26 DIAGNOSIS — Z794 Long term (current) use of insulin: Secondary | ICD-10-CM | POA: Diagnosis not present

## 2021-01-26 DIAGNOSIS — E1169 Type 2 diabetes mellitus with other specified complication: Secondary | ICD-10-CM | POA: Diagnosis not present

## 2021-01-26 DIAGNOSIS — E785 Hyperlipidemia, unspecified: Secondary | ICD-10-CM | POA: Diagnosis not present

## 2021-01-26 LAB — HM DIABETES FOOT EXAM: HM Diabetic Foot Exam: NORMAL

## 2021-01-26 LAB — HEMOGLOBIN A1C: Hemoglobin A1C: 7.2

## 2021-02-01 ENCOUNTER — Other Ambulatory Visit: Payer: Self-pay | Admitting: Internal Medicine

## 2021-02-01 DIAGNOSIS — E785 Hyperlipidemia, unspecified: Secondary | ICD-10-CM

## 2021-02-01 NOTE — Telephone Encounter (Signed)
Requested Prescriptions  Pending Prescriptions Disp Refills  . atorvastatin (LIPITOR) 80 MG tablet [Pharmacy Med Name: Atorvastatin Calcium 80 MG Oral Tablet] 90 tablet 1    Sig: TAKE 1 TABLET BY MOUTH  DAILY     Cardiovascular:  Antilipid - Statins Passed - 02/01/2021  4:45 AM      Passed - Total Cholesterol in normal range and within 360 days    Cholesterol, Total  Date Value Ref Range Status  08/23/2020 110 100 - 199 mg/dL Final         Passed - LDL in normal range and within 360 days    LDL Chol Calc (NIH)  Date Value Ref Range Status  08/23/2020 47 0 - 99 mg/dL Final         Passed - HDL in normal range and within 360 days    HDL  Date Value Ref Range Status  08/23/2020 40 >39 mg/dL Final         Passed - Triglycerides in normal range and within 360 days    Triglycerides  Date Value Ref Range Status  08/23/2020 128 0 - 149 mg/dL Final         Passed - Patient is not pregnant      Passed - Valid encounter within last 12 months    Recent Outpatient Visits          3 months ago Rash and nonspecific skin eruption   [facility] Gle[facility], [facility]   5 months ago Annual physical exam   [facility] Glean Hess, MD   7 months ago Vertigo   [facility] Glean Hess, MD   10 months ago Type II diabetes mellitus with complication [facility])   [facility] Glean Hess, MD   11 months ago Essential hypertension   [facility] Glean Hess, MD      Future Appointments            In 3 weeks Army Melia Jesse Sans, MD [facility], [place]   In 6 months Army Melia, Jesse Sans, MD [facility], [facility]

## 2021-02-07 ENCOUNTER — Other Ambulatory Visit: Payer: Self-pay | Admitting: Internal Medicine

## 2021-02-07 DIAGNOSIS — E1140 Type 2 diabetes mellitus with diabetic neuropathy, unspecified: Secondary | ICD-10-CM

## 2021-02-07 NOTE — Telephone Encounter (Signed)
Requested medication (s) are due for refill today: yes  Requested medication (s) are on the active medication list: yes  Last refill:  08/24/20 #180/1 RF  Future visit scheduled: Yes  Notes to clinic:  Unable to refill per protocol due to failed labs, no updated results.      Requested Prescriptions  Pending Prescriptions Disp Refills   metFORMIN (GLUCOPHAGE) 1000 MG tablet [Pharmacy Med Name: metFORMIN HCl 1000 MG Oral Tablet] 180 tablet 3    Sig: TAKE 1 TABLET BY MOUTH  TWICE DAILY WITH MEALS     Endocrinology:  Diabetes - Biguanides Failed - 02/07/2021  6:59 AM      Failed - Cr in normal range and within 360 days    Creatinine, Ser  Date Value Ref Range Status  08/23/2020 1.14 (H) 0.57 - 1.00 mg/dL Final          Failed - HBA1C is between 0 and 7.9 and within 180 days    Hemoglobin A1C  Date Value Ref Range Status  04/03/2020 6.6 (A) 4.0 - 5.6 % Final  12/20/2019 6.6  Final          Failed - eGFR in normal range and within 360 days    GFR calc Af Amer  Date Value Ref Range Status  08/23/2019 >60 >60 mL/min Final   GFR calc non Af Amer  Date Value Ref Range Status  08/23/2019 53 (L) >60 mL/min Final   eGFR  Date Value Ref Range Status  08/23/2020 52 (L) >59 mL/min/1.73 Final          Passed - Valid encounter within last 6 months    Recent Outpatient Visits           3 months ago Rash and nonspecific skin eruption   [facility] Glean Hess, MD   5 months ago Annual physical exam   [facility], [facility]   8 months ago Vertigo   [facility] Gle[facility], [facility]   10 months ago Type II diabetes mellitus with complication [facility])   [facility] Glean Hess, MD   11 months ago Essential hypertension   [facility] Glean Hess, MD       Future Appointments             In 2 weeks Army Melia Jesse Sans, MD [facility], [place]   In 6 months Army Melia, Jesse Sans, MD [facility], [facility]

## 2021-02-13 ENCOUNTER — Other Ambulatory Visit: Payer: Self-pay | Admitting: Internal Medicine

## 2021-02-13 DIAGNOSIS — J309 Allergic rhinitis, unspecified: Secondary | ICD-10-CM

## 2021-02-14 NOTE — Telephone Encounter (Signed)
Requested Prescriptions  Pending Prescriptions Disp Refills   montelukast (SINGULAIR) 10 MG tablet [Pharmacy Med Name: Montelukast Sodium 10 MG Oral Tablet] 90 tablet 1    Sig: TAKE 1 TABLET BY MOUTH  DAILY     Pulmonology:  Leukotriene Inhibitors Passed - 02/13/2021 10:22 PM      Passed - Valid encounter within last 12 months    Recent Outpatient Visits          3 months ago Rash and nonspecific skin eruption   [facility] Glean Hess, MD   5 months ago Annual physical exam   [facility] Glean Hess, MD   8 months ago Vertigo   [facility] Glean Hess, MD   10 months ago Type II diabetes mellitus with complication [facility])   [facility], [facility]   11 months ago Essential hypertension   [facility] Glean Hess, MD      Future Appointments            In 1 week Army Melia Jesse Sans, MD [facility], [place]   In 6 months Army Melia, Jesse Sans, MD [facility], [employer] [facility]

## 2021-02-23 ENCOUNTER — Other Ambulatory Visit: Payer: Self-pay

## 2021-02-23 ENCOUNTER — Ambulatory Visit (INDEPENDENT_AMBULATORY_CARE_PROVIDER_SITE_OTHER): Payer: Medicare Other | Admitting: Internal Medicine

## 2021-02-23 ENCOUNTER — Encounter: Payer: Self-pay | Admitting: Internal Medicine

## 2021-02-23 VITALS — BP 112/78 | HR 112 | Ht 60.0 in | Wt 137.6 lb

## 2021-02-23 DIAGNOSIS — I2510 Atherosclerotic heart disease of native coronary artery without angina pectoris: Secondary | ICD-10-CM | POA: Diagnosis not present

## 2021-02-23 DIAGNOSIS — I10 Essential (primary) hypertension: Secondary | ICD-10-CM | POA: Diagnosis not present

## 2021-02-23 DIAGNOSIS — E118 Type 2 diabetes mellitus with unspecified complications: Secondary | ICD-10-CM | POA: Diagnosis not present

## 2021-02-23 MED ORDER — METOPROLOL TARTRATE 50 MG PO TABS: 50.0000 mg | ORAL_TABLET | Freq: Two times a day (BID) | ORAL | 1 refills | Status: DC

## 2021-02-23 MED ORDER — METFORMIN HCL 1000 MG PO TABS: 1000.0000 mg | ORAL_TABLET | Freq: Two times a day (BID) | ORAL | 1 refills | Status: DC

## 2021-02-23 MED ORDER — LOSARTAN POTASSIUM 25 MG PO TABS: 25.0000 mg | ORAL_TABLET | Freq: Every day | ORAL | 0 refills | Status: DC

## 2021-02-23 NOTE — Progress Notes (Signed)
Date:  02/23/2021   Name:  Elizabeth Klein   DOB:  1950/06/06   MRN:  [id number]   Chief Complaint: Hypertension and Diabetes  Diabetes She presents for her follow-up diabetic visit. She has type 2 diabetes mellitus. Her disease course has been stable. Pertinent negatives for hypoglycemia include no dizziness or headaches. Pertinent negatives for diabetes include no chest pain, no fatigue and no weakness. Current diabetic treatment includes insulin injections and oral agent (dual therapy). She is compliant with treatment all of the time. She is following a generally healthy diet. She rarely participates in exercise. An ACE inhibitor/angiotensin II receptor blocker is being taken.  Hypertension This is a chronic problem. The problem is controlled. Pertinent negatives include no chest pain, headaches, palpitations or shortness of breath. Past treatments include beta blockers (and losartan cut to 25 mg 3 weeks ago).   Lab Results  Component Value Date   NA 140 08/23/2020   K 6.2 (H) 08/23/2020   CO2 17 (L) 08/23/2020   GLUCOSE 103 (H) 08/23/2020   BUN 25 08/23/2020   CREATININE 1.14 (H) 08/23/2020   CALCIUM 9.6 08/23/2020   EGFR 52 (L) 08/23/2020   GFRNONAA 53 (L) 08/23/2019   Lab Results  Component Value Date   CHOL 110 08/23/2020   HDL 40 08/23/2020   LDLCALC 47 08/23/2020   TRIG 128 08/23/2020   CHOLHDL 2.8 08/23/2020   Lab Results  Component Value Date   TSH 1.690 08/23/2020   Lab Results  Component Value Date   HGBA1C 7.2 01/26/2021   Lab Results  Component Value Date   WBC 9.7 08/23/2020   HGB 11.7 08/23/2020   HCT 37.0 08/23/2020   MCV 91 08/23/2020   PLT 322 08/23/2020   Lab Results  Component Value Date   ALT 22 08/23/2020   AST 21 08/23/2020   ALKPHOS 87 08/23/2020   BILITOT 0.3 08/23/2020   Lab Results  Component Value Date   VD25OH 32.6 06/02/2017     Review of Systems  Constitutional:  Negative for fatigue and unexpected weight change.   HENT:  Negative for nosebleeds.   Eyes:  Negative for visual disturbance.  Respiratory:  Negative for cough, chest tightness, shortness of breath and wheezing.   Cardiovascular:  Negative for chest pain, palpitations and leg swelling.  Gastrointestinal:  Negative for abdominal pain, constipation and diarrhea.  Neurological:  Negative for dizziness, weakness, light-headedness and headaches.   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   Microalbuminuria 04/30/2017   CKD (chronic kidney disease) stage 3, GFR 30-59 ml/min (HCC) 04/30/2017   Type II diabetes mellitus with complication ([place]) 20/81/3887   Essential hypertension 03/19/2017   CAD (coronary artery disease) 03/19/2017   History of kidney stones 03/19/2017   Psoriasis 03/19/2017   Hyperlipidemia associated with type 2 diabetes mellitus ([place]) 03/19/2017   Nocturnal leg cramps 03/19/2017   Gait instability 03/19/2017   Insomnia 03/19/2017   Allergic rhinitis 03/19/2017    Allergies  Allergen Reactions   Prednisone Other (See Comments)    Intolerance  - confusion     Past Surgical History:  Procedure Laterality Date   BREAST BIOPSY Right    neg   CATARACT EXTRACTION W/PHACO Left 09/27/2019   Procedure: CATARACT EXTRACTION PHACO AND INTRAOCULAR LENS PLACEMENT ([place]) LEFT DIABETIC ISTENT INJ INTRAVITREAL KENALOG INJ;  Surgeon: Eulogio Bear, MD;  Location[facility];  Service: Ophthalmology;  Laterality: Left;  2.83 0:28.7   CATARACT EXTRACTION W/PHACO Right 12/13/2019   Procedure: CATARACT EXTRACTION PHACO AND INTRAOCULAR LENS PLACEMENT ([place]) RIGHT ISTENT INJ KENALOG INJ DIABETIC;  Surgeon: Eulogio Bear, MD;  Location: [place];  Service: Ophthalmology;  Laterality: Right;  1.39 0:21.5   CESAREAN SECTION      Social History   Tobacco Use   Smoking status: Never   Smokeless  tobacco: Never  Vaping Use   Vaping Use: Never used  Substance Use Topics   Alcohol use: No   Drug use: No     Medication list has been reviewed and updated.  Current Meds  Medication Sig   acetaminophen (TYLENOL) 500 MG tablet Take 1,000 mg by mouth every 8 (eight) hours as needed.   Ascorbic Acid (VITAMIN C PO) Take by mouth.   aspirin EC 81 MG tablet Take 1 tablet (81 mg total) by mouth daily.   atorvastatin (LIPITOR) 80 MG tablet TAKE 1 TABLET BY MOUTH  DAILY   BD INSULIN SYRINGE U/F 30G X 1/2" 0.5 ML MISC USE WITH LANTUS INJECTIONS  FOR TYPE 2 DIABETES  MELLITUS   clobetasol (TEMOVATE) 0.05 % external solution Apply topically.   clobetasol ointment (TEMOVATE) 9.23 % Apply 1 application topically 2 (two) times daily.   glimepiride (AMARYL) 2 MG tablet TAKE 1 TABLET BY MOUTH  DAILY WITH BREAKFAST   glucose blood (ACCU-CHEK SMARTVIEW) test strip USE 3 TIMES DAILY   hydrocortisone 2.5 % ointment Apply topically.   insulin glargine (LANTUS) 100 UNIT/ML injection Inject 0.4 mLs (40 Units total) into the skin every morning. Pt taking QAM (Patient taking differently: Inject 28 Units into the skin every morning. Pt taking QAM)   lidocaine (XYLOCAINE) 5 % ointment Apply topically.   losartan (COZAAR) 50 MG tablet TAKE 1 TABLET BY MOUTH  DAILY (Patient taking differently: Take 25 mg by mouth daily.)   metFORMIN (GLUCOPHAGE) 1000 MG tablet TAKE 1 TABLET BY MOUTH  TWICE DAILY WITH MEALS   metoprolol tartrate (LOPRESSOR) 50 MG tablet TAKE 1 TABLET BY MOUTH  TWICE DAILY   montelukast (SINGULAIR) 10 MG tablet TAKE 1 TABLET BY MOUTH  DAILY   Omega-3 Fatty Acids (FISH OIL) 1000 MG CAPS Take by mouth.   omeprazole (PRILOSEC) 20 MG capsule TAKE 1 CAPSULE BY MOUTH  DAILY   ondansetron (ZOFRAN-ODT) 4 MG disintegrating tablet Take 1 tablet (4 mg total) by mouth every 8 (eight) hours as needed for nausea or vomiting.   ONETOUCH DELICA PLUS LANCETS MISC 1 each by Does not apply route 2 (two) times daily.    Semaglutide, 1 MG/DOSE, (OZEMPIC, 1 MG/DOSE,) 4 MG/3ML SOPN Inject into the skin. Inject 1 mg into the skin every 7 days   traZODone (DESYREL) 50 MG tablet TAKE 1 TABLET BY MOUTH AT  BEDTIME   vitamin B-12 (CYANOCOBALAMIN) 1000 MCG tablet Take 1,000 mcg by mouth daily.    PHQ 2/9 Scores 02/23/2021 11/03/2020 10/16/2020 08/23/2020  PHQ - 2 Score _0 PHQ- 9 Score _1 GAD 7 : Generalized Anxiety Score 02/23/2021 11/03/2020 08/23/2020 06/08/2020  Nervous, Anxious, on Edge 1 1 0 0  Control/stop worrying 1 1 0 0  Worry too much - different things 1 1 0 0  Trouble relaxing 0 1 0 0  Restless 0 0 0 0  Easily annoyed or irritable 1 1 0 0  Afraid - awful might happen 0 1  0 0  Total GAD 7 Score 4 6 0 0  Anxiety Difficulty Not difficult at all Somewhat difficult - -    BP Readings from Last 3 Encounters:  02/23/21 112/78  11/03/20 128/74  10/16/20 110/70    Physical Exam Vitals and nursing note reviewed.  Constitutional:      General: She is not in acute distress.    Appearance: Normal appearance. She is well-developed.  HENT:     Head: Normocephalic and atraumatic.  Cardiovascular:     Rate and Rhythm: Normal rate and regular rhythm.     Heart sounds: No murmur heard. Pulmonary:     Effort: Pulmonary effort is normal. No respiratory distress.     Breath sounds: No wheezing or rhonchi.  Musculoskeletal:     Cervical back: Normal range of motion.     Right lower leg: No edema.     Left lower leg: No edema.  Skin:    General: Skin is warm and dry.     Capillary Refill: Capillary refill takes less than 2 seconds.     Findings: No rash.  Neurological:     General: No focal deficit present.     Mental Status: She is alert and oriented to person, place, and time.  Psychiatric:        Mood and Affect: Mood normal.        Behavior: Behavior normal.    Wt Readings from Last 3 Encounters:  02/23/21 137 lb 9.6 oz (62.4 kg)  11/03/20 141 lb (64 kg)  10/16/20 141 lb 6.4 oz  (64.1 kg)    BP 112/78    Pulse (!) 112    Ht 5' (1.524 m)    Wt 137 lb 9.6 oz (62.4 kg)    SpO2 98%    BMI 26.87 kg/m   Assessment and Plan: 1. Essential hypertension Clinically stable exam with well controlled BP. Tolerating medications without side effects at this time. Pt to continue current regimen and low sodium diet; benefits of regular exercise as able discussed. - metoprolol tartrate (LOPRESSOR) 50 MG tablet; Take 1 tablet (50 mg total) by mouth 2 (two) times daily.  Dispense: 180 tablet; Refill: 1  2. Type II diabetes mellitus with complication (HCC) Clinically stable by exam and report without s/s of hypoglycemia. Managed by Endo. DM complicated by hypertension and dyslipidemia. Tolerating medications well without side effects or other concerns. - Microalbumin / creatinine urine ratio - metFORMIN (GLUCOPHAGE) 1000 MG tablet; Take 1 tablet (1,000 mg total) by mouth 2 (two) times daily with a meal.  Dispense: 180 tablet; Refill: 1 - losartan (COZAAR) 25 MG tablet; Take 1 tablet (25 mg total) by mouth daily.  Dispense: 90 tablet; Refill: 0  3. Coronary artery disease involving native coronary artery of native heart without angina pectoris Asymptomatic with excellent lipids, BP control. - metoprolol tartrate (LOPRESSOR) 50 MG tablet; Take 1 tablet (50 mg total) by mouth 2 (two) times daily.  Dispense: 180 tablet; Refill: 1   Partially dictated using Editor, commissioning. Any errors are unintentional.  Halina Maidens, MD [facility]  02/23/2021

## 2021-02-23 NOTE — Patient Instructions (Signed)
104    Sodium 136 - 145 mmol/L 138   Potassium 3.6 - 5.1 mmol/L 5.7 High    Chloride 97 - 109 mmol/L 108   Carbon Dioxide (CO2) 22.0 - 32.0 mmol/L 22.9   Calcium 8.7 - 10.3 mg/dL 9.6   Urea Nitrogen (BUN) 7 - 25 mg/dL 29 High    Creatinine 0.6 - 1.1 mg/dL 1.0   Glomerular Filtration Rate (eGFR), MDRD Estimate >60 mL/min/1.73sq m 55 Low    BUN/Crea Ratio 6.0 - 20.0 29.0 High

## 2021-02-24 LAB — MICROALBUMIN / CREATININE URINE RATIO
Creatinine, Urine: 415.3 mg/dL
Microalb/Creat Ratio: 369 mg/g creat — ABNORMAL HIGH (ref 0–29)
Microalbumin, Urine: 1533 ug/mL

## 2021-03-01 ENCOUNTER — Other Ambulatory Visit: Payer: Self-pay | Admitting: Internal Medicine

## 2021-03-01 DIAGNOSIS — K219 Gastro-esophageal reflux disease without esophagitis: Secondary | ICD-10-CM

## 2021-03-01 NOTE — Telephone Encounter (Signed)
Future OV 08/27/21 Approved for 90 day supply per protocol.  Requested Prescriptions  Pending Prescriptions Disp Refills   omeprazole (PRILOSEC) 20 MG capsule [Pharmacy Med Name: Omeprazole 20 MG Oral Capsule Delayed Release] 90 capsule 0    Sig: TAKE 1 CAPSULE BY MOUTH  DAILY     Gastroenterology: Proton Pump Inhibitors Passed - 03/01/2021 12:57 AM      Passed - Valid encounter within last 12 months    Recent Outpatient Visits          6 days ago Essential hypertension   [facility] Glean Hess, MD   3 months ago Rash and nonspecific skin eruption   [facility] Glean Hess, MD   6 months ago Annual physical exam   [facility] Glean Hess, MD   8 months ago Vertigo   [facility] Glean Hess, MD   11 months ago Type II diabetes mellitus with complication [facility])   [facility] Glean Hess, MD      Future Appointments            In 5 months Army Melia Jesse Sans, MD [facility], [facility]

## 2021-03-06 ENCOUNTER — Other Ambulatory Visit: Payer: Self-pay | Admitting: Internal Medicine

## 2021-03-06 DIAGNOSIS — G4700 Insomnia, unspecified: Secondary | ICD-10-CM

## 2021-03-06 NOTE — Telephone Encounter (Signed)
Requested Prescriptions  Pending Prescriptions Disp Refills   traZODone (DESYREL) 50 MG tablet [Pharmacy Med Name: traZODone HCl 50 MG Oral Tablet] 90 tablet 3    Sig: TAKE 1 TABLET BY MOUTH AT  BEDTIME     Psychiatry: Antidepressants - Serotonin Modulator Passed - 03/06/2021 11:22 AM      Passed - Valid encounter within last 6 months    Recent Outpatient Visits          1 week ago Essential hypertension   [facility] Glean Hess, MD   4 months ago Rash and nonspecific skin eruption   [facility] Glean Hess, MD   6 months ago Annual physical exam   [facility] Glean Hess, MD   9 months ago [facility] Glean Hess, MD   11 months ago Type II diabetes mellitus with complication [facility])   [facility] Glean Hess, MD      Future Appointments            In 5 months Army Melia Jesse Sans, MD [facility], [facility]

## 2021-04-06 DIAGNOSIS — E113212 Type 2 diabetes mellitus with mild nonproliferative diabetic retinopathy with macular edema, left eye: Secondary | ICD-10-CM | POA: Diagnosis not present

## 2021-04-11 ENCOUNTER — Ambulatory Visit (INDEPENDENT_AMBULATORY_CARE_PROVIDER_SITE_OTHER): Payer: Medicare Other | Admitting: Internal Medicine

## 2021-04-11 ENCOUNTER — Encounter: Payer: Self-pay | Admitting: Internal Medicine

## 2021-04-11 ENCOUNTER — Telehealth: Payer: Self-pay | Admitting: Internal Medicine

## 2021-04-11 ENCOUNTER — Other Ambulatory Visit: Payer: Self-pay

## 2021-04-11 VITALS — BP 112/64 | HR 112 | Temp 99.2°F | Ht 60.0 in | Wt 138.0 lb

## 2021-04-11 DIAGNOSIS — L409 Psoriasis, unspecified: Secondary | ICD-10-CM | POA: Diagnosis not present

## 2021-04-11 DIAGNOSIS — R051 Acute cough: Secondary | ICD-10-CM

## 2021-04-11 LAB — POC COVID19 BINAXNOW: SARS Coronavirus 2 Ag: NEGATIVE

## 2021-04-11 MED ORDER — PROMETHAZINE-DM 6.25-15 MG/5ML PO SYRP: 5.0000 mL | ORAL_SOLUTION | Freq: Four times a day (QID) | ORAL | 0 refills | Status: DC | PRN

## 2021-04-11 MED ORDER — AZITHROMYCIN 250 MG PO TABS: ORAL_TABLET | ORAL | 0 refills | Status: AC

## 2021-04-11 MED ORDER — ALBUTEROL SULFATE HFA 108 (90 BASE) MCG/ACT IN AERS: 2.0000 | INHALATION_SPRAY | Freq: Four times a day (QID) | RESPIRATORY_TRACT | 0 refills | Status: DC | PRN

## 2021-04-11 MED ORDER — HYDROCORTISONE 2.5 % EX OINT: TOPICAL_OINTMENT | Freq: Two times a day (BID) | CUTANEOUS | 0 refills | Status: AC

## 2021-04-11 MED ORDER — CLOBETASOL PROPIONATE 0.05 % EX SOLN: Freq: Two times a day (BID) | CUTANEOUS | 0 refills | Status: DC

## 2021-04-11 NOTE — Telephone Encounter (Signed)
Patient called in states received Ozempic medication form [facility] she dindt order, so she isnt going to take it

## 2021-04-11 NOTE — Progress Notes (Signed)
Date:  04/11/2021   Name:  Elizabeth Klein   DOB:  Jun 12, 1950   MRN:  [id number]   Chief Complaint: Cough  URI  This is a new problem. The current episode started in the past 7 days. The problem has been gradually worsening. There has been no fever. Associated symptoms include congestion, coughing (Yellow Mucous.) and headaches. Pertinent negatives include no chest pain, diarrhea, nausea or sore throat.   Lab Results  Component Value Date   NA 140 08/23/2020   K 6.2 (H) 08/23/2020   CO2 17 (L) 08/23/2020   GLUCOSE 103 (H) 08/23/2020   BUN 25 08/23/2020   CREATININE 1.14 (H) 08/23/2020   CALCIUM 9.6 08/23/2020   EGFR 52 (L) 08/23/2020   GFRNONAA 53 (L) 08/23/2019   Lab Results  Component Value Date   CHOL 110 08/23/2020   HDL 40 08/23/2020   LDLCALC 47 08/23/2020   TRIG 128 08/23/2020   CHOLHDL 2.8 08/23/2020   Lab Results  Component Value Date   TSH 1.690 08/23/2020   Lab Results  Component Value Date   HGBA1C 7.2 01/26/2021   Lab Results  Component Value Date   WBC 9.7 08/23/2020   HGB 11.7 08/23/2020   HCT 37.0 08/23/2020   MCV 91 08/23/2020   PLT 322 08/23/2020   Lab Results  Component Value Date   ALT 22 08/23/2020   AST 21 08/23/2020   ALKPHOS 87 08/23/2020   BILITOT 0.3 08/23/2020   Lab Results  Component Value Date   VD25OH 32.6 06/02/2017     Review of Systems  Constitutional:  Positive for chills. Negative for fatigue and fever.  HENT:  Positive for congestion. Negative for sore throat and trouble swallowing.   Respiratory:  Positive for cough (Yellow Mucous.).   Cardiovascular:  Negative for chest pain and palpitations.  Gastrointestinal:  Negative for diarrhea and nausea.  Neurological:  Positive for headaches. Negative for dizziness.   Patient Active Problem List   Diagnosis Date Noted   Oropharyngeal dysphagia 02/23/2019   Neuropathy due to type 2 diabetes mellitus ([place]) 08/20/2018   Microalbuminuria 04/30/2017   CKD (chronic  kidney disease) stage 3, GFR 30-59 ml/min (HCC) 04/30/2017   Type II diabetes mellitus with complication ([place]) 14/43/1540   Essential hypertension 03/19/2017   CAD (coronary artery disease) 03/19/2017   History of kidney stones 03/19/2017   Psoriasis 03/19/2017   Hyperlipidemia associated with type 2 diabetes mellitus ([place]) 03/19/2017   Nocturnal leg cramps 03/19/2017   Gait instability 03/19/2017   Insomnia 03/19/2017   Allergic rhinitis 03/19/2017    Allergies  Allergen Reactions   Prednisone Other (See Comments)    Intolerance  - confusion     Past Surgical History:  Procedure Laterality Date   BREAST BIOPSY Right    neg   CATARACT EXTRACTION W/PHACO Left 09/27/2019   Procedure: CATARACT EXTRACTION PHACO AND INTRAOCULAR LENS PLACEMENT ([place]) LEFT DIABETIC ISTENT INJ INTRAVITREAL KENALOG INJ;  Surgeon: Eulogio Bear, MD;  Location: [facility];  Service: Ophthalmology;  Laterality: Left;  2.83 0:28.7   CATARACT EXTRACTION W/PHACO Right 12/13/2019   Procedure: CATARACT EXTRACTION PHACO AND INTRAOCULAR LENS PLACEMENT ([place]) RIGHT ISTENT INJ KENALOG INJ DIABETIC;  Surgeon: Eulogio Bear, MD;  Location: [facility];  Service: Ophthalmology;  Laterality: Right;  1.39 0:21.5   CESAREAN SECTION      Social History   Tobacco Use   Smoking status: Never   Smokeless tobacco: Never  Vaping Use  Vaping Use: Never used  Substance Use Topics   Alcohol use: No   Drug use: No     Medication list has been reviewed and updated.  Current Meds  Medication Sig   acetaminophen (TYLENOL) 500 MG tablet Take 1,000 mg by mouth every 8 (eight) hours as needed.   Ascorbic Acid (VITAMIN C PO) Take by mouth.   aspirin EC 81 MG tablet Take 1 tablet (81 mg total) by mouth daily.   atorvastatin (LIPITOR) 80 MG tablet TAKE 1 TABLET BY MOUTH  DAILY   BD INSULIN SYRINGE U/F 30G X 1/2" 0.5 ML MISC USE WITH LANTUS INJECTIONS  FOR TYPE 2 DIABETES  MELLITUS   clobetasol  (TEMOVATE) 0.05 % external solution Apply topically.   clobetasol ointment (TEMOVATE) 5.72 % Apply 1 application topically 2 (two) times daily.   dextromethorphan-guaiFENesin (MUCINEX DM) 30-600 MG 12hr tablet Take 1 tablet by mouth 2 (two) times daily.   glimepiride (AMARYL) 2 MG tablet TAKE 1 TABLET BY MOUTH  DAILY WITH BREAKFAST   glucose blood (ACCU-CHEK SMARTVIEW) test strip USE 3 TIMES DAILY   hydrocortisone 2.5 % ointment Apply topically.   insulin glargine (LANTUS) 100 UNIT/ML injection Inject 0.4 mLs (40 Units total) into the skin every morning. Pt taking QAM (Patient taking differently: Inject 27 Units into the skin every morning. Pt taking QAM)   lidocaine (XYLOCAINE) 5 % ointment Apply topically.   loratadine (CLARITIN) 10 MG tablet Take 10 mg by mouth daily.   losartan (COZAAR) 25 MG tablet Take 1 tablet (25 mg total) by mouth daily.   metFORMIN (GLUCOPHAGE) 1000 MG tablet Take 1 tablet (1,000 mg total) by mouth 2 (two) times daily with a meal.   metoprolol tartrate (LOPRESSOR) 50 MG tablet Take 1 tablet (50 mg total) by mouth 2 (two) times daily.   montelukast (SINGULAIR) 10 MG tablet TAKE 1 TABLET BY MOUTH  DAILY   Omega-3 Fatty Acids (FISH OIL) 1000 MG CAPS Take by mouth.   omeprazole (PRILOSEC) 20 MG capsule TAKE 1 CAPSULE BY MOUTH  DAILY   ONETOUCH DELICA PLUS LANCETS MISC 1 each by Does not apply route 2 (two) times daily.   Semaglutide, 1 MG/DOSE, (OZEMPIC, 1 MG/DOSE,) 4 MG/3ML SOPN Inject into the skin. Inject 1 mg into the skin every 7 days   traZODone (DESYREL) 50 MG tablet TAKE 1 TABLET BY MOUTH AT  BEDTIME   vitamin B-12 (CYANOCOBALAMIN) 1000 MCG tablet Take 1,000 mcg by mouth daily.    PHQ 2/9 Scores 04/11/2021 02/23/2021 11/03/2020 10/16/2020  PHQ - 2 Score 2 1 2 2   PHQ- 9 Score 11 4 6 7     GAD 7 : Generalized Anxiety Score 04/11/2021 02/23/2021 11/03/2020 08/23/2020  Nervous, Anxious, on Edge 1 1 1  0  Control/stop worrying 1 1 1  0  Worry too much - different things 1  1 1  0  Trouble relaxing 1 0 1 0  Restless 1 0 0 0  Easily annoyed or irritable 1 1 1  0  Afraid - awful might happen 1 0 1 0  Total GAD 7 Score 7 4 6  0  Anxiety Difficulty Not difficult at all Not difficult at all Somewhat difficult -    BP Readings from Last 3 Encounters:  04/11/21 112/64  02/23/21 112/78  11/03/20 128/74    Physical Exam Vitals and nursing note reviewed.  Constitutional:      General: She is not in acute distress.    Appearance: Normal appearance. She is well-developed.  HENT:  Head: Normocephalic and atraumatic.  Cardiovascular:     Rate and Rhythm: Normal rate and regular rhythm.     Pulses: Normal pulses.     Heart sounds: No murmur heard. Pulmonary:     Effort: Pulmonary effort is normal. No respiratory distress.     Breath sounds: Decreased breath sounds present. No wheezing or rhonchi.  Musculoskeletal:     Cervical back: Normal range of motion.  Lymphadenopathy:     Cervical: No cervical adenopathy.  Skin:    General: Skin is warm and dry.     Findings: No rash.  Neurological:     Mental Status: She is alert and oriented to person, place, and time.  Psychiatric:        Mood and Affect: Mood normal.        Behavior: Behavior normal.    Wt Readings from Last 3 Encounters:  04/11/21 138 lb (62.6 kg)  02/23/21 137 lb 9.6 oz (62.4 kg)  11/03/20 141 lb (64 kg)    BP 112/64    Pulse (!) 112    Temp 99.2 F (37.3 C) (Oral)    Ht 5' (1.524 m)    Wt 138 lb (62.6 kg)    SpO2 97%    BMI 26.95 kg/m   Assessment and Plan: 1. Acute cough Due to URI and now early bronchitis - azithromycin (ZITHROMAX Z-PAK) 250 MG tablet; UAD  Dispense: 6 each; Refill: 0 - promethazine-dextromethorphan (PROMETHAZINE-DM) 6.25-15 MG/5ML syrup; Take 5 mLs by mouth 4 (four) times daily as needed for cough.  Dispense: 118 mL; Refill: 0 - albuterol (VENTOLIN HFA) 108 (90 Base) MCG/ACT inhaler; Inhale 2 puffs into the lungs every 6 (six) hours as needed for wheezing or  shortness of breath.  Dispense: 1 each; Refill: 0 - POC COVID-19 BinaxNow - negative   Partially dictated using Editor, commissioning. Any errors are unintentional.  Halina Maidens, MD [facility]  04/11/2021

## 2021-04-19 ENCOUNTER — Encounter: Payer: Self-pay | Admitting: Internal Medicine

## 2021-04-19 ENCOUNTER — Ambulatory Visit (INDEPENDENT_AMBULATORY_CARE_PROVIDER_SITE_OTHER): Payer: Medicare Other | Admitting: Internal Medicine

## 2021-04-19 ENCOUNTER — Other Ambulatory Visit: Payer: Self-pay

## 2021-04-19 VITALS — BP 126/84 | HR 103 | Ht 60.0 in | Wt 134.0 lb

## 2021-04-19 DIAGNOSIS — B370 Candidal stomatitis: Secondary | ICD-10-CM

## 2021-04-19 DIAGNOSIS — M5136 Other intervertebral disc degeneration, lumbar region: Secondary | ICD-10-CM | POA: Diagnosis not present

## 2021-04-19 MED ORDER — NYSTATIN 100000 UNIT/ML MT SUSP: OROMUCOSAL | 0 refills | Status: DC

## 2021-04-19 NOTE — Progress Notes (Signed)
Date:  04/19/2021   Name:  Elizabeth Klein   DOB:  [birth year]   MRN:  [id number]   Chief Complaint: Thrush (White stuff on tongue, had this problem 3 times now, not painful )  Mouth Lesions  The current episode started 5 to 7 days ago. The onset was gradual. The problem has been unchanged. The problem is mild. Nothing relieves the symptoms. Exacerbated by: recent antibiotic treatment. Associated symptoms include mouth sores. Pertinent negatives include no fever, no congestion and no cough.  Back Pain This is a chronic problem. The pain is present in the lumbar spine. The quality of the pain is described as aching. The pain radiates to the right knee and left knee. The pain is mild. The symptoms are aggravated by standing. Associated symptoms include leg pain (aching). Pertinent negatives include no bladder incontinence, bowel incontinence, chest pain, fever or weakness.   Lab Results  Component Value Date   NA 140 08/23/2020   K 6.2 (H) 08/23/2020   CO2 17 (L) 08/23/2020   GLUCOSE 103 (H) 08/23/2020   BUN 25 08/23/2020   CREATININE 1.14 (H) 08/23/2020   CALCIUM 9.6 08/23/2020   EGFR 52 (L) 08/23/2020   GFRNONAA 53 (L) 08/23/2019   Lab Results  Component Value Date   CHOL 110 08/23/2020   HDL 40 08/23/2020   LDLCALC 47 08/23/2020   TRIG 128 08/23/2020   CHOLHDL 2.8 08/23/2020   Lab Results  Component Value Date   TSH 1.690 08/23/2020   Lab Results  Component Value Date   HGBA1C 7.2 01/26/2021   Lab Results  Component Value Date   WBC 9.7 08/23/2020   HGB 11.7 08/23/2020   HCT 37.0 08/23/2020   MCV 91 08/23/2020   PLT 322 08/23/2020   Lab Results  Component Value Date   ALT 22 08/23/2020   AST 21 08/23/2020   ALKPHOS 87 08/23/2020   BILITOT 0.3 08/23/2020   Lab Results  Component Value Date   VD25OH 32.6 06/02/2017     Review of Systems  Constitutional:  Negative for chills, fatigue and fever.  HENT:  Positive for mouth sores. Negative for congestion.    Respiratory:  Negative for cough, chest tightness and shortness of breath.   Cardiovascular:  Negative for chest pain.  Gastrointestinal:  Negative for bowel incontinence.  Genitourinary:  Negative for bladder incontinence.  Musculoskeletal:  Positive for back pain.  Neurological:  Negative for tremors and weakness.   Patient Active Problem List   Diagnosis Date Noted   Oropharyngeal dysphagia 02/23/2019   Neuropathy due to type 2 diabetes mellitus ([place]) 08/20/2018   Microalbuminuria 04/30/2017   CKD (chronic kidney disease) stage 3, GFR 30-59 ml/min (HCC) 04/30/2017   Type II diabetes mellitus with complication ([place]) 69/67/8938   Essential hypertension 03/19/2017   CAD (coronary artery disease) 03/19/2017   History of kidney stones 03/19/2017   Psoriasis 03/19/2017   Hyperlipidemia associated with type 2 diabetes mellitus ([place]) 03/19/2017   Nocturnal leg cramps 03/19/2017   Gait instability 03/19/2017   Insomnia 03/19/2017   Allergic rhinitis 03/19/2017    Allergies  Allergen Reactions   Prednisone Other (See Comments)    Intolerance  - confusion     Past Surgical History:  Procedure Laterality Date   BREAST BIOPSY Right    neg   CATARACT EXTRACTION W/PHACO Left 09/27/2019   Procedure: CATARACT EXTRACTION PHACO AND INTRAOCULAR LENS PLACEMENT ([place]) LEFT DIABETIC ISTENT INJ INTRAVITREAL KENALOG INJ;  Surgeon: Eulogio Bear,  MD;  Location: [facility];  Service: Ophthalmology;  Laterality: Left;  2.83 0:28.7   CATARACT EXTRACTION W/PHACO Right 12/13/2019   Procedure: CATARACT EXTRACTION PHACO AND INTRAOCULAR LENS PLACEMENT ([place]) RIGHT ISTENT INJ KENALOG INJ DIABETIC;  Surgeon: Eulogio Bear, MD;  Location: [facility];  Service: Ophthalmology;  Laterality: Right;  1.39 0:21.5   CESAREAN SECTION      Social History   Tobacco Use   Smoking status: Never   Smokeless tobacco: Never  Vaping Use   Vaping Use: Never used  Substance Use Topics    Alcohol use: No   Drug use: No     Medication list has been reviewed and updated.  Current Meds  Medication Sig   acetaminophen (TYLENOL) 500 MG tablet Take 1,000 mg by mouth every 8 (eight) hours as needed.   albuterol (VENTOLIN HFA) 108 (90 Base) MCG/ACT inhaler Inhale 2 puffs into the lungs every 6 (six) hours as needed for wheezing or shortness of breath.   Ascorbic Acid (VITAMIN C PO) Take by mouth.   aspirin EC 81 MG tablet Take 1 tablet (81 mg total) by mouth daily.   atorvastatin (LIPITOR) 80 MG tablet TAKE 1 TABLET BY MOUTH  DAILY   BD INSULIN SYRINGE U/F 30G X 1/2" 0.5 ML MISC USE WITH LANTUS INJECTIONS  FOR TYPE 2 DIABETES  MELLITUS   clobetasol (TEMOVATE) 0.05 % external solution Apply topically 2 (two) times daily. To scalp lesions   clobetasol ointment (TEMOVATE) 9.38 % Apply 1 application topically 2 (two) times daily.   dextromethorphan-guaiFENesin (MUCINEX DM) 30-600 MG 12hr tablet Take 1 tablet by mouth 2 (two) times daily.   glimepiride (AMARYL) 2 MG tablet TAKE 1 TABLET BY MOUTH  DAILY WITH BREAKFAST   glucose blood (ACCU-CHEK SMARTVIEW) test strip USE 3 TIMES DAILY   hydrocortisone 2.5 % ointment Apply topically 2 (two) times daily. To lesion on arm and buttocks   insulin glargine (LANTUS) 100 UNIT/ML injection Inject 0.4 mLs (40 Units total) into the skin every morning. Pt taking QAM (Patient taking differently: Inject 27 Units into the skin every morning. Pt taking QAM)   lidocaine (XYLOCAINE) 5 % ointment Apply topically.   loratadine (CLARITIN) 10 MG tablet Take 10 mg by mouth daily.   losartan (COZAAR) 25 MG tablet Take 1 tablet (25 mg total) by mouth daily.   metFORMIN (GLUCOPHAGE) 1000 MG tablet Take 1 tablet (1,000 mg total) by mouth 2 (two) times daily with a meal.   metoprolol tartrate (LOPRESSOR) 50 MG tablet Take 1 tablet (50 mg total) by mouth 2 (two) times daily.   montelukast (SINGULAIR) 10 MG tablet TAKE 1 TABLET BY MOUTH  DAILY   nystatin (MYCOSTATIN)  100000 UNIT/ML suspension 1 teaspoon  swish, gargle and spit four times per day.   Omega-3 Fatty Acids (FISH OIL) 1000 MG CAPS Take by mouth.   omeprazole (PRILOSEC) 20 MG capsule TAKE 1 CAPSULE BY MOUTH  DAILY   ONETOUCH DELICA PLUS LANCETS MISC 1 each by Does not apply route 2 (two) times daily.   promethazine-dextromethorphan (PROMETHAZINE-DM) 6.25-15 MG/5ML syrup Take 5 mLs by mouth 4 (four) times daily as needed for cough.   Semaglutide, 1 MG/DOSE, (OZEMPIC, 1 MG/DOSE,) 4 MG/3ML SOPN Inject into the skin. Inject 1 mg into the skin every 7 days   traZODone (DESYREL) 50 MG tablet TAKE 1 TABLET BY MOUTH AT  BEDTIME   vitamin B-12 (CYANOCOBALAMIN) 1000 MCG tablet Take 1,000 mcg by mouth daily.  PHQ 2/9 Scores 04/19/2021 04/11/2021 02/23/2021 11/03/2020  PHQ - 2 Score _0 PHQ- 9 Score _1 GAD 7 : Generalized Anxiety Score 04/19/2021 04/11/2021 02/23/2021 11/03/2020  Nervous, Anxious, on Edge 0 _2 Control/stop worrying _3 Worry too much - different things _4 Trouble relaxing 1 1 0 1  Restless 0 1 0 0  Easily annoyed or irritable _5 Afraid - awful might happen 1 1 0 1  Total GAD 7 Score _6 Anxiety Difficulty - Not difficult at all Not difficult at all Somewhat difficult    BP Readings from Last 3 Encounters:  04/19/21 126/84  04/11/21 112/64  02/23/21 112/78    Physical Exam Vitals and nursing note reviewed.  Constitutional:      General: She is not in acute distress.    Appearance: Normal appearance. She is well-developed.  HENT:     Head: Normocephalic and atraumatic.     Mouth/Throat:     Mouth: Mucous membranes are moist.     Comments: White patches on tongue; red lesion on left buccal mucosa Cardiovascular:     Rate and Rhythm: Normal rate and regular rhythm.  Pulmonary:     Effort: Pulmonary effort is normal. No respiratory distress.     Breath sounds: No wheezing or rhonchi.  Musculoskeletal:     Cervical back: Normal range of  motion.     Lumbar back: No bony tenderness. Negative right straight leg raise test and negative left straight leg raise test.     Right hip: No bony tenderness. Normal range of motion.     Left hip: No bony tenderness. Normal range of motion.  Lymphadenopathy:     Cervical: No cervical adenopathy.  Skin:    General: Skin is warm and dry.     Findings: No rash.  Neurological:     Mental Status: She is alert and oriented to person, place, and time.  Psychiatric:        Mood and Affect: Mood normal.        Behavior: Behavior normal.    Wt Readings from Last 3 Encounters:  04/19/21 134 lb (60.8 kg)  04/11/21 138 lb (62.6 kg)  02/23/21 137 lb 9.6 oz (62.4 kg)    BP 126/84    Pulse (!) 103    Ht 5' (1.524 m)    Wt 134 lb (60.8 kg)    SpO2 97%    BMI 26.17 kg/m   Assessment and Plan: 1. Thrush Mild symptoms due to recent antibiotic use Recommend nystatin qid for 5-7 days - nystatin (MYCOSTATIN) 100000 UNIT/ML suspension; 1 teaspoon  swish, gargle and spit four times per day.  Dispense: 120 mL; Refill: 0  2. Degeneration of lumbar intervertebral disc With bilateral LE discomfort and intermittent weakness No red flag s/s noted. Continue tylenol tid; activity as tolerated If worsening, consider repeat imaging and Ortho referral.   Partially dictated using Editor, commissioning. Any errors are unintentional.  Halina Maidens, MD [facility]  04/19/2021

## 2021-04-20 ENCOUNTER — Encounter: Payer: Self-pay | Admitting: Emergency Medicine

## 2021-04-20 ENCOUNTER — Ambulatory Visit
Admission: EM | Admit: 2021-04-20 | Discharge: 2021-04-20 | Disposition: A | Discharge status: Home / Self Care | Payer: Medicare Other

## 2021-04-20 ENCOUNTER — Ambulatory Visit: Payer: Self-pay | Admitting: *Deleted

## 2021-04-20 ENCOUNTER — Encounter: Payer: Self-pay | Admitting: Intensive Care

## 2021-04-20 ENCOUNTER — Other Ambulatory Visit: Payer: Self-pay

## 2021-04-20 ENCOUNTER — Emergency Department: Payer: Medicare Other

## 2021-04-20 ENCOUNTER — Emergency Department
Admission: EM | Admit: 2021-04-20 | Discharge: 2021-04-20 | Disposition: A | Discharge status: Home / Self Care | Payer: Medicare Other | Attending: Emergency Medicine | Admitting: Emergency Medicine

## 2021-04-20 DIAGNOSIS — D72829 Elevated white blood cell count, unspecified: Secondary | ICD-10-CM | POA: Diagnosis not present

## 2021-04-20 DIAGNOSIS — E119 Type 2 diabetes mellitus without complications: Secondary | ICD-10-CM | POA: Diagnosis not present

## 2021-04-20 DIAGNOSIS — I10 Essential (primary) hypertension: Secondary | ICD-10-CM | POA: Insufficient documentation

## 2021-04-20 DIAGNOSIS — J069 Acute upper respiratory infection, unspecified: Secondary | ICD-10-CM | POA: Insufficient documentation

## 2021-04-20 DIAGNOSIS — R519 Headache, unspecified: Secondary | ICD-10-CM

## 2021-04-20 DIAGNOSIS — R059 Cough, unspecified: Secondary | ICD-10-CM | POA: Diagnosis not present

## 2021-04-20 DIAGNOSIS — Z20822 Contact with and (suspected) exposure to covid-19: Secondary | ICD-10-CM | POA: Insufficient documentation

## 2021-04-20 DIAGNOSIS — B9789 Other viral agents as the cause of diseases classified elsewhere: Secondary | ICD-10-CM | POA: Diagnosis not present

## 2021-04-20 DIAGNOSIS — R0602 Shortness of breath: Secondary | ICD-10-CM | POA: Diagnosis not present

## 2021-04-20 HISTORY — DX: Type 2 diabetes mellitus without complications: E11.9

## 2021-04-20 HISTORY — DX: Essential (primary) hypertension: I10

## 2021-04-20 LAB — CBC WITH DIFFERENTIAL/PLATELET
Abs Immature Granulocytes: 0.05 10*3/uL (ref 0.00–0.07)
Basophils Absolute: 0.1 10*3/uL (ref 0.0–0.1)
Basophils Relative: 0 %
Eosinophils Absolute: 0.2 10*3/uL (ref 0.0–0.5)
Eosinophils Relative: 2 %
HCT: 38.1 % (ref 36.0–46.0)
Hemoglobin: 11.9 g/dL — ABNORMAL LOW (ref 12.0–15.0)
Immature Granulocytes: 0 %
Lymphocytes Relative: 17 %
Lymphs Abs: 2.4 10*3/uL (ref 0.7–4.0)
MCH: 28.2 pg (ref 26.0–34.0)
MCHC: 31.2 g/dL (ref 30.0–36.0)
MCV: 90.3 fL (ref 80.0–100.0)
Monocytes Absolute: 0.6 10*3/uL (ref 0.1–1.0)
Monocytes Relative: 4 %
Neutro Abs: 10.8 10*3/uL — ABNORMAL HIGH (ref 1.7–7.7)
Neutrophils Relative %: 77 %
Platelets: 442 10*3/uL — ABNORMAL HIGH (ref 150–400)
RBC: 4.22 MIL/uL (ref 3.87–5.11)
RDW: 13.2 % (ref 11.5–15.5)
WBC: 14.1 10*3/uL — ABNORMAL HIGH (ref 4.0–10.5)
nRBC: 0 % (ref 0.0–0.2)

## 2021-04-20 LAB — COMPREHENSIVE METABOLIC PANEL
ALT: 18 U/L (ref 0–44)
AST: 21 U/L (ref 15–41)
Albumin: 3.8 g/dL (ref 3.5–5.0)
Alkaline Phosphatase: 69 U/L (ref 38–126)
Anion gap: 9 (ref 5–15)
BUN: 28 mg/dL — ABNORMAL HIGH (ref 8–23)
CO2: 22 mmol/L (ref 22–32)
Calcium: 8.9 mg/dL (ref 8.9–10.3)
Chloride: 106 mmol/L (ref 98–111)
Creatinine, Ser: 0.94 mg/dL (ref 0.44–1.00)
GFR, Estimated: >60 mL/min (ref >60)
Glucose, Bld: 141 mg/dL — ABNORMAL HIGH (ref 70–99)
Potassium: 4.7 mmol/L (ref 3.5–5.1)
Sodium: 137 mmol/L (ref 135–145)
Total Bilirubin: 0.5 mg/dL (ref 0.3–1.2)
Total Protein: 8 g/dL (ref 6.5–8.1)

## 2021-04-20 LAB — RESP PANEL BY RT-PCR (FLU A&B, COVID) ARPGX2
Influenza A by PCR: NEGATIVE
Influenza B by PCR: NEGATIVE
SARS Coronavirus 2 by RT PCR: NEGATIVE

## 2021-04-20 LAB — TROPONIN I (HIGH SENSITIVITY): Troponin I (High Sensitivity): 5 ng/L (ref <18)

## 2021-04-20 NOTE — ED Provider Notes (Signed)
[facility]    CSN: [id number] Arrival date & time: 04/20/21  1522      History   Chief Complaint Chief Complaint  Patient presents with   Headache    HPI Elizabeth Klein is a 71 y.o. female.   HPI  71 year old female here for evaluation of headache.  Patient is here with her husband for evaluation of a headache.  She is reporting that she had a headache starting this morning but the husband ports that she developed a severe headache 3 hours ago in which she was screaming constantly.  He called her primary care doctor who advised advised him to take her to the emergency department so he brought her here to the urgent care.  Patient reports that this is the worst headache she is ever had and she describes as being global.  She does have a history of migraines but states that she has not had a migraine in 30 or 40 years.  She denies any changes in vision but she did have nausea and vomiting.  Patient and her husband are conflicting on the timeline and she cannot answer all questions completely.  She is unable to tell me if she has any numbness, tingling, or weakness in any of her extremities.  Husband indicates that she walk from the house to the car and the car into the urgent care.  Taken ibuprofen for her headache which she reports has not helped and she is rating her headache as an 8/10.  Past Medical History:  Diagnosis Date   Acid reflux    Chest pain    Diabetes mellitus without complication ([place])    Hepatitis    age 89   High cholesterol    Hypertension    Kidney stones    Migraines    Psoriasis    Rapid heart rate    Shingles    spring 2021   Vertigo    1x - approx 12 yrs ago   Weight loss     Patient Active Problem List   Diagnosis Date Noted   Degeneration of lumbar intervertebral disc 04/19/2021   Oropharyngeal dysphagia 02/23/2019   Neuropathy due to type 2 diabetes mellitus ([place]) 08/20/2018   Microalbuminuria 04/30/2017   CKD (chronic  kidney disease) stage 3, GFR 30-59 ml/min (HCC) 04/30/2017   Type II diabetes mellitus with complication ([place]) 78/29/5621   Essential hypertension 03/19/2017   CAD (coronary artery disease) 03/19/2017   History of kidney stones 03/19/2017   Psoriasis 03/19/2017   Hyperlipidemia associated with type 2 diabetes mellitus ([place]) 03/19/2017   Nocturnal leg cramps 03/19/2017   Gait instability 03/19/2017   Insomnia 03/19/2017   Allergic rhinitis 03/19/2017    Past Surgical History:  Procedure Laterality Date   BREAST BIOPSY Right    neg   CATARACT EXTRACTION W/PHACO Left 09/27/2019   Procedure: CATARACT EXTRACTION PHACO AND INTRAOCULAR LENS PLACEMENT ([place]) LEFT DIABETIC ISTENT INJ INTRAVITREAL KENALOG INJ;  Surgeon: Eulogio Bear, MD;  Location: [facility];  Service: Ophthalmology;  Laterality: Left;  2.83 0:28.7   CATARACT EXTRACTION W/PHACO Right 12/13/2019   Procedure: CATARACT EXTRACTION PHACO AND INTRAOCULAR LENS PLACEMENT ([place]) RIGHT ISTENT INJ KENALOG INJ DIABETIC;  Surgeon: Eulogio Bear, MD;  Location: [facility];  Service: Ophthalmology;  Laterality: Right;  1.39 0:21.5   CESAREAN SECTION      OB History   No obstetric history on file.      Home Medications    Prior to  Admission medications   Medication Sig Start Date End Date Taking? Authorizing Provider  acetaminophen (TYLENOL) 500 MG tablet Take 1,000 mg by mouth every 8 (eight) hours as needed.    [provider]  albuterol (VENTOLIN HFA) 108 (90 Base) MCG/ACT inhaler Inhale 2 puffs into the lungs every 6 (six) hours as needed for wheezing or shortness of breath. 04/11/21   Glean Hess, MD  Ascorbic Acid (VITAMIN C PO) Take by mouth.    [provider]  aspirin EC 81 MG tablet Take 1 tablet (81 mg total) by mouth daily. 07/01/17   Juline Patch, MD  atorvastatin (LIPITOR) 80 MG tablet TAKE 1 TABLET BY MOUTH  DAILY 02/01/21   Glean Hess, MD  BD INSULIN SYRINGE U/F  30G X 1/2" 0.5 ML MISC USE WITH LANTUS INJECTIONS  FOR TYPE 2 DIABETES  MELLITUS 09/13/20   Glean Hess, MD  clobetasol (TEMOVATE) 0.05 % external solution Apply topically 2 (two) times daily. To scalp lesions 04/11/21   Glean Hess, MD  clobetasol ointment (TEMOVATE) 6.27 % Apply 1 application topically 2 (two) times daily. 11/07/20   [provider]  dextromethorphan-guaiFENesin (MUCINEX DM) 30-600 MG 12hr tablet Take 1 tablet by mouth 2 (two) times daily.    [provider]  glimepiride (AMARYL) 2 MG tablet TAKE 1 TABLET BY MOUTH  DAILY WITH BREAKFAST 01/23/21   Glean Hess, MD  glucose blood (ACCU-CHEK SMARTVIEW) test strip USE 3 TIMES DAILY 11/06/18   [provider]  hydrocortisone 2.5 % ointment Apply topically 2 (two) times daily. To lesion on arm and buttocks 04/11/21   Glean Hess, MD  insulin glargine (LANTUS) 100 UNIT/ML injection Inject 0.4 mLs (40 Units total) into the skin every morning. Pt taking QAM Patient taking differently: Inject 27 Units into the skin every morning. Pt taking QAM 01/25/19   Glean Hess, MD  lidocaine (XYLOCAINE) 5 % ointment Apply topically. 10/04/20 10/04/21  [provider]  loratadine (CLARITIN) 10 MG tablet Take 10 mg by mouth daily.    [provider]  losartan (COZAAR) 25 MG tablet Take 1 tablet (25 mg total) by mouth daily. 02/23/21   Glean Hess, MD  metFORMIN (GLUCOPHAGE) 1000 MG tablet Take 1 tablet (1,000 mg total) by mouth 2 (two) times daily with a meal. 02/23/21   Glean Hess, MD  metoprolol tartrate (LOPRESSOR) 50 MG tablet Take 1 tablet (50 mg total) by mouth 2 (two) times daily. 02/23/21   Glean Hess, MD  montelukast (SINGULAIR) 10 MG tablet TAKE 1 TABLET BY MOUTH  DAILY 02/14/21   Glean Hess, MD  nystatin (MYCOSTATIN) 100000 UNIT/ML suspension 1 teaspoon  swish, gargle and spit four times per day. 04/19/21   Glean Hess, MD  Omega-3 Fatty Acids  (FISH OIL) 1000 MG CAPS Take by mouth.    [provider]  omeprazole (PRILOSEC) 20 MG capsule TAKE 1 CAPSULE BY MOUTH  DAILY 03/01/21   Glean Hess, MD  [facility] DELICA PLUS LANCETS MISC 1 each by Does not apply route 2 (two) times daily. 10/03/17   Glean Hess, MD  promethazine-dextromethorphan (PROMETHAZINE-DM) 6.25-15 MG/5ML syrup Take 5 mLs by mouth 4 (four) times daily as needed for cough. 04/11/21   Glean Hess, MD  Semaglutide, 1 MG/DOSE, (OZEMPIC, 1 MG/DOSE,) 4 MG/3ML SOPN Inject into the skin. Inject 1 mg into the skin every 7 days 09/06/19   [provider]  traZODone (  DESYREL) 50 MG tablet TAKE 1 TABLET BY MOUTH AT  BEDTIME 03/06/21   Glean Hess, MD  vitamin B-12 (CYANOCOBALAMIN) 1000 MCG tablet Take 1,000 mcg by mouth daily.    [provider]    Family History Family History  Problem Relation Age of Onset   Breast cancer Neg Hx    Diabetes Brother    Heart attack Brother    Diabetes Brother    Heart attack Brother    Diabetes Brother     Social History Social History   Tobacco Use   Smoking status: Never   Smokeless tobacco: Never  Vaping Use   Vaping Use: Never used  Substance Use Topics   Alcohol use: No   Drug use: No     Allergies   Prednisone   Review of Systems Review of Systems  Gastrointestinal:  Positive for nausea and vomiting.  Skin:  Negative for rash.  Neurological:  Positive for headaches. Negative for facial asymmetry.  Hematological: Negative.   Psychiatric/Behavioral: Negative.      Physical Exam Triage Vital Signs ED Triage Vitals  Enc Vitals Group     BP 04/20/21 1537 124/77     Pulse Rate 04/20/21 1537 90     Resp 04/20/21 1537 14     Temp 04/20/21 1537 98.5 F (36.9 C)     Temp Source 04/20/21 1537 Oral     SpO2 04/20/21 1537 100 %     Weight 04/20/21 1533 134 lb 0.6 oz (60.8 kg)     Height 04/20/21 1533 5' (1.524 m)     Head Circumference --      Peak Flow --      Pain Score  04/20/21 1533 9     Pain Loc --      Pain Edu? --      Excl. in [place]? --    No data found.  Updated Vital Signs BP 124/77 (BP Location: Left Arm)    Pulse 90    Temp 98.5 F (36.9 C) (Oral)    Resp 14    Ht 5' (1.524 m)    Wt 134 lb 0.6 oz (60.8 kg)    SpO2 100%    BMI 26.18 kg/m   Visual Acuity Right Eye Distance:   Left Eye Distance:   Bilateral Distance:    Right Eye Near:   Left Eye Near:    Bilateral Near:     Physical Exam Vitals and nursing note reviewed.  Constitutional:      General: She is in acute distress.     Appearance: She is not toxic-appearing.  HENT:     Head: Normocephalic and atraumatic.  Eyes:     Extraocular Movements: Extraocular movements intact.     Pupils: Pupils are equal, round, and reactive to light.  Cardiovascular:     Rate and Rhythm: Normal rate and regular rhythm.     Pulses: Normal pulses.     Heart sounds: Normal heart sounds. No murmur heard.   No friction rub. No gallop.  Pulmonary:     Effort: Pulmonary effort is normal.     Breath sounds: Normal breath sounds. No wheezing, rhonchi or rales.  Skin:    General: Skin is warm and dry.     Capillary Refill: Capillary refill takes less than 2 seconds.     Findings: No erythema or rash.  Neurological:     Mental Status: She is alert.     Cranial Nerves: Cranial nerve deficit  present.     Sensory: No sensory deficit.     Motor: Weakness present.  Psychiatric:        Mood and Affect: Mood normal.        Behavior: Behavior normal.        Thought Content: Thought content normal.        Judgment: Judgment normal.     UC Treatments / Results  Labs (all labs ordered are listed, but only abnormal results are displayed) Labs Reviewed - No data to display  EKG   Radiology No results found.  Procedures Procedures (including critical care time)  Medications Ordered in UC Medications - No data to display  Initial Impression / Assessment and Plan / UC Course  I have reviewed  the triage vital signs and the nursing notes.  Pertinent labs & imaging results that were available during my care of the patient were reviewed by me and considered in my medical decision making (see chart for details).  Patient is a pleasant 71 year old female here for evaluation of headache that she reports started this morning has reports started approximately 3 hours ago.  He states that she was screaming and complaining of being the worst headache of her life.  He contacted her PCP and was advised to take her to the emergency department so he brought her to the urgent care.  I explained to him that we are not the same thing and that she needs imaging of her head that I cannot provide here.  Her physical exam reveals a patient who can speak in full sentences and can tell me most of what is going on but she can flex and the timeline with her husband.  She states that she having a global headache.  When asked if she has change in vision she cannot give me a clear answer despite being asked multiple times.  Her pupils are equal and round and her EOM is largely intact but she does have difficulty following my penlight and reports that this causes an increase in her pain.  The remainder of her cranial nerves are unremarkable with the exception of cranial nerve V as she is reporting changes in sensation that alternate between sides of her face and region.  Patient does have slightly decreased grip in the right hand at 4/5 and decreased right lower extremity strength at 4/5.  DTRs are 1+ globally.  She will pronator drift opposite shin she is unable to understand what is being asked of her.  Even with demonstration she is having trouble giving return demonstration.  Patient's vital signs are stable.  I am concerned that patient is experiencing an acute neurovascular event.  I advised the patient and her husband that she needs to be evaluated in the emergency department and offered to call EMS.  Patient's husband  indicates that he can take her in his vehicle.  Patient left ambulatory in stable condition.   Final Clinical Impressions(s) / UC Diagnoses   Final diagnoses:  Acute nonintractable headache, unspecified headache type     Discharge Instructions      Please go to [facility] ER for evaluation of your headache.  Go now.     ED Prescriptions   None    PDMP not reviewed this encounter.   Margarette Canada, NP 04/20/21 1614

## 2021-04-20 NOTE — Discharge Instructions (Addendum)
Please go to [facility] ER for evaluation of your headache.  Go now.

## 2021-04-20 NOTE — Telephone Encounter (Signed)
°  Chief Complaint: Severe headache.   "I feel like I'm going to die" Symptoms: Severe headache all over head, nausea and vomited Frequency: Since this morning.   Had bronchitis last week but better from that. Pertinent Negatives: Patient denies N/A Disposition: [x] ED /[] Urgent Care (no appt availability in office) / [] Appointment(In office/virtual)/ []  [facility]/ [] Home Care/ [] Refused Recommended Disposition /[] [place] [facility]/ []  Follow-up with PCP Additional Notes: Due to severe pain husband is taking her to the ED.   Per husband he has been trying to get her to go to the ED but she has refused but when I said she needed to go he got back on the line and said he is taking her to the ED now and hung up.

## 2021-04-20 NOTE — ED Provider Notes (Signed)
[facility] Provider Note    Event Date/Time   First MD Initiated Contact with Patient 04/20/21 1742     (approximate)   History   Cough and Shortness of Breath   HPI  Elizabeth Klein is a 71 y.o. female who presents to the ED for evaluation of Cough and Shortness of Breath   History of HTN, HLD and DM. In review outpatient PCP visit from yesterday as well as 2/15.  She was recently seen for an acute productive cough and given azithromycin.  Returned yesterday and diagnosed with oral thrush.  Given nystatin solution for this.  Patient presents to the ED for evaluation of a coughing fit from earlier this afternoon.  She reports that she was just coughing so much this afternoon, her husband urged her to get checked out.  They went to an urgent care who referred her to [place] for evaluation.  She reports feeling better now.  She reports resolution of her oral thrush.  She reports no fevers, chest pain, shortness of breath, productive cough, syncope, falls or injuries.  She is tolerating p.o. intake and toileting at her baseline.  Physical Exam   Triage Vital Signs: ED Triage Vitals  Enc Vitals Group     BP 04/20/21 1648 (!) 143/86     Pulse Rate 04/20/21 1648 96     Resp 04/20/21 1648 20     Temp 04/20/21 1648 98.7 F (37.1 C)     Temp Source 04/20/21 1648 Oral     SpO2 04/20/21 1648 100 %     Weight 04/20/21 1649 137 lb (62.1 kg)     Height 04/20/21 1649 4\' 11"  (1.499 m)     Head Circumference --      Peak Flow --      Pain Score 04/20/21 1649 8     Pain Loc --      Pain Edu? --      Excl. in [place]? --     Most recent vital signs: Vitals:   04/20/21 1648  BP: (!) 143/86  Pulse: 96  Resp: 20  Temp: 98.7 F (37.1 C)  SpO2: 100%    General: Awake, no distress.  Pleasant and conversational in full sentences. CV:  Good peripheral perfusion.  RRR Resp:  Normal effort.  CTA B Abd:  No distention.  Soft and benign throughout MSK:  No deformity  noted.  Neuro:  No focal deficits appreciated. Other:  Clear mouth.  Uvula is midline and oropharynx is nonerythematous.  Tonsils 1+ bilaterally.   ED Results / Procedures / Treatments   Labs (all labs ordered are listed, but only abnormal results are displayed) Labs Reviewed  CBC WITH DIFFERENTIAL/PLATELET - Abnormal; Notable for the following components:      Result Value   WBC 14.1 (*)    Hemoglobin 11.9 (*)    Platelets 442 (*)    Neutro Abs 10.8 (*)    All other components within normal limits  COMPREHENSIVE METABOLIC PANEL - Abnormal; Notable for the following components:   Glucose, Bld 141 (*)    BUN 28 (*)    All other components within normal limits  RESP PANEL BY RT-PCR (FLU A&B, COVID) [id number]  TROPONIN I (HIGH SENSITIVITY)    EKG Sinus rhythm, rate of 95 bpm.  Normal axis and intervals.  No evidence of acute ischemia.  RADIOLOGY CXR reviewed by me without evidence of acute cardiopulmonary pathology.  Official radiology report(s): DG Chest 2 View  Result Date: 04/20/2021 CLINICAL DATA:  Cough EXAM: CHEST - 2 VIEW COMPARISON:  08/06/2019 FINDINGS: The heart size and mediastinal contours are within normal limits. Atherosclerotic calcification of the aortic knob. Persistent elevation of the right hemidiaphragm. Both lungs are clear. The visualized skeletal structures are unremarkable. IMPRESSION: No active cardiopulmonary disease. Electronically Signed   By: Davina Poke D.O.   On: 04/20/2021 17:45    PROCEDURES and INTERVENTIONS:  .1-3 Lead EKG Interpretation Performed by: Vladimir Crofts, MD Authorized by: Vladimir Crofts, MD     Interpretation: normal     ECG rate:  88   ECG rate assessment: normal     Rhythm: sinus rhythm     Ectopy: none     Conduction: normal    Medications - No data to display   IMPRESSION / MDM / [place] / ED COURSE  I reviewed the triage vital signs and the nursing notes.  71 year old woman recently treated for  bronchitis presents to the ED with continued cough, likely viral in etiology and suitable for outpatient management.  She looks clinically well and has reassuring vitals on room air.  Exam is normal without evidence of respiratory distress, neurologic or vascular deficits.  Her chest x-ray is clear making CAP much less likely, and she has no increased fever or productive cough to suggest this.  EKG is nonischemic and troponin is negative.  Testing negative for COVID and flu.  Metabolic panel is essentially normal.  CBC with a mild leukocytosis that is nonspecific.  Doubt sepsis or systemic bacterial illness.  I do not see clear indication for another course of antibiotics.  I briefly considered medical admission, but I do not believe this is warranted.  She is requesting discharge and I think that is reasonable.  We discussed return precautions and management at home.      FINAL CLINICAL IMPRESSION(S) / ED DIAGNOSES   Final diagnoses:  Viral URI with cough     Rx / DC Orders   ED Discharge Orders     None        Note:  This document was prepared using Dragon voice recognition software and may include unintentional dictation errors.   Vladimir Crofts, MD 04/20/21 2008

## 2021-04-20 NOTE — ED Triage Notes (Signed)
Patient c/o headache that started this morning.  Patient states that it is the worse HA she has had.  Patient states that she saw her PCP yesterday for Follow up to bronchitis.

## 2021-04-20 NOTE — ED Notes (Signed)
Patient is being discharged from the Urgent Care and sent to the [facility] Emergency Department via private vehicle with husband . Per Margarette Canada, NP, patient is in need of higher level of care due to severe HA and abnormal neurological exam. Patient is aware and verbalizes understanding of plan of care.  Vitals:   04/20/21 1537  BP: 124/77  Pulse: 90  Resp: 14  Temp: 98.5 F (36.9 C)  SpO2: 100%

## 2021-04-20 NOTE — Discharge Instructions (Signed)
Use Tylenol for pain and fevers.  Up to 1000 mg per dose, up to 4 times per day.  Do not take more than 4000 mg of Tylenol/acetaminophen within 24 hours..  

## 2021-04-20 NOTE — ED Triage Notes (Signed)
Patient has c/o cough and sob X2 days. Diagnosed with bronchitis last week and took course of antibiotics. C/O headache

## 2021-04-20 NOTE — Telephone Encounter (Signed)
Husband called in. Reason for Disposition  [1] SEVERE headache (e.g., excruciating) AND [2] "worst headache" of life  Answer Assessment - Initial Assessment Questions 1. LOCATION: "Where does it hurt?"      Pt c/o severe headache.  I have a terrible headache since this morning.   Tylenol is not helping.    Pt is crying.   "I don't know what to do". 2. ONSET: "When did the headache start?" (Minutes, hours or days)      This morning     I just vomited.   I've had a cough for a week.   I had bronchitis.   I was given medication and I took all of it.     This is the first time I've had a bad headache like this.   I'm having nausea and I vomited in the bathroom earlier today.      I think I will die so soon.   I feel like I could I die. I instructed her to go to the ED.   Husband got on phone and let me know "I've told her several times she needs to go to the emergency room".   I'm going to take her now and hung up.  3. PATTERN: "Does the pain come and go, or has it been constant since it started?"     *No Answer* 4. SEVERITY: "How bad is the pain?" and "What does it keep you from doing?"  (e.g., Scale 1-10; mild, moderate, or severe)   - MILD (1-3): doesn't interfere with normal activities    - MODERATE (4-7): interferes with normal activities or awakens from sleep    - SEVERE (8-10): excruciating pain, unable to do any normal activities        Severe 5. RECURRENT SYMPTOM: "Have you ever had headaches before?" If Yes, ask: "When was the last time?" and "What happened that time?"      No 6. CAUSE: "What do you think is causing the headache?"     I don't know 7. MIGRAINE: "Have you been diagnosed with migraine headaches?" If Yes, ask: "Is this headache similar?"      *No Answer* 8. HEAD INJURY: "Has there been any recent injury to the head?"      *No Answer* 9. OTHER SYMPTOMS: "Do you have any other symptoms?" (fever, stiff neck, eye pain, sore throat, cold symptoms)     *No Answer* 10.  PREGNANCY: "Is there any chance you are pregnant?" "When was your last menstrual period?"       *No Answer*  Protocols used: Headache-A-AH

## 2021-04-23 ENCOUNTER — Encounter: Payer: Self-pay | Admitting: Emergency Medicine

## 2021-04-23 ENCOUNTER — Ambulatory Visit: Payer: Self-pay | Admitting: *Deleted

## 2021-04-23 NOTE — Telephone Encounter (Signed)
Summary: body aches   Pt called in stating she has been having body aches, pt states she has been taking tylenol. Please advise.       Called Pt  - LMOM to return call.

## 2021-04-23 NOTE — Telephone Encounter (Signed)
Pt has an appointment scheduled for tomorrow.  KP

## 2021-04-23 NOTE — Telephone Encounter (Signed)
Summary: body aches   Pt called in stating she has been having body aches, pt states she has been taking tylenol. Please advise.        Called pt  - LMOM to return our call.

## 2021-04-23 NOTE — Telephone Encounter (Signed)
I returned pt's call.   She is c/o body aches.  Taking Tylenol.  Left a voicemail to call back.

## 2021-04-23 NOTE — Telephone Encounter (Signed)
3 attempts had been made to return this pt's call.   Forwarded to [facility].  She had called in c/o having body aches.   Was taking Tylenol.

## 2021-04-24 ENCOUNTER — Encounter: Payer: Self-pay | Admitting: Internal Medicine

## 2021-04-24 ENCOUNTER — Other Ambulatory Visit: Payer: Self-pay

## 2021-04-24 ENCOUNTER — Ambulatory Visit (INDEPENDENT_AMBULATORY_CARE_PROVIDER_SITE_OTHER): Payer: Medicare Other | Admitting: Internal Medicine

## 2021-04-24 ENCOUNTER — Ambulatory Visit: Payer: Self-pay | Admitting: *Deleted

## 2021-04-24 VITALS — BP 118/70 | HR 96 | Ht 59.0 in | Wt 137.0 lb

## 2021-04-24 DIAGNOSIS — R051 Acute cough: Secondary | ICD-10-CM | POA: Diagnosis not present

## 2021-04-24 DIAGNOSIS — J0100 Acute maxillary sinusitis, unspecified: Secondary | ICD-10-CM | POA: Diagnosis not present

## 2021-04-24 MED ORDER — PROMETHAZINE-DM 6.25-15 MG/5ML PO SYRP: 5.0000 mL | ORAL_SOLUTION | Freq: Four times a day (QID) | ORAL | 0 refills | Status: DC | PRN

## 2021-04-24 MED ORDER — FLUCONAZOLE 100 MG PO TABS: 100.0000 mg | ORAL_TABLET | ORAL | 0 refills | Status: AC

## 2021-04-24 MED ORDER — AMOXICILLIN-POT CLAVULANATE 875-125 MG PO TABS: 1.0000 | ORAL_TABLET | Freq: Two times a day (BID) | ORAL | 0 refills | Status: AC

## 2021-04-24 NOTE — Progress Notes (Signed)
° ° °Date:  04/24/2021  ° °Name:  Elizabeth Klein   DOB:  05/27/1950   MRN:  [id number] ° ° °Chief Complaint: Cough (Follow up.) ° °Cough °This is a recurrent problem. The problem has been waxing and waning. The cough is Productive of sputum. Associated symptoms include chest pain, ear congestion, headaches, nasal congestion and postnasal drip. Pertinent negatives include no chills, fever, sore throat, shortness of breath or wheezing.  °Sinus Problem °This is a recurrent problem. There has been no fever. Associated symptoms include congestion, coughing, headaches and sinus pressure. Pertinent negatives include no chills, shortness of breath or sore throat. Treatments tried: zpak.  ° °Lab Results  °Component Value Date  ° NA 137 04/20/2021  ° K 4.7 04/20/2021  ° CO2 22 04/20/2021  ° GLUCOSE 141 (H) 04/20/2021  ° BUN 28 (H) 04/20/2021  ° CREATININE 0.94 04/20/2021  ° CALCIUM 8.9 04/20/2021  ° EGFR 52 (L) 08/23/2020  ° GFRNONAA >60 04/20/2021  ° °Lab Results  °Component Value Date  ° CHOL 110 08/23/2020  ° HDL 40 08/23/2020  ° LDLCALC 47 08/23/2020  ° TRIG 128 08/23/2020  ° CHOLHDL 2.8 08/23/2020  ° °Lab Results  °Component Value Date  ° TSH 1.690 08/23/2020  ° °Lab Results  °Component Value Date  ° HGBA1C 7.2 01/26/2021  ° °Lab Results  °Component Value Date  ° WBC 14.1 (H) 04/20/2021  ° HGB 11.9 (L) 04/20/2021  ° HCT 38.1 04/20/2021  ° MCV 90.3 04/20/2021  ° PLT 442 (H) 04/20/2021  ° °Lab Results  °Component Value Date  ° ALT 18 04/20/2021  ° AST 21 04/20/2021  ° ALKPHOS 69 04/20/2021  ° BILITOT 0.5 04/20/2021  ° °Lab Results  °Component Value Date  ° VD25OH 32.6 06/02/2017  °  ° °Review of Systems  °Constitutional:  Positive for fatigue. Negative for chills and fever.  °HENT:  Positive for congestion, postnasal drip and sinus pressure. Negative for sore throat and trouble swallowing.   °Eyes:  Negative for visual disturbance.  °Respiratory:  Positive for cough. Negative for chest tightness, shortness of breath and  wheezing.   °Cardiovascular:  Positive for chest pain. Negative for palpitations and leg swelling.  °Neurological:  Positive for headaches.  ° °Patient Active Problem List  ° Diagnosis Date Noted  ° Degeneration of lumbar intervertebral disc 04/19/2021  ° Oropharyngeal dysphagia 02/23/2019  ° 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  ° Microalbuminuria 04/30/2017  ° CKD (chronic kidney disease) stage 3, GFR 30-59 ml/min (HCC) 04/30/2017  ° Type II diabetes mellitus with complication (HCC) 03/19/2017  ° Essential hypertension 03/19/2017  ° CAD (coronary artery disease) 03/19/2017  ° History of kidney stones 03/19/2017  ° Psoriasis 03/19/2017  ° Hyperlipidemia associated with type 2 diabetes mellitus (HCC) 03/19/2017  ° Nocturnal leg cramps 03/19/2017  ° Gait instability 03/19/2017  ° Insomnia 03/19/2017  ° Allergic rhinitis 03/19/2017  ° ° °Allergies  °Allergen Reactions  ° Prednisone Other (See Comments)  °  Intolerance  - confusion °  ° ° °Past Surgical History:  °Procedure Laterality Date  ° BREAST BIOPSY Right   ° neg  ° CATARACT EXTRACTION W/PHACO Left 09/27/2019  ° Procedure: CATARACT EXTRACTION PHACO AND INTRAOCULAR LENS PLACEMENT (IOC) LEFT DIABETIC ISTENT INJ INTRAVITREAL KENALOG INJ;  Surgeon: King, Bradley Mark, MD;  Location: [facility];  Service: Ophthalmology;  Laterality: Left;  2.83 °0:28.7  ° CATARACT EXTRACTION W/PHACO Right 12/13/2019  ° Procedure: CATARACT EXTRACTION PHACO AND INTRAOCULAR LENS PLACEMENT (  IOC) RIGHT ISTENT INJ KENALOG INJ DIABETIC;  Surgeon: King, Bradley Mark, MD;  Location: [facility];  Service: Ophthalmology;  Laterality: Right;  1.39 °0:21.5  ° CESAREAN SECTION    ° ° °Social History  ° °Tobacco Use  ° Smoking status: Never  ° Smokeless tobacco: Never  °Vaping Use  ° Vaping Use: Never used  °Substance Use Topics  ° Alcohol use: Never  ° Drug use: Never  ° ° ° °Medication list has been reviewed and updated. ° °Current Meds  °Medication Sig  °  acetaminophen (TYLENOL) 500 MG tablet Take 1,000 mg by mouth every 8 (eight) hours as needed.  ° albuterol (VENTOLIN HFA) 108 (90 Base) MCG/ACT inhaler Inhale 2 puffs into the lungs every 6 (six) hours as needed for wheezing or shortness of breath.  ° Ascorbic Acid (VITAMIN C PO) Take by mouth.  ° aspirin EC 81 MG tablet Take 1 tablet (81 mg total) by mouth daily.  ° atorvastatin (LIPITOR) 80 MG tablet TAKE 1 TABLET BY MOUTH  DAILY  ° BD INSULIN SYRINGE U/F 30G X 1/2" 0.5 ML MISC USE WITH LANTUS INJECTIONS  FOR TYPE 2 DIABETES  MELLITUS  ° clobetasol (TEMOVATE) 0.05 % external solution Apply topically 2 (two) times daily. To scalp lesions  ° clobetasol ointment (TEMOVATE) 0.05 % Apply 1 application topically 2 (two) times daily.  ° glimepiride (AMARYL) 2 MG tablet TAKE 1 TABLET BY MOUTH  DAILY WITH BREAKFAST  ° glucose blood (ACCU-CHEK SMARTVIEW) test strip USE 3 TIMES DAILY  ° hydrocortisone 2.5 % ointment Apply topically 2 (two) times daily. To lesion on arm and buttocks  ° insulin glargine (LANTUS) 100 UNIT/ML injection Inject 0.4 mLs (40 Units total) into the skin every morning. Pt taking QAM (Patient taking differently: Inject 27 Units into the skin every morning. Pt taking QAM)  ° lidocaine (XYLOCAINE) 5 % ointment Apply topically.  ° loratadine (CLARITIN) 10 MG tablet Take 10 mg by mouth daily.  ° losartan (COZAAR) 25 MG tablet Take 1 tablet (25 mg total) by mouth daily.  ° metFORMIN (GLUCOPHAGE) 1000 MG tablet Take 1 tablet (1,000 mg total) by mouth 2 (two) times daily with a meal.  ° metoprolol tartrate (LOPRESSOR) 50 MG tablet Take 1 tablet (50 mg total) by mouth 2 (two) times daily.  ° montelukast (SINGULAIR) 10 MG tablet TAKE 1 TABLET BY MOUTH  DAILY  ° nystatin (MYCOSTATIN) 100000 UNIT/ML suspension 1 teaspoon  swish, gargle and spit four times per day.  ° Omega-3 Fatty Acids (FISH OIL) 1000 MG CAPS Take by mouth.  ° omeprazole (PRILOSEC) 20 MG capsule TAKE 1 CAPSULE BY MOUTH  DAILY  ° ONETOUCH DELICA  PLUS LANCETS MISC 1 each by Does not apply route 2 (two) times daily.  ° Semaglutide, 1 MG/DOSE, (OZEMPIC, 1 MG/DOSE,) 4 MG/3ML SOPN Inject into the skin. Inject 1 mg into the skin every 7 days  ° traZODone (DESYREL) 50 MG tablet TAKE 1 TABLET BY MOUTH AT  BEDTIME  ° vitamin B-12 (CYANOCOBALAMIN) 1000 MCG tablet Take 1,000 mcg by mouth daily.  ° ° °PHQ 2/9 Scores 04/24/2021 04/19/2021 04/11/2021 02/23/2021  °PHQ - 2 Score 2 2 2 1  °PHQ- 9 Score 8 9 11 4  ° ° °GAD 7 : Generalized Anxiety Score 04/24/2021 04/19/2021 04/11/2021 02/23/2021  °Nervous, Anxious, on Edge 1 0 1 1  °Control/stop worrying 1 1 1 1  °Worry too much - different things 0 1 1 1  °Trouble relaxing 0 1 1   0  °Restless 1 0 1 0  °Easily annoyed or irritable 0 1 1 1  °Afraid - awful might happen 0 1 1 0  °Total GAD 7 Score 3 5 7 4  °Anxiety Difficulty Not difficult at all - Not difficult at all Not difficult at all  ° ° °BP Readings from Last 3 Encounters:  °04/24/21 118/70  °04/20/21 132/78  °04/20/21 124/77  ° ° °Physical Exam °Constitutional:   °   Appearance: She is well-developed.  °HENT:  °   Right Ear: Ear canal and external ear normal. Tympanic membrane is not erythematous or retracted.  °   Left Ear: Ear canal and external ear normal. Tympanic membrane is not erythematous or retracted.  °   Nose:  °   Right Sinus: Maxillary sinus tenderness and frontal sinus tenderness present.  °   Left Sinus: Maxillary sinus tenderness and frontal sinus tenderness present.  °   Mouth/Throat:  °   Lips: Pink.  °   Mouth: Mucous membranes are moist.  °Cardiovascular:  °   Rate and Rhythm: Normal rate and regular rhythm.  °   Pulses: Normal pulses.  °   Heart sounds: Normal heart sounds.  °Pulmonary:  °   Effort: Pulmonary effort is normal.  °   Breath sounds: Normal breath sounds. No wheezing, rhonchi or rales.  °Musculoskeletal:  °   Cervical back: Normal range of motion.  °Lymphadenopathy:  °   Cervical: No cervical adenopathy.  °Neurological:  °   Mental Status:  She is alert and oriented to person, place, and time.  ° ° °Wt Readings from Last 3 Encounters:  °04/24/21 137 lb (62.1 kg)  °04/20/21 137 lb (62.1 kg)  °04/20/21 134 lb 0.6 oz (60.8 kg)  ° ° °BP 118/70    Pulse 96    Ht 4' 11" (1.499 m)    Wt 137 lb (62.1 kg)    SpO2 97%    BMI 27.67 kg/m²  ° °Assessment and Plan: °1. Acute non-recurrent maxillary sinusitis °Begin Flonase NS and Mucinex-DM °Increase fluids. °Augmentin x 10 days. °Diflucan qod x 3 doses to prevent thrush °- amoxicillin-clavulanate (AUGMENTIN) 875-125 MG tablet; Take 1 tablet by mouth 2 (two) times daily for 10 days.  Dispense: 20 tablet; Refill: 0 ° °2. Acute cough °- promethazine-dextromethorphan (PROMETHAZINE-DM) 6.25-15 MG/5ML syrup; Take 5 mLs by mouth 4 (four) times daily as needed for cough.  Dispense: 118 mL; Refill: 0 ° ° °Partially dictated using Dragon software. Any errors are unintentional. ° °Laura Berglund, MD °[facility] °[facility] ° °04/24/2021 ° ° ° ° °

## 2021-04-24 NOTE — Telephone Encounter (Signed)
Pt returned call from yesterday from where the triage nurses attempted to return her call 3 times without success.   She said this morning when she called in that she was in bed all day yesterday is reason she didn't answer the phone.  She called in c/o body aches yesterday.  Staff from the office was able to contact her and made her an appt for today with Dr. Army Melia.  I let her know that's why we called yesterday was to get the appt set up so she was set for today at 2:00.    No triage was necessary.    Reason for Disposition  Health Information question, no triage required and triager able to answer question    Appt made by office staff  Answer Assessment - Initial Assessment Questions 1. REASON FOR CALL or QUESTION: "What is your reason for calling today?" or "How can I best help you?" or "What question do you have that I can help answer?"     She was returning our call from yesterday where the triage nurses attempted to return her call 3 times without success.    Office staff got in contact with her and got her scheduled for today at 2:00 with Dr. Army Melia.  No triage was necessary.   She called in c/o body aches yesterday.  Protocols used: Information Only Call - No Triage-A-AH

## 2021-04-24 NOTE — Patient Instructions (Signed)
Flonase nasal spray - 2 sprays each nostril daily.  Can also try Mucinex-DM.

## 2021-05-01 ENCOUNTER — Other Ambulatory Visit: Payer: Self-pay | Admitting: Internal Medicine

## 2021-05-01 DIAGNOSIS — E118 Type 2 diabetes mellitus with unspecified complications: Secondary | ICD-10-CM

## 2021-05-02 NOTE — Telephone Encounter (Signed)
Requested medication (s) are due for refill today: no ? ?Requested medication (s) are on the active medication list: no ? ?Last refill:  11/27/20 ? ?Future visit scheduled: yes ? ?Notes to clinic:  dose was changed on 02/23/21 to '25mg'$ . Please advise ? ? ?  ?Requested Prescriptions  ?Pending Prescriptions Disp Refills  ? losartan (COZAAR) 50 MG tablet [Pharmacy Med Name: Losartan Potassium 50 MG Oral Tablet] 90 tablet 3  ?  Sig: TAKE 1 TABLET BY MOUTH  DAILY  ?  ? Cardiovascular:  Angiotensin Receptor Blockers Passed - 05/01/2021  3:59 PM  ?  ?  Passed - Cr in normal range and within 180 days  ?  Creatinine, Ser  ?Date Value Ref Range Status  ?04/20/2021 0.94 0.44 - 1.00 mg/dL Final  ?  ?  ?  ?  Passed - K in normal range and within 180 days  ?  Potassium  ?Date Value Ref Range Status  ?04/20/2021 4.7 3.5 - 5.1 mmol/L Final  ?  ?  ?  ?  Passed - Patient is not pregnant  ?  ?  Passed - Last BP in normal range  ?  BP Readings from Last 1 Encounters:  ?04/24/21 118/70  ?  ?  ?  ?  Passed - Valid encounter within last 6 months  ?  Recent Outpatient Visits   ? ?      ? 1 week ago Acute non-recurrent maxillary sinusitis  ? [facility] Glean Hess, MD  ? 1 week ago Ritta Slot  ? [facility] Glean Hess, MD  ? 3 weeks ago Acute cough  ? [facility] Glean Hess, MD  ? 2 months ago Essential hypertension  ? [facility] Glean Hess, MD  ? 6 months ago Rash and nonspecific skin eruption  ? [facility] Glean Hess, MD  ? ?  ?  ?Future Appointments   ? ?        ? In 4 months Army Melia Jesse Sans, MD [facility] [place]  ? ?  ? ?  ?  ?  ? ?

## 2021-05-16 ENCOUNTER — Ambulatory Visit
Admit: 2021-05-16 | Discharge: 2021-05-17 | Payer: MEDICARE | Attending: Student in an Organized Health Care Education/Training Program | Primary: Student in an Organized Health Care Education/Training Program

## 2021-05-16 DIAGNOSIS — L281 Prurigo nodularis: Principal | ICD-10-CM

## 2021-05-16 DIAGNOSIS — L259 Unspecified contact dermatitis, unspecified cause: Secondary | ICD-10-CM | POA: Diagnosis not present

## 2021-05-22 ENCOUNTER — Other Ambulatory Visit: Payer: Self-pay | Admitting: Internal Medicine

## 2021-05-22 DIAGNOSIS — E118 Type 2 diabetes mellitus with unspecified complications: Secondary | ICD-10-CM

## 2021-05-23 DIAGNOSIS — L281 Prurigo nodularis: Principal | ICD-10-CM

## 2021-05-23 MED ORDER — DUPILUMAB 300 MG/2 ML SUBCUTANEOUS PEN INJECTOR
Freq: Once | SUBCUTANEOUS | 0 refills | 0 days | Status: CP
Start: 2021-05-23 — End: 2021-05-23
  Filled 2021-06-01: qty 4, 14d supply, fill #0

## 2021-05-24 NOTE — Telephone Encounter (Signed)
Requested Prescriptions  ?Pending Prescriptions Disp Refills  ?? losartan (COZAAR) 50 MG tablet [Pharmacy Med Name: Losartan Potassium 50 MG Oral Tablet] 90 tablet 1  ?  Sig: TAKE 1 TABLET BY MOUTH  DAILY  ?  ? Cardiovascular:  Angiotensin Receptor Blockers Passed - 05/22/2021  3:55 PM  ?  ?  Passed - Cr in normal range and within 180 days  ?  Creatinine, Ser  ?Date Value Ref Range Status  ?04/20/2021 0.94 0.44 - 1.00 mg/dL Final  ?   ?  ?  Passed - K in normal range and within 180 days  ?  Potassium  ?Date Value Ref Range Status  ?04/20/2021 4.7 3.5 - 5.1 mmol/L Final  ?   ?  ?  Passed - Patient is not pregnant  ?  ?  Passed - Last BP in normal range  ?  BP Readings from Last 1 Encounters:  ?04/24/21 118/70  ?   ?  ?  Passed - Valid encounter within last 6 months  ?  Recent Outpatient Visits   ?      ? 1 month ago Acute non-recurrent maxillary sinusitis  ? [facility] Glean Hess, MD  ? 1 month ago Ritta Slot  ? [facility] Glean Hess, MD  ? 1 month ago Acute cough  ? [facility] Glean Hess, MD  ? 3 months ago Essential hypertension  ? [facility] Glean Hess, MD  ? 6 months ago Rash and nonspecific skin eruption  ? [facility] Glean Hess, MD  ?  ?  ?Future Appointments   ?        ? In 3 months Army Melia Jesse Sans, MD [facility] [place]  ?  ? ?  ?  ?  ? ?

## 2021-05-28 NOTE — Unmapped (Signed)
[facility] Specialty Medication Onboarding    Specialty Medication: Dupixent pen 300mg /57ml  Prior Authorization: Approved   Financial Assistance: No - copay  <$25  Final Copay/Day Supply: $10.35 / 14 (LD)                                           $10.35/ 28 (MD)    Insurance Restrictions: None     Notes to Pharmacist:     The triage team has completed the benefits investigation and has determined that the patient is able to fill this medication at [facility]. Please contact the patient to complete the onboarding or follow up with the prescribing physician as needed.

## 2021-05-31 MED ORDER — EMPTY CONTAINER
2 refills | 0 days
Start: 2021-05-31 — End: ?

## 2021-05-31 NOTE — Unmapped (Signed)
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  I am speaking to the patient.    Was a Nurse, learning disability used for this call? No    Verified patient's date of birth / HIPAA.    Specialty medication(s) to be sent: Inflammatory Disorders: Dupixent      Non-specialty medications/supplies to be sent: sharps kit      Medications not needed at this time: na         Dupixent (dupilumab)    Medication & Administration     Dosage:  Atopic dermatitis: Inject 600mg  under the skin as a loading dose followed by 300mg  every 14 days thereafter    Administration:     Dupixent Pen  1. Gather all supplies needed for injection on a clean, flat working surface: medication syringe removed from packaging, alcohol swab, sharps container, etc.  2. Look at the medication label - look for correct medication, correct dose, and check the expiration date  3. Look at the medication - the liquid in the pen should appear clear and colorless to pale yellow  4. Lay the pen on a flat surface and allow it to warm up to room temperature for at least 45 minutes  5. Select injection site - you can use the front of your thigh or your belly (but not the area 2 inches around your belly button); if someone else is giving you the injection you can also use your upper arm in the skin covering your triceps muscle  6. Prepare injection site - wash your hands and clean the skin at the injection site with an alcohol swab and let it air dry, do not touch the injection site again before the injection  7. Hold the middle of the body of the pen and gently pull the needle safety cap straight out. Be careful not to bend the needle. Do not remove until immediately prior to injection  8. Press the pen down onto the injection site at a 90 degree angle.   9. You will hear a click as the injection starts, and then a second click when the injection is ALMOST done. Keep holding the pen against the skin for 5 more seconds after the second click.   10. Check that the pen is empty by looking in the viewing window - the yellow indicator bar should be stopped, and should fill the window.   11. Remove the pen from the skin by lifting straight up.   12. Dispose of the used pen immediately in your sharps disposal container  13. If you see any blood at the injection site, press a cotton ball or gauze on the site and maintain pressure until the bleeding stops, do not rub the injection site    Adherence/Missed dose instructions:  If a dose is missed, administer within 7 days from the missed dose and then resume the original schedule. If the missed dose is not administered within 7 days, you can either wait until the next dose on the original schedule or take your dose now and resume every 14 days from the new injection date. Do not use 2 doses at the same time or extra doses.      Goals of Therapy     -Reduce symptoms of pruritus and dermatitis  -Prevent exacerbations  -Minimize therapeutic risks  -Avoidance of long-term systemic and topical glucocorticoid use  -  Maintenance of effective psychosocial functioning    Side Effects & Monitoring Parameters     Injection site reaction (redness, irritation, inflammation localized to the site of administration)  Signs of a common cold - minor sore throat, runny or stuffy nose, etc.  Recurrence of cold sores (herpes simplex)      The following side effects should be reported to the provider:  Signs of a hypersensitivity reaction - rash; hives; itching; red, swollen, blistered, or peeling skin; wheezing; tightness in the chest or throat; difficulty breathing, swallowing, or talking; swelling of the mouth, face, lips, tongue, or throat; etc.  Eye pain or irritation or any visual disturbances  Shortness of breath or worsening of breathing      Contraindications, Warnings, & Precautions     Have your bloodwork checked as you have been told by your prescriber   Birth control pills and other hormone-based birth control may not work as well to prevent pregnancy  Talk with your doctor if you are pregnant, planning to become pregnant, or breastfeeding  Discuss the possible need for holding your dose(s) of Dupixent?? when a planned procedure is scheduled with the prescriber as it may delay healing/recovery timeline       Drug/Food Interactions     Medication list reviewed in Epic. The patient was instructed to inform the care team before taking any new medications or supplements. No drug interactions identified.   Talk with you prescriber or pharmacist before receiving any live vaccinations while taking this medication and after you stop taking it    Storage, Handling Precautions, & Disposal     Store this medication in the refrigerator.  Do not freeze  If needed, you may store at room temperature for up to 14 days  Store in original packaging, protected from light  Do not shake  Dispose of used syringes in a sharps disposal container            Current Medications (including OTC/herbals), Comorbidities and Allergies     Current Outpatient Medications   Medication Sig Dispense Refill    aspirin (ECOTRIN) 81 MG tablet Take 1 tablet (81 mg total) by mouth daily.      atorvastatin (LIPITOR) 80 MG tablet Take 1 tablet (80 mg total) by mouth daily.      clobetasoL (TEMOVATE) 0.05 % external solution Apply topically Two (2) times a day. To rash on the scalp. 50 mL 0    clobetasoL (TEMOVATE) 0.05 % ointment Apply topically Two (2) times a day. For itchy, raised rash on the body. Do not use on face. 60 g 1    dulaglutide (TRULICITY) 0.75 mg/0.5 mL injection pen Inject 0.5 mL (0.75 mg total) under the skin once a week.      dupilumab 300 mg/2 mL PnIj Inject the contents of 1 pen (300 mg) under the skin every fourteen (14) days as maintenance. 4 mL 10    hydrocortisone 2.5 % ointment Apply 1 application topically Two (2) times a day. To rash on the face until healed. 30 g 1    insulin glargine (LANTUS) 100 unit/mL injection Inject 0.4 mL (40 Units total) under the skin nightly.      irbesartan (AVAPRO) 300 MG tablet Take 1 tablet (300 mg total) by mouth nightly.      lidocaine (XYLOCAINE) 5 % ointment Apply topically Two (2) times a day. To itchy bumps 30 g 6    metFORMIN (GLUCOPHAGE) 1000 MG tablet Take 1 tablet (1,000 mg total) by  mouth in the morning and 1 tablet (1,000 mg total) in the evening. Take with meals.      metoprolol tartrate (LOPRESSOR) 50 MG tablet Take 1 tablet (50 mg total) by mouth Two (2) times a day.      montelukast (SINGULAIR) 10 mg tablet Take 1 tablet (10 mg total) by mouth nightly.      omeprazole (PRILOSEC) 20 MG capsule Take 1 capsule (20 mg total) by mouth daily.      traZODone (DESYREL) 50 MG tablet Take 1 tablet (50 mg total) by mouth nightly.       No current facility-administered medications for this visit.       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    Appropriateness of Therapy     Acute infections noted within Epic:  No active infections  Patient reported infection: None    Is medication and dose appropriate based on diagnosis and infection status? Yes    Prescription has been clinically reviewed: Yes      Baseline Quality of Life Assessment      How many days over the past month did your prurigo nodularis  keep you from your normal activities? For example, brushing your teeth or getting up in the morning. Daily    Financial Information     Medication Assistance provided: Prior Authorization    Anticipated copay of $10.35 for each 2-pack reviewed with patient. Verified delivery address.    Delivery Information     Scheduled delivery date: Friday, 4.7 for load and Wed, 4.19 for maintenance    Expected start date: Friday, 4/7    Medication will be delivered via Same Day Courier to the prescription address in [facility].  This shipment will not require a signature.      Explained the services we provide at [facility] and that each month we would call to set up refills.  Stressed importance of returning phone calls so that we could ensure they receive their medications in time each month.  Informed patient that we should be setting up refills 7-10 days prior to when they will run out of medication.  A pharmacist will reach out to perform a clinical assessment periodically.  Informed patient that a welcome packet, containing information about our pharmacy and other support services, a Notice of Privacy Practices, and a drug information handout will be sent.      The patient or caregiver noted above participated in the development of this care plan and knows that they can request review of or adjustments to the care plan at any time.      Patient or caregiver verbalized understanding of the above information as well as how to contact the pharmacy at [phone] option 4 with any questions/concerns.  The pharmacy is open Monday through Friday 8:30am-4:30pm.  A pharmacist is available 24/7 via pager to answer any clinical questions they may have.    Patient Specific Needs     Does the patient have any physical, cognitive, or cultural barriers? No    Does the patient have adequate living arrangements? (i.e. the ability to store and take their medication appropriately) Yes    Did you identify any home environmental safety or security hazards? No  Patient prefers to have medications discussed with  Patient     Is the patient or caregiver able to read and understand education materials at a high school level or above? Yes    Patient's primary language is  English     Is the patient high risk? No  Mallory Enriques A Desiree Lucy [facility] Specialty Pharmacist

## 2021-06-01 MED FILL — EMPTY CONTAINER: 120 days supply | Qty: 1 | Fill #0

## 2021-06-07 ENCOUNTER — Other Ambulatory Visit: Payer: Self-pay | Admitting: Internal Medicine

## 2021-06-07 ENCOUNTER — Ambulatory Visit: Payer: Self-pay | Admitting: *Deleted

## 2021-06-07 ENCOUNTER — Other Ambulatory Visit: Payer: Self-pay

## 2021-06-07 DIAGNOSIS — E118 Type 2 diabetes mellitus with unspecified complications: Secondary | ICD-10-CM

## 2021-06-07 MED ORDER — ACCU-CHEK SMARTVIEW VI STRP: ORAL_STRIP | 2 refills | Status: DC

## 2021-06-07 NOTE — Telephone Encounter (Signed)
Called pt left VM that [facility] ? ?KP ?

## 2021-06-07 NOTE — Telephone Encounter (Signed)
Medication Refill - Medication: glucose blood (ACCU-CHEK SMARTVIEW) test strip ? ?Pt stated [facility] sent her the wrong test strips, and she only has three test strips left. [facility] asked the pt to contact their PCP to ask for a 1 month refill while they shipped the pt the correct test strips. ? ?Has the patient contacted their pharmacy? Yes.   ? ?(Agent: If yes, when and what did the pharmacy advise?) ? ?Preferred Pharmacy (with phone number or street name):  ?[id number] [place] ?[address] [place] [address] ?Phone: [phone]: [phone] ?Hours: Not open 24 hours  ? ?Has the patient been seen for an appointment in the last year OR does the patient have an upcoming appointment? Yes.   ? ?Agent: Please be advised that RX refills may take up to 3 business days. We ask that you follow-up with your pharmacy.  ?

## 2021-06-07 NOTE — Telephone Encounter (Signed)
[facility] ? ?KP ?

## 2021-06-07 NOTE — Telephone Encounter (Signed)
?  Chief Complaint: Wrong test strips sent from [place] ?Symptoms: NA ?Frequency: NA ?Pertinent Negatives: Patient denies NA ?Disposition: '[]'$ ED /'[]'$ Urgent Care (no appt availability in office) / '[]'$ Appointment(In office/virtual)/ '[]'$  [place] [facility]/ '[]'$ Home Care/ '[]'$ Refused Recommended Disposition /'[]'$ [place] [facility]/ '[x]'$  Follow-up with PCP ?Additional Notes: Pt states [facility] sent wrong test strips. States they advised to have provider [facility] #[facility] [phone] ? Reason for Disposition ? [1] Prescription refill request for ESSENTIAL medicine (i.e., likelihood of harm to patient if not taken) AND [2] triager unable to refill per department policy ? ?Answer Assessment - Initial Assessment Questions ?1. DRUG NAME: "What medicine do you need to have refilled?" ?    One Touch Smart view strips ?2. REFILLS REMAINING: "How many refills are remaining?" (Note: The label on the medicine or pill bottle will show how many refills are remaining. If there are no refills remaining, then a renewal may be needed.) ?    Has 2 strips left ? ?Protocols used: Medication Refill and Renewal Call-A-AH ? ?

## 2021-06-13 MED FILL — DUPIXENT 300 MG/2 ML SUBCUTANEOUS PEN INJECTOR: SUBCUTANEOUS | 28 days supply | Qty: 4 | Fill #0

## 2021-06-29 ENCOUNTER — Other Ambulatory Visit: Payer: Self-pay

## 2021-06-29 DIAGNOSIS — E118 Type 2 diabetes mellitus with unspecified complications: Secondary | ICD-10-CM

## 2021-06-29 MED ORDER — ACCU-CHEK SMARTVIEW VI STRP: ORAL_STRIP | 2 refills | Status: DC

## 2021-07-02 ENCOUNTER — Telehealth: Payer: Self-pay | Admitting: Internal Medicine

## 2021-07-02 ENCOUNTER — Other Ambulatory Visit: Payer: Self-pay | Admitting: Internal Medicine

## 2021-07-02 DIAGNOSIS — E118 Type 2 diabetes mellitus with unspecified complications: Secondary | ICD-10-CM

## 2021-07-02 NOTE — Telephone Encounter (Signed)
Pt called requested SmartView AccuCheck Nano test strips, she says that she is completely out.  ? ?[id number] [place] ?[address] [place] [address] ?Phone: [phone]: [phone] ? ?

## 2021-07-02 NOTE — Telephone Encounter (Signed)
Patient states she's unable to check her blood sugar and would like  a short supply of SmartView AccuCheck Nano test strips, please send to  ? ? ?[name]  [phone] ?Fax:  [phone] ?  ? ?

## 2021-07-03 DIAGNOSIS — L281 Prurigo nodularis: Principal | ICD-10-CM

## 2021-07-03 DIAGNOSIS — L259 Unspecified contact dermatitis, unspecified cause: Principal | ICD-10-CM

## 2021-07-03 MED ORDER — HYDROCORTISONE 2.5 % TOPICAL OINTMENT
Freq: Two times a day (BID) | TOPICAL | 1 refills | 0 days | Status: CP
Start: 2021-07-03 — End: 2022-07-03

## 2021-07-03 MED ORDER — CLOBETASOL 0.05 % TOPICAL OINTMENT
Freq: Two times a day (BID) | TOPICAL | 1 refills | 0 days | Status: CP
Start: 2021-07-03 — End: 2022-07-03

## 2021-07-03 MED ORDER — CLOBETASOL 0.05 % SCALP SOLUTION
Freq: Two times a day (BID) | TOPICAL | 0 refills | 0 days | Status: CP
Start: 2021-07-03 — End: 2022-07-03

## 2021-07-03 NOTE — Telephone Encounter (Signed)
Requested Prescriptions  ?Pending Prescriptions Disp Refills  ?? glimepiride (AMARYL) 2 MG tablet [Pharmacy Med Name: Glimepiride 2 MG Oral Tablet] 90 tablet 3  ?  Sig: TAKE 1 TABLET BY MOUTH DAILY  WITH BREAKFAST  ?  ? Endocrinology:  Diabetes - Sulfonylureas Passed - 07/02/2021  4:37 AM  ?  ?  Passed - HBA1C is between 0 and 7.9 and within 180 days  ?  Hemoglobin A1C  ?Date Value Ref Range Status  ?01/26/2021 7.2  Final  ?   ?  ?  Passed - Cr in normal range and within 360 days  ?  Creatinine, Ser  ?Date Value Ref Range Status  ?04/20/2021 0.94 0.44 - 1.00 mg/dL Final  ?   ?  ?  Passed - Valid encounter within last 6 months  ?  Recent Outpatient Visits   ?      ? 2 months ago Acute non-recurrent maxillary sinusitis  ? [facility] Glean Hess, MD  ? 2 months ago Ritta Slot  ? [facility] Glean Hess, MD  ? 2 months ago Acute cough  ? [facility] Glean Hess, MD  ? 4 months ago Essential hypertension  ? [facility] Glean Hess, MD  ? 8 months ago Rash and nonspecific skin eruption  ? [facility] Glean Hess, MD  ?  ?  ?Future Appointments   ?        ? In 1 month Army Melia Jesse Sans, MD [facility] [place]  ?  ? ?  ?  ?  ? ?

## 2021-07-03 NOTE — Unmapped (Signed)
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 I recommended she use her topicals more frequently for now - she requested refills be sent to [facility].    [facility] Clinical Assessment & Refill Coordination Note    Monique Coleman, DOB: 1950/11/25  Phone: [phone] (home)     All above HIPAA information was verified with patient.     Was a Nurse, learning disability used for this call? No    Specialty Medication(s):   Inflammatory Disorders: Dupixent     Current Outpatient Medications   Medication Sig Dispense Refill    aspirin (ECOTRIN) 81 MG tablet Take 1 tablet (81 mg total) by mouth daily.      atorvastatin (LIPITOR) 80 MG tablet Take 1 tablet (80 mg total) by mouth daily.      clobetasoL (TEMOVATE) 0.05 % external solution Apply topically Two (2) times a day. To rash on the scalp. 50 mL 0    clobetasoL (TEMOVATE) 0.05 % ointment Apply topically Two (2) times a day. For itchy, raised rash on the body. Do not use on face. 60 g 1    dulaglutide (TRULICITY) 0.75 mg/0.5 mL injection pen Inject 0.5 mL (0.75 mg total) under the skin once a week.      dupilumab 300 mg/2 mL PnIj Inject the contents of 1 pen (300 mg) under the skin every fourteen (14) days as maintenance. 4 mL 10    empty container Misc Use as directed to dispose of Dupixent 1 each 2    hydrocortisone 2.5 % ointment Apply 1 application topically Two (2) times a day. To rash on the face until healed. 30 g 1    insulin glargine (LANTUS) 100 unit/mL injection Inject 0.4 mL (40 Units total) under the skin nightly.      irbesartan (AVAPRO) 300 MG tablet Take 1 tablet (300 mg total) by mouth nightly.      lidocaine (XYLOCAINE) 5 % ointment Apply topically Two (2) times a day. To itchy bumps 30 g 6    metFORMIN (GLUCOPHAGE) 1000 MG tablet Take 1 tablet (1,000 mg total) by mouth in the morning and 1 tablet (1,000 mg total) in the evening. Take with meals.      metoprolol tartrate (LOPRESSOR) 50 MG tablet Take 1 tablet (50 mg total) by mouth Two (2) times a day.      montelukast (SINGULAIR) 10 mg tablet Take 1 tablet (10 mg total) by mouth nightly.      omeprazole (PRILOSEC) 20 MG capsule Take 1 capsule (20 mg total) by mouth daily.      traZODone (DESYREL) 50 MG tablet Take 1 tablet (50 mg total) by mouth nightly.       No current facility-administered medications for this visit.        Changes to medications: Djuana reports no changes at this time.    No Known Allergies    Changes to allergies: No    SPECIALTY MEDICATION ADHERENCE   Dupixent - 0 left     Specialty medication(s) dose(s) confirmed: Regimen is correct and unchanged.     Are there any concerns with adherence? No    Adherence counseling provided? Not needed    CLINICAL MANAGEMENT AND INTERVENTION      Clinical Benefit Assessment:    Do you feel the medicine is effective or helping your condition? Yes    Clinical Benefit counseling provided? Not needed    Adverse Effects Assessment:  Are you experiencing any side effects? No    Are you experiencing difficulty administering your medicine? No    Quality of Life Assessment:    Quality of Life    Rheumatology  Oncology  Dermatology  Cystic Fibrosis          Have you discussed this with your provider? Not needed    Acute Infection Status:    Acute infections noted within Epic:  No active infections  Patient reported infection: None    Therapy Appropriateness:    Is therapy appropriate and patient progressing towards therapeutic goals? Yes, therapy is appropriate and should be continued    DISEASE/MEDICATION-SPECIFIC INFORMATION      For patients on injectable medications: Patient currently has 0 doses left.  Next injection is scheduled for Friday, 5/19.    PATIENT SPECIFIC NEEDS     Does the patient have any physical, cognitive, or cultural barriers? No    Is the patient high risk? No    Does the patient require a Care Management Plan? No     SHIPPING     Specialty Medication(s) to be Shipped:   Inflammatory Disorders: Dupixent    Other medication(s) to be shipped: No additional medications requested for fill at this time     Changes to insurance: No    Delivery Scheduled: Yes, Expected medication delivery date: Tues, 5/16.     Medication will be delivered via Same Day Courier to the confirmed prescription address in [facility].    The patient will receive a drug information handout for each medication shipped and additional FDA Medication Guides as required.  Verified that patient has previously received a Conservation officer, historic buildings and a Surveyor, mining.    The patient or caregiver noted above participated in the development of this care plan and knows that they can request review of or adjustments to the care plan at any time.      All of the patient's questions and concerns have been addressed.    Lanney Gins   [facility] Specialty Pharmacist

## 2021-07-05 ENCOUNTER — Ambulatory Visit: Payer: Self-pay

## 2021-07-05 NOTE — Telephone Encounter (Signed)
?  Chief Complaint: shoulder pain  ?Symptoms: R shoulder pain and unable to lift arm, pain 9/10 ?Frequency: 2-3 days  ?Pertinent Negatives: NA ?Disposition: '[]'$ ED /'[]'$ Urgent Care (no appt availability in office) / '[x]'$ Appointment(In office/virtual)/ '[]'$  [place] [facility]/ '[]'$ Home Care/ '[]'$ Refused Recommended Disposition /'[]'$ [place] [facility]/ '[]'$  Follow-up with PCP ?Additional Notes: pt states she has tried everything like Tylenol, bengay cream and pain patches and nothing has helped. Pain at shoulder radiates into neck as well. Scheduled appt for tomorrow at  1340 with PCP.  ? ? ?Reason for Disposition ? [1] MODERATE pain (e.g., interferes with normal activities) AND [2] present > 3 days ? ?Answer Assessment - Initial Assessment Questions ?1. ONSET: "When did the pain start?" ?    This morning ?2. LOCATION: "Where is the pain located?" ?    R arm, shoulder  ?3. PAIN: "How bad is the pain?" (Scale 1-10; or mild, moderate, severe) ?  - MILD (1-3): doesn't interfere with normal activities ?  - MODERATE (4-7): interferes with normal activities (e.g., work or school) or awakens from sleep ?  - SEVERE (8-10): excruciating pain, unable to do any normal activities, unable to hold a cup of water ?    9 ?6. OTHER SYMPTOMS: "Do you have any other symptoms?" (e.g., neck pain, swelling, rash, fever, numbness, weakness) ?    Neck pain ? ?Protocols used: Arm Pain-A-AH ? ?

## 2021-07-06 ENCOUNTER — Ambulatory Visit (INDEPENDENT_AMBULATORY_CARE_PROVIDER_SITE_OTHER): Payer: Medicare Other | Admitting: Internal Medicine

## 2021-07-06 ENCOUNTER — Ambulatory Visit
Admission: RE | Admit: 2021-07-06 | Discharge: 2021-07-06 | Disposition: A | Discharge status: Home / Self Care | Payer: Medicare Other | Attending: Internal Medicine | Admitting: Internal Medicine

## 2021-07-06 ENCOUNTER — Encounter: Payer: Self-pay | Admitting: Internal Medicine

## 2021-07-06 ENCOUNTER — Ambulatory Visit
Admission: RE | Admit: 2021-07-06 | Discharge: 2021-07-06 | Disposition: A | Discharge status: Home / Self Care | Payer: Medicare Other | Source: Ambulatory Visit | Attending: Internal Medicine | Admitting: Internal Medicine

## 2021-07-06 VITALS — BP 110/82 | HR 97 | Ht 59.0 in | Wt 137.0 lb

## 2021-07-06 DIAGNOSIS — R21 Rash and other nonspecific skin eruption: Secondary | ICD-10-CM

## 2021-07-06 DIAGNOSIS — G8929 Other chronic pain: Secondary | ICD-10-CM | POA: Insufficient documentation

## 2021-07-06 DIAGNOSIS — E1169 Type 2 diabetes mellitus with other specified complication: Secondary | ICD-10-CM | POA: Diagnosis not present

## 2021-07-06 DIAGNOSIS — M25511 Pain in right shoulder: Secondary | ICD-10-CM | POA: Diagnosis not present

## 2021-07-06 DIAGNOSIS — Z794 Long term (current) use of insulin: Secondary | ICD-10-CM | POA: Diagnosis not present

## 2021-07-06 DIAGNOSIS — E1142 Type 2 diabetes mellitus with diabetic polyneuropathy: Secondary | ICD-10-CM | POA: Diagnosis not present

## 2021-07-06 DIAGNOSIS — I152 Hypertension secondary to endocrine disorders: Secondary | ICD-10-CM | POA: Diagnosis not present

## 2021-07-06 DIAGNOSIS — E118 Type 2 diabetes mellitus with unspecified complications: Secondary | ICD-10-CM | POA: Diagnosis not present

## 2021-07-06 DIAGNOSIS — E1159 Type 2 diabetes mellitus with other circulatory complications: Secondary | ICD-10-CM | POA: Diagnosis not present

## 2021-07-06 DIAGNOSIS — E785 Hyperlipidemia, unspecified: Secondary | ICD-10-CM | POA: Diagnosis not present

## 2021-07-06 LAB — HEMOGLOBIN A1C: Hemoglobin A1C: 7

## 2021-07-06 NOTE — Progress Notes (Signed)
? ? ?Date:  07/06/2021  ? ?Name:  Elizabeth Klein   DOB:  [birth year]   MRN:  [id number] ? ? ?Chief Complaint: Shoulder Pain (X4 days,right, tylenol /) and Rash (X1 day, Right shoulder, redness, small, bumps itches ) ? ?Shoulder Pain  ?The pain is present in the right shoulder. This is a new problem. Episode onset: x4 days. The problem occurs constantly. The problem has been gradually worsening. The quality of the pain is described as sharp. The pain is at a severity of 6/10. The pain is mild. Associated symptoms include itching. Pertinent negatives include no fever. She has tried acetaminophen for the symptoms. The treatment provided mild relief. Her past medical history is significant for diabetes.  ?Rash ?This is a new problem. The current episode started yesterday. The problem has been gradually worsening since onset. The affected locations include the right shoulder. The rash is characterized by redness and itchiness. She was exposed to nothing. Pertinent negatives include no fatigue, fever or shortness of breath. Treatments tried: topical otc cream.  ? ?Lab Results  ?Component Value Date  ? NA 137 04/20/2021  ? K 4.7 04/20/2021  ? CO2 22 04/20/2021  ? GLUCOSE 141 (H) 04/20/2021  ? BUN 28 (H) 04/20/2021  ? CREATININE 0.94 04/20/2021  ? CALCIUM 8.9 04/20/2021  ? EGFR 52 (L) 08/23/2020  ? GFRNONAA >60 04/20/2021  ? ?Lab Results  ?Component Value Date  ? CHOL 110 08/23/2020  ? HDL 40 08/23/2020  ? [place] 47 08/23/2020  ? TRIG 128 08/23/2020  ? CHOLHDL 2.8 08/23/2020  ? ?Lab Results  ?Component Value Date  ? TSH 1.690 08/23/2020  ? ?Lab Results  ?Component Value Date  ? HGBA1C 7.0 07/06/2021  ? ?Lab Results  ?Component Value Date  ? WBC 14.1 (H) 04/20/2021  ? HGB 11.9 (L) 04/20/2021  ? HCT 38.1 04/20/2021  ? MCV 90.3 04/20/2021  ? PLT 442 (H) 04/20/2021  ? ?Lab Results  ?Component Value Date  ? ALT 18 04/20/2021  ? AST 21 04/20/2021  ? ALKPHOS 69 04/20/2021  ? BILITOT 0.5 04/20/2021  ? ?Lab Results  ?Component  Value Date  ? VD25OH 32.6 06/02/2017  ?  ? ?Review of Systems  ?Constitutional:  Negative for chills, fatigue and fever.  ?Respiratory:  Negative for chest tightness and shortness of breath.   ?Cardiovascular:  Negative for chest pain.  ?Musculoskeletal:  Positive for arthralgias and myalgias.  ?Skin:  Positive for itching and rash.  ?Psychiatric/Behavioral:  Negative for dysphoric mood and sleep disturbance. The patient is not nervous/anxious.   ? ?Patient Active Problem List  ? Diagnosis Date Noted  ? Degeneration of lumbar intervertebral disc 04/19/2021  ? Oropharyngeal dysphagia 02/23/2019  ? Neuropathy due to type 2 diabetes mellitus ([place]) 08/20/2018  ? Microalbuminuria 04/30/2017  ? CKD (chronic kidney disease) stage 3, GFR 30-59 ml/min (HCC) 04/30/2017  ? Type II diabetes mellitus with complication ([place]) 42/70/6237  ? Essential hypertension 03/19/2017  ? CAD (coronary artery disease) 03/19/2017  ? History of kidney stones 03/19/2017  ? Psoriasis 03/19/2017  ? Hyperlipidemia associated with type 2 diabetes mellitus ([place]) 03/19/2017  ? Nocturnal leg cramps 03/19/2017  ? Gait instability 03/19/2017  ? Insomnia 03/19/2017  ? Allergic rhinitis 03/19/2017  ? ? ?Allergies  ?Allergen Reactions  ? Prednisone Other (See Comments)  ?  Intolerance  - confusion ?  ? ? ?Past Surgical History:  ?Procedure Laterality Date  ? BREAST BIOPSY Right   ? neg  ? CATARACT EXTRACTION W/PHACO Left  09/27/2019  ? Procedure: CATARACT EXTRACTION PHACO AND INTRAOCULAR LENS PLACEMENT (IOC) LEFT DIABETIC ISTENT INJ INTRAVITREAL KENALOG INJ;  Surgeon: Eulogio Bear, MD;  Location: [place] [place];  Service: Ophthalmology;  Laterality: Left;  2.83 ?0:28.7  ? CATARACT EXTRACTION W/PHACO Right 12/13/2019  ? Procedure: CATARACT EXTRACTION PHACO AND INTRAOCULAR LENS PLACEMENT ([place]) RIGHT ISTENT INJ KENALOG INJ DIABETIC;  Surgeon: Eulogio Bear, MD;  Location: [facility];  Service: Ophthalmology;  Laterality: Right;   1.39 ?0:21.5  ? CESAREAN SECTION    ? ? ?Social History  ? ?Tobacco Use  ? Smoking status: Never  ? Smokeless tobacco: Never  ?Vaping Use  ? Vaping Use: Never used  ?Substance Use Topics  ? Alcohol use: Never  ? Drug use: Never  ? ? ? ?Medication list has been reviewed and updated. ? ?Current Meds  ?Medication Sig  ? acetaminophen (TYLENOL) 500 MG tablet Take 1,000 mg by mouth every 8 (eight) hours as needed.  ? Ascorbic Acid (VITAMIN C PO) Take by mouth.  ? aspirin EC 81 MG tablet Take 1 tablet (81 mg total) by mouth daily.  ? atorvastatin (LIPITOR) 80 MG tablet TAKE 1 TABLET BY MOUTH  DAILY  ? BD INSULIN SYRINGE U/F 30G X 1/2" 0.5 ML MISC USE WITH LANTUS INJECTIONS  FOR TYPE 2 DIABETES  MELLITUS  ? clobetasol (TEMOVATE) 0.05 % external solution Apply topically 2 (two) times daily. To scalp lesions  ? clobetasol ointment (TEMOVATE) 8.67 % Apply 1 application topically 2 (two) times daily.  ? DUPIXENT 300 MG/2ML SOPN Inject into the skin every 14 (fourteen) days.  ? glimepiride (AMARYL) 2 MG tablet TAKE 1 TABLET BY MOUTH DAILY  WITH BREAKFAST  ? glucose blood (ACCU-CHEK SMARTVIEW) test strip USE 3 TIMES DAILY  ? hydrocortisone 2.5 % ointment Apply topically 2 (two) times daily. To lesion on arm and buttocks  ? insulin glargine (LANTUS) 100 UNIT/ML injection Inject 0.4 mLs (40 Units total) into the skin every morning. Pt taking QAM (Patient taking differently: Inject 27 Units into the skin every morning. Pt taking QAM)  ? lidocaine (XYLOCAINE) 5 % ointment Apply topically.  ? losartan (COZAAR) 25 MG tablet Take 1 tablet (25 mg total) by mouth daily.  ? losartan (COZAAR) 50 MG tablet TAKE 1 TABLET BY MOUTH  DAILY  ? metFORMIN (GLUCOPHAGE) 1000 MG tablet Take 1 tablet (1,000 mg total) by mouth 2 (two) times daily with a meal.  ? metoprolol tartrate (LOPRESSOR) 50 MG tablet Take 1 tablet (50 mg total) by mouth 2 (two) times daily.  ? montelukast (SINGULAIR) 10 MG tablet TAKE 1 TABLET BY MOUTH  DAILY  ? nystatin  (MYCOSTATIN) 100000 UNIT/ML suspension 1 teaspoon  swish, gargle and spit four times per day.  ? Omega-3 Fatty Acids (FISH OIL) 1000 MG CAPS Take by mouth.  ? omeprazole (PRILOSEC) 20 MG capsule TAKE 1 CAPSULE BY MOUTH  DAILY  ? ONETOUCH DELICA PLUS LANCETS MISC 1 each by Does not apply route 2 (two) times daily.  ? promethazine-dextromethorphan (PROMETHAZINE-DM) 6.25-15 MG/5ML syrup Take 5 mLs by mouth 4 (four) times daily as needed for cough.  ? Semaglutide, 1 MG/DOSE, (OZEMPIC, 1 MG/DOSE,) 4 MG/3ML SOPN Inject into the skin. Inject 1 mg into the skin every 7 days  ? traZODone (DESYREL) 50 MG tablet TAKE 1 TABLET BY MOUTH AT  BEDTIME  ? vitamin B-12 (CYANOCOBALAMIN) 1000 MCG tablet Take 1,000 mcg by mouth daily.  ? [DISCONTINUED] loratadine (CLARITIN) 10 MG tablet Take 10  mg by mouth daily.  ? ? ? ?  07/06/2021  ?  1:58 PM 04/24/2021  ?  2:06 PM 04/19/2021  ? 11:40 AM 04/11/2021  ?  2:00 PM  ?GAD 7 : Generalized Anxiety Score  ?Nervous, Anxious, on Edge 1 1 0 1  ?Control/stop worrying _0 ?Worry too much - different things 1 0 1 1  ?Trouble relaxing 1 0 1 1  ?Restless 1 1 0 1  ?Easily annoyed or irritable 1 0 1 1  ?Afraid - awful might happen 1 0 1 1  ?Total GAD 7 Score _1 ?Anxiety Difficulty  Not difficult at all  Not difficult at all  ? ? ? ?  07/06/2021  ?  1:58 PM  ?Depression screen PHQ 2/9  ?Decreased Interest 0  ?Down, Depressed, Hopeless 0  ?PHQ - 2 Score 0  ?Altered sleeping 0  ?Tired, decreased energy 0  ?Change in appetite 0  ?Feeling bad or failure about yourself  0  ?Trouble concentrating 2  ?Moving slowly or fidgety/restless 2  ?Suicidal thoughts 0  ?PHQ-9 Score 4  ?Difficult doing work/chores Not difficult at all  ? ? ?BP Readings from Last 3 Encounters:  ?07/06/21 110/82  ?04/24/21 118/70  ?04/20/21 132/78  ? ? ?Physical Exam ?Vitals and nursing note reviewed.  ?Constitutional:   ?   General: She is not in acute distress. ?   Appearance: Normal appearance. She is well-developed.  ?HENT:  ?    Head: Normocephalic and atraumatic.  ?Cardiovascular:  ?   Rate and Rhythm: Normal rate and regular rhythm.  ?Pulmonary:  ?   Effort: Pulmonary effort is normal. No respiratory distress.  ?   Breath sounds: No wheezing or

## 2021-07-06 NOTE — Patient Instructions (Addendum)
Take Allegra 180 mg or Claritin 10 mg once a day for itching ? ?Benadryl 25 mg three times a day for itching ? ?Continue Tylenol 2 tabs every 8 hours for pain in shoulder ?

## 2021-07-09 ENCOUNTER — Ambulatory Visit: Payer: Medicare Other | Admitting: Family Medicine

## 2021-07-09 ENCOUNTER — Other Ambulatory Visit: Payer: Self-pay | Admitting: Internal Medicine

## 2021-07-09 ENCOUNTER — Encounter: Payer: Self-pay | Admitting: Family Medicine

## 2021-07-09 VITALS — BP 108/74 | HR 100 | Ht 59.0 in | Wt 137.0 lb

## 2021-07-09 DIAGNOSIS — M7541 Impingement syndrome of right shoulder: Secondary | ICD-10-CM | POA: Diagnosis not present

## 2021-07-09 DIAGNOSIS — I2510 Atherosclerotic heart disease of native coronary artery without angina pectoris: Secondary | ICD-10-CM

## 2021-07-09 DIAGNOSIS — I10 Essential (primary) hypertension: Secondary | ICD-10-CM

## 2021-07-09 DIAGNOSIS — M62838 Other muscle spasm: Secondary | ICD-10-CM

## 2021-07-09 MED ORDER — MELOXICAM 7.5 MG PO TABS: 7.5000 mg | ORAL_TABLET | Freq: Every day | ORAL | 0 refills | Status: AC

## 2021-07-09 NOTE — Patient Instructions (Signed)
-   Take meloxicam daily x 1 week ?- Continue meloxicam into week 2 if symptoms persist ?- Use ice and heat at 20 minute intervals ?- Start home exercises in 2 days and continue ?- Return in 2 weeks ?- Contact for questions ? ?

## 2021-07-09 NOTE — Progress Notes (Signed)
?  ? ?  Primary Care / Sports Medicine Office Visit ? ?Patient Information:  ?Patient ID: Elizabeth Klein, female DOB: 09-18-1950 Age: 71 y.o. MRN: [id number]  ? ?Elizabeth Klein is a pleasant 71 y.o. female presenting with the following: ? ?Chief Complaint  ?Patient presents with  ? Shoulder Pain  ?  Right   ? ? ?Vitals:  ? 07/09/21 1401  ?BP: 108/74  ?Pulse: 100  ?SpO2: 98%  ? ?Vitals:  ? 07/09/21 1401  ?Weight: 137 lb (62.1 kg)  ?Height: '4\' 11"'$  (1.499 m)  ? ?Body mass index is 27.67 kg/m?. ? ?No results found.  ? ?Independent interpretation of notes and tests performed by another provider:  ? ?Independent interpretation of recent right shoulder x-ray reveals mild inferior glenohumeral osteoarthritis, subacromial inferior cortical roughening consistent with degenerative changes, no acute osseous processes noted ? ?Procedures performed:  ? ?None ? ?Pertinent History, Exam, Impression, and Recommendations:  ? ?Problem List Items Addressed This Visit   ? ?  ? Musculoskeletal and Integument  ? Impingement syndrome of shoulder, right - Primary  ?  Right-hand-dominant patient presenting with roughly 7-10-day history of atraumatic right posterolateral shoulder pain, has noted this mildly progressing to the left posterior shoulder.  Pain aggravated with overhead motion, reaching, interfering with ADLs.  Denies any similar symptoms in the past, saw her PCP Dr. Halina Maidens last week who ordered x-rays and advised follow-up here. ? ?Examination today reveals limited range of motion actively due to pain, passive flexion abduction limited due to guarding, cannot exclude mechanical obstruction, rotator cuff testing reveals 5 -/5 strength with isolated supraspinatus and external rotation, 5/5 strength with internal rotation, maximal pain with isolated supraspinatus testing, mild discomfort with resisted external rotation.  She has positive impingement features and has tenderness throughout the paraspinal cervical  musculature with focality through the right greater than left trapezius, levator scapulae, additionally at the subacromial space.  Provocative testing otherwise benign inclusive of Spurling's test. ? ?While her x-rays do demonstrate mild glenohumeral osteoarthritis and subacromial cortical roughening, her features are most consistent with rotator cuff based impingement with secondary/compensatory cervical paraspinal muscular spasm.  I have reviewed the same with the patient as well as treatment strategies.  She is to initiate scheduled meloxicam x1 week, low threshold to continue into week 2, supportive care with heat and ice, and home exercises.  For recalcitrant symptomatology at her return in 2 weeks, and subacromial corticosteroid injections under ultrasound guidance to be considered. ? ?  ?  ? Relevant Medications  ? meloxicam (MOBIC) 7.5 MG tablet  ?  ? Other  ? Cervical paraspinal muscle spasm  ?  ? ?Orders & Medications ?Meds ordered this encounter  ?Medications  ? meloxicam (MOBIC) 7.5 MG tablet  ?  Sig: Take 1 tablet (7.5 mg total) by mouth daily.  ?  Dispense:  14 tablet  ?  Refill:  0  ? ?No orders of the defined types were placed in this encounter. ?  ? ?Return in about 2 weeks (around 07/23/2021) for follow-up.  ?  ? ?Montel Culver, MD ? ? Primary Care Sports Medicine ?[facility] ?[place]  ? ?

## 2021-07-09 NOTE — Assessment & Plan Note (Signed)
Right-hand-dominant patient presenting with roughly 7-10-day history of atraumatic right posterolateral shoulder pain, has noted this mildly progressing to the left posterior shoulder.  Pain aggravated with overhead motion, reaching, interfering with ADLs.  Denies any similar symptoms in the past, saw her PCP Dr. Halina Maidens last week who ordered x-rays and advised follow-up here. ? ?Examination today reveals limited range of motion actively due to pain, passive flexion abduction limited due to guarding, cannot exclude mechanical obstruction, rotator cuff testing reveals 5 -/5 strength with isolated supraspinatus and external rotation, 5/5 strength with internal rotation, maximal pain with isolated supraspinatus testing, mild discomfort with resisted external rotation.  She has positive impingement features and has tenderness throughout the paraspinal cervical musculature with focality through the right greater than left trapezius, levator scapulae, additionally at the subacromial space.  Provocative testing otherwise benign inclusive of Spurling's test. ? ?While her x-rays do demonstrate mild glenohumeral osteoarthritis and subacromial cortical roughening, her features are most consistent with rotator cuff based impingement with secondary/compensatory cervical paraspinal muscular spasm.  I have reviewed the same with the patient as well as treatment strategies.  She is to initiate scheduled meloxicam x1 week, low threshold to continue into week 2, supportive care with heat and ice, and home exercises.  For recalcitrant symptomatology at her return in 2 weeks, and subacromial corticosteroid injections under ultrasound guidance to be considered. ?

## 2021-07-10 MED FILL — DUPIXENT 300 MG/2 ML SUBCUTANEOUS PEN INJECTOR: SUBCUTANEOUS | 28 days supply | Qty: 4 | Fill #1

## 2021-07-10 NOTE — Telephone Encounter (Signed)
Requested Prescriptions  ?Pending Prescriptions Disp Refills  ?? metoprolol tartrate (LOPRESSOR) 50 MG tablet [Pharmacy Med Name: Metoprolol Tartrate 50 MG Oral Tablet] 180 tablet 1  ?  Sig: TAKE 1 TABLET BY MOUTH TWICE  DAILY  ?  ? Cardiovascular:  Beta Blockers Passed - 07/09/2021  4:32 AM  ?  ?  Passed - Last BP in normal range  ?  BP Readings from Last 1 Encounters:  ?07/09/21 108/74  ?   ?  ?  Passed - Last Heart Rate in normal range  ?  Pulse Readings from Last 1 Encounters:  ?07/09/21 100  ?   ?  ?  Passed - Valid encounter within last 6 months  ?  Recent Outpatient Visits   ?      ? Yesterday Impingement syndrome of shoulder, right  ? [facility] Montel Culver, MD  ? 4 days ago Chronic right shoulder pain  ? [facility] Glean Hess, MD  ? 2 months ago Acute non-recurrent maxillary sinusitis  ? [facility] Glean Hess, MD  ? 2 months ago Ritta Slot  ? [facility] Glean Hess, MD  ? 3 months ago Acute cough  ? [facility] Glean Hess, MD  ?  ?  ?Future Appointments   ?        ? In 2 weeks Zigmund Daniel, Earley Abide, MD [facility], [place]  ? In 1 month Army Melia Jesse Sans, MD [facility], [place]  ?  ? ?  ?  ?  ? ? ?

## 2021-07-10 NOTE — Unmapped (Unsigned)
Dermatology Note     Assessment and Plan:      Benign Lesions/ Findings:   {derm skin check benign A&P:75424}  - Reassurance provided regarding the benign appearance of lesions noted on exam today; no treatment is indicated in the absence of symptoms/changes.  - Reinforced importance of photoprotective strategies including liberal and frequent sunscreen use of a broad-spectrum SPF 30 or greater, use of protective clothing, and sun avoidance for prevention of cutaneous malignancy and photoaging.  Counseled patient on the importance of regular self-skin monitoring as well as routine clinical skin examinations as scheduled.     Personal history of {derm skin check cancer list:75440} ***  - No evidence of recurrence at this time.  - Discussed maintaining vigilance and counseled on sun protection as above.    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   - pt had contact dermatitis to compounded lidocaine/gabapentin  - CONT dupilumab 300 mg subcutaneous q14 days***    - discussed R/B/I dupilumab. Handout provided. Pt will discuss with family and call if she chooses to pursue  - continue clobetasol solution twice daily to scalp as needed   ??    There are no diagnoses linked to this encounter.    The patient was advised to call for an appointment should any new, changing, or symptomatic lesions develop.     RTC: No follow-ups on file. or sooner as needed   _________________________________________________________________      Chief Complaint     Chief Complaint   Patient presents with   ??? Skin Problem     Prurigo medication is being to help per pt.       HPI     Monique Coleman is a 71 y.o. female who presents as a returning patient (last seen 05/16/2021) to Dermatology for follow up of prurigo nodularis. started on Dupixent at [facility]    Today  -     The patient denies any other new or changing lesions or areas of concern.     Pertinent Past Medical History     {derm PMH :75456}    Problem List    None    ***    Family History:   {derm skin check melanoma history:75452}    Past Medical History, Family History, Social History, Medication List, Allergies, and Problem List were reviewed in the rooming section of Epic.     ROS: Other than symptoms mentioned in the HPI, {derm skin check ros:75408::no fevers, chills, or other skin complaints}    Physical Examination     GENERAL: Well-appearing female in no acute distress, resting comfortably.  {PE systems:75418::NEURO: Alert and oriented, answers questions appropriately}  {PE extent:75514}  {PE list:75421}  ***    {PE limitations:75419::All areas not commented on are within normal limits or unremarkable}      (Approved Template 11/08/2019)

## 2021-07-11 ENCOUNTER — Ambulatory Visit
Admit: 2021-07-11 | Discharge: 2021-07-12 | Payer: MEDICARE | Attending: Student in an Organized Health Care Education/Training Program | Primary: Student in an Organized Health Care Education/Training Program

## 2021-07-11 DIAGNOSIS — L281 Prurigo nodularis: Principal | ICD-10-CM

## 2021-07-11 MED ORDER — MUPIROCIN 2 % TOPICAL OINTMENT
Freq: Three times a day (TID) | TOPICAL | 1 refills | 0.00000 days | Status: CP
Start: 2021-07-11 — End: 2021-07-18

## 2021-07-11 NOTE — Unmapped (Addendum)
Use mupirocin ointment 3x/day for 7 days to open sores on scalp

## 2021-07-12 DIAGNOSIS — L281 Prurigo nodularis: Principal | ICD-10-CM

## 2021-07-12 MED ORDER — MUPIROCIN 2 % TOPICAL OINTMENT
Freq: Three times a day (TID) | TOPICAL | 1 refills | 0 days
Start: 2021-07-12 — End: 2021-07-19

## 2021-07-13 ENCOUNTER — Telehealth: Payer: Self-pay | Admitting: Internal Medicine

## 2021-07-13 ENCOUNTER — Other Ambulatory Visit: Payer: Self-pay

## 2021-07-13 DIAGNOSIS — E118 Type 2 diabetes mellitus with unspecified complications: Secondary | ICD-10-CM

## 2021-07-13 MED ORDER — MUPIROCIN 2 % TOPICAL OINTMENT
Freq: Three times a day (TID) | TOPICAL | 1 refills | 0 days | Status: CP
Start: 2021-07-13 — End: 2021-07-20
  Filled 2021-07-25: qty 22, 7d supply, fill #0

## 2021-07-13 MED ORDER — ACCU-CHEK SMARTVIEW VI STRP: ORAL_STRIP | 2 refills | Status: DC

## 2021-07-13 NOTE — Telephone Encounter (Signed)
Copied from [place] [id number]. Topic: General - Other >> Jul 13, 2021  2:46 PM Erick Blinks wrote: Reason for CRM: Pt called to report that [facility] needs additional information for her test strips

## 2021-07-13 NOTE — Telephone Encounter (Signed)
Resent with diagnosis code. 

## 2021-07-18 NOTE — Unmapped (Signed)
I saw and evaluated the patient, participating in the key portions of the service.  I reviewed the resident’s note.  I agree with the resident’s findings and plan.     Meara Wiechman, MD

## 2021-07-23 ENCOUNTER — Other Ambulatory Visit: Payer: Self-pay | Admitting: Internal Medicine

## 2021-07-23 DIAGNOSIS — J309 Allergic rhinitis, unspecified: Secondary | ICD-10-CM

## 2021-07-24 ENCOUNTER — Encounter: Payer: Self-pay | Admitting: Family Medicine

## 2021-07-24 ENCOUNTER — Ambulatory Visit (INDEPENDENT_AMBULATORY_CARE_PROVIDER_SITE_OTHER): Payer: Medicare Other | Admitting: Family Medicine

## 2021-07-24 VITALS — BP 118/78 | HR 78 | Ht 59.0 in | Wt 137.6 lb

## 2021-07-24 DIAGNOSIS — M62838 Other muscle spasm: Secondary | ICD-10-CM

## 2021-07-24 DIAGNOSIS — M7541 Impingement syndrome of right shoulder: Secondary | ICD-10-CM | POA: Diagnosis not present

## 2021-07-24 NOTE — Progress Notes (Signed)
     Primary Care / Sports Medicine Office Visit  Patient Information:  Patient ID: [name], female DOB: 1950/08/18 Age: 71 y.o. MRN: [id number]   Elizabeth Klein is a pleasant 71 y.o. female presenting with the following:  Chief Complaint  Patient presents with   Shoulder Pain    Right shoulder pain follow up, pt states that the medication is helping. Only bothers her some days, has some mobic left.     Vitals:   07/24/21 1017  BP: 118/78  Pulse: 78  SpO2: 99%   Vitals:   07/24/21 1017  Weight: 137 lb 9.6 oz (62.4 kg)  Height: '4\' 11"'$  (1.499 m)   Body mass index is 27.79 kg/m.  DG Shoulder Right  Result Date: 07/09/2021 CLINICAL DATA:  Right shoulder pain. EXAM: RIGHT SHOULDER - 2+ VIEW COMPARISON:  X-ray 09/09/2017. FINDINGS: No fracture or dislocation is seen. There are no focal lytic lesions. There are no abnormal soft tissue calcifications. Minimal bony spurs are noted in the inferior aspect of glenohumeral joint. No significant interval changes are noted. IMPRESSION: No fracture or dislocation is seen. Degenerative changes with minimal bony spurs are noted. Electronically Signed   By: Elmer Picker M.D.   On: 07/09/2021 19:25     Independent interpretation of notes and tests performed by another provider:   None  Procedures performed:   None  Pertinent History, Exam, Impression, and Recommendations:   Problem List Items Addressed This Visit       Musculoskeletal and Integument   Impingement syndrome of shoulder, right - Primary    Patient presents for right shoulder subacromial impingement, at the last visit on 07/09/2021 she was advised to initiate meloxicam 7.5 mg x 1 week with low threshold to continue to week 2, while she has not been on this regimen, she has noted a few day history of nausea, reports improvement in her shoulder pain though without resolution.  Denies any new symptoms from musculoskeletal standpoint.  Examination shows  stable objective findings with persistent positive impingement pain localizing to the supraspinatus.  Given her reported GI side effects, advised her to discontinue meloxicam and any other NSAIDs, to treat her residual symptoms I have recommended subacromial corticosteroid injection.  She will consider this option and contact us if interested in proceeding with the same.  She can utilize supportive care including over the interim         Other   Cervical paraspinal muscle spasm     Orders & Medications No orders of the defined types were placed in this encounter.  No orders of the defined types were placed in this encounter.    Return if symptoms worsen or fail to improve.     Montel Culver, MD   Primary Care Sports Medicine [place]

## 2021-07-24 NOTE — Patient Instructions (Signed)
-   Stop meloxicam - Use ice x 20 minutes over towel - Can consider "kenalog subacromial shoulder injection" with home exercises as next step - Contact if interested in the above - Follow-up as-needed

## 2021-07-24 NOTE — Assessment & Plan Note (Signed)
Patient presents for right shoulder subacromial impingement, at the last visit on 07/09/2021 she was advised to initiate meloxicam 7.5 mg x 1 week with low threshold to continue to week 2, while she has not been on this regimen, she has noted a few day history of nausea, reports improvement in her shoulder pain though without resolution.  Denies any new symptoms from musculoskeletal standpoint.  Examination shows stable objective findings with persistent positive impingement pain localizing to the supraspinatus.  Given her reported GI side effects, advised her to discontinue meloxicam and any other NSAIDs, to treat her residual symptoms I have recommended subacromial corticosteroid injection.  She will consider this option and contact us if interested in proceeding with the same.  She can utilize supportive care including over the interim

## 2021-07-24 NOTE — Telephone Encounter (Signed)
Requested Prescriptions  Pending Prescriptions Disp Refills  . montelukast (SINGULAIR) 10 MG tablet [Pharmacy Med Name: Montelukast Sodium 10 MG Oral Tablet] 90 tablet 3    Sig: TAKE 1 TABLET BY MOUTH DAILY     Pulmonology:  Leukotriene Inhibitors Passed - 07/23/2021  4:24 AM      Passed - Valid encounter within last 12 months    Recent Outpatient Visits          Today Impingement syndrome of shoulder, right   [facility] Montel Culver, MD   2 weeks ago Impingement syndrome of shoulder, right   [facility] Montel Culver, MD   2 weeks ago Chronic right shoulder pain   [facility] Glean Hess, MD   3 months ago Acute non-recurrent maxillary sinusitis   [facility] Glean Hess, MD   3 months ago [facility] Glean Hess, MD      Future Appointments            In 1 month Army Melia, Jesse Sans, MD [facility]

## 2021-07-25 NOTE — Telephone Encounter (Signed)
Pt called requesting for her PCP to send additional information needed for her test strips to be approved  Fax: [phone]

## 2021-07-26 ENCOUNTER — Other Ambulatory Visit: Payer: Self-pay

## 2021-07-26 DIAGNOSIS — E118 Type 2 diabetes mellitus with unspecified complications: Secondary | ICD-10-CM

## 2021-07-26 MED ORDER — BLOOD GLUCOSE METER KIT: PACK | 0 refills | Status: AC

## 2021-07-26 MED ORDER — ACCU-CHEK SMARTVIEW VI STRP: ORAL_STRIP | 2 refills | Status: DC

## 2021-07-26 NOTE — Telephone Encounter (Signed)
Test strips resent to [facility].  KP

## 2021-07-26 NOTE — Progress Notes (Signed)
Called [facility] to see why they were not filling patients Test Strips and they said her device and test strips are no longer available so I sent in a new generic device with strips and lancets.

## 2021-07-30 ENCOUNTER — Encounter: Payer: Self-pay | Admitting: Family Medicine

## 2021-07-30 ENCOUNTER — Inpatient Hospital Stay: Payer: Self-pay | Admitting: Radiology

## 2021-07-30 ENCOUNTER — Ambulatory Visit: Payer: Medicare Other | Admitting: Family Medicine

## 2021-07-30 VITALS — BP 138/78 | HR 112 | Ht 59.0 in | Wt 139.0 lb

## 2021-07-30 DIAGNOSIS — M7541 Impingement syndrome of right shoulder: Secondary | ICD-10-CM | POA: Diagnosis not present

## 2021-07-30 DIAGNOSIS — M62838 Other muscle spasm: Secondary | ICD-10-CM

## 2021-07-30 MED ORDER — TRIAMCINOLONE ACETONIDE 40 MG/ML IJ SUSP
40.0000 mg | Freq: Once | INTRAMUSCULAR | Status: AC
  Administered 2021-07-30: 80 mg via INTRAMUSCULAR

## 2021-07-30 NOTE — Assessment & Plan Note (Signed)
See additional assessment(s) for plan details.  Advised patient discontinue any NSAIDs, we proceeded with trigger point injections to the right upper medial trapezius and rhomboid minor regions.

## 2021-07-30 NOTE — Assessment & Plan Note (Signed)
Patient presents for follow-up to right shoulder pain, pain has persisted without improvement and she is beginning to note pain at the posterior right shoulder as well.  Examination shows a stable rotator cuff findings, trigger points at the rhomboids and upper trapezius medially.  Given her response to NSAIDs, persistent symptomatology, we revisited treatment strategies and she did elect to proceed with ultrasound-guided right subacromial cortisone injection, additionally trigger point injections.  Post care was reviewed with the patient and her husband who was present, plan to restart home exercises towards the end of this week, and they can follow-up on as-needed basis.

## 2021-07-30 NOTE — Patient Instructions (Addendum)
You have just been given a cortisone injection to reduce pain and inflammation. After the injection you may notice immediate relief of pain as a result of the Lidocaine. It is important to rest the area of the injection for 24 to 48 hours after the injection. There is a possibility of some temporary increased discomfort and swelling for up to 72 hours until the cortisone begins to work. If you do have pain, simply rest the joint and use ice. If you can tolerate over the counter medications, you can try Tylenol for added relief per package instructions. - As above, take relative rest x2 days then gradual return to normal activity - Start home exercises towards the end of this week - Contact us for questions and follow-up as needed

## 2021-07-30 NOTE — Progress Notes (Signed)
Primary Care / Sports Medicine Office Visit  Patient Information:  Patient ID: Elizabeth Klein, female DOB: March 08, 1950 Age: 71 y.o. MRN: [id number]   Elizabeth Klein is a pleasant 71 y.o. female presenting with the following:  Chief Complaint  Patient presents with   Shoulder Pain    Right shoulder pain, would like injections today.     Vitals:   07/30/21 1448  BP: 138/78  Pulse: (!) 112  SpO2: 96%   Vitals:   07/30/21 1448  Weight: 139 lb (63 kg)  Height: '4\' 11"'$  (1.499 m)   Body mass index is 28.07 kg/m.  DG Shoulder Right  Result Date: 07/09/2021 CLINICAL DATA:  Right shoulder pain. EXAM: RIGHT SHOULDER - 2+ VIEW COMPARISON:  X-ray 09/09/2017. FINDINGS: No fracture or dislocation is seen. There are no focal lytic lesions. There are no abnormal soft tissue calcifications. Minimal bony spurs are noted in the inferior aspect of glenohumeral joint. No significant interval changes are noted. IMPRESSION: No fracture or dislocation is seen. Degenerative changes with minimal bony spurs are noted. Electronically Signed   By: Elmer Picker M.D.   On: 07/09/2021 19:25     Independent interpretation of notes and tests performed by another provider:   None  Procedures performed:   Procedure:  Injection of right subacromial space under ultrasound guidance. Ultrasound guidance utilized for in-plane approach, sonographic evidence of supraspinatus tendinopathy without discrete tear visualized Samsung HS60 device utilized with permanent recording / reporting. Verbal informed consent obtained and verified. Skin prepped in a sterile fashion. Ethyl chloride for topical local analgesia.  Completed without difficulty and tolerated well. Medication: triamcinolone acetonide 40 mg/mL suspension for injection 1 mL total and 2 mL lidocaine 1% without epinephrine utilized for needle placement anesthetic Advised to contact for fevers/chills, erythema, induration, drainage, or  persistent bleeding.  Procedure:  Trigger point injections of right upper medial trapezius and right rhomboid minor Verbal informed consent obtained and verified. Skin prepped in a sterile fashion. Ethyl chloride for topical local analgesia.  Completed without difficulty and tolerated well. Medication: triamcinolone acetonide 40 mg/mL suspension for injection 1 mL total and 2 mL lidocaine 1% without epinephrine utilized for needle placement anesthetic Advised to contact for fevers/chills, erythema, induration, drainage, or persistent bleeding.   Pertinent History, Exam, Impression, and Recommendations:   Problem List Items Addressed This Visit       Musculoskeletal and Integument   Impingement syndrome of shoulder, right - Primary    Patient presents for follow-up to right shoulder pain, pain has persisted without improvement and she is beginning to note pain at the posterior right shoulder as well.  Examination shows a stable rotator cuff findings, trigger points at the rhomboids and upper trapezius medially.  Given her response to NSAIDs, persistent symptomatology, we revisited treatment strategies and she did elect to proceed with ultrasound-guided right subacromial cortisone injection, additionally trigger point injections.  Post care was reviewed with the patient and her husband who was present, plan to restart home exercises towards the end of this week, and they can follow-up on as-needed basis.       Relevant Orders   [place] LIMITED JOINT SPACE STRUCTURES UP RIGHT     Other   Cervical paraspinal muscle spasm    See additional assessment(s) for plan details.  Advised patient discontinue any NSAIDs, we proceeded with trigger point injections to the right upper medial trapezius and rhomboid minor regions.         Orders & Medications Meds  ordered this encounter  Medications   triamcinolone acetonide (KENALOG-40) injection 40 mg   Orders Placed This Encounter  Procedures   [place]  LIMITED JOINT SPACE STRUCTURES UP RIGHT     Return if symptoms worsen or fail to improve.     Montel Culver, MD   Primary Care Sports Medicine [place]

## 2021-08-13 NOTE — Unmapped (Unsigned)
Patient is unsure if she wants to continue on Dupixent or not. Will speak with son and then call us back.     Mirian Capuchin, PharmD  [facility] Specialty Pharmacist

## 2021-08-15 ENCOUNTER — Encounter: Payer: Self-pay | Admitting: Internal Medicine

## 2021-08-15 ENCOUNTER — Ambulatory Visit (INDEPENDENT_AMBULATORY_CARE_PROVIDER_SITE_OTHER): Payer: Medicare Other | Admitting: Internal Medicine

## 2021-08-15 VITALS — BP 134/84 | HR 96 | Ht 59.0 in | Wt 135.0 lb

## 2021-08-15 DIAGNOSIS — G8929 Other chronic pain: Secondary | ICD-10-CM | POA: Diagnosis not present

## 2021-08-15 DIAGNOSIS — M5432 Sciatica, left side: Secondary | ICD-10-CM

## 2021-08-15 DIAGNOSIS — E118 Type 2 diabetes mellitus with unspecified complications: Secondary | ICD-10-CM | POA: Diagnosis not present

## 2021-08-15 DIAGNOSIS — L409 Psoriasis, unspecified: Secondary | ICD-10-CM | POA: Diagnosis not present

## 2021-08-15 DIAGNOSIS — I10 Essential (primary) hypertension: Secondary | ICD-10-CM

## 2021-08-15 DIAGNOSIS — M25511 Pain in right shoulder: Secondary | ICD-10-CM | POA: Diagnosis not present

## 2021-08-15 MED ORDER — GABAPENTIN 100 MG PO CAPS: 100.0000 mg | ORAL_CAPSULE | Freq: Three times a day (TID) | ORAL | 0 refills | Status: DC

## 2021-08-15 NOTE — Progress Notes (Signed)
Date:  08/15/2021   Name:  Elizabeth Klein   DOB:  16-Jun-1950   MRN:  [id number]   Chief Complaint: Hip Pain (Tingling and numbness pain, Left Hip into Leg.) and Hypoglycemia (BS has been low for the last week. Patient is tired and not feeling great. /)  Shoulder Pain  The pain is present in the right shoulder. This is a chronic problem. The problem has been gradually improving. The quality of the pain is described as aching. The pain is mild. Associated symptoms include a limited range of motion, numbness and tingling. Pertinent negatives include no fever. Treatments tried: steroid injection 2 weeks ago has helped. lumbar radiculopathy s/p ESI x 4 several years ago  Extremity Weakness  The pain is present in the left lower leg. This is a recurrent problem. The current episode started in the past 7 days. There has been no history of extremity trauma. The problem occurs constantly. The problem has been unchanged. The quality of the pain is described as burning (and numbness with weakness). The patient is experiencing no pain. Associated symptoms include a limited range of motion, numbness and tingling. Pertinent negatives include no fever. The symptoms are aggravated by standing (and walking). She has tried nothing for the symptoms. lumbar radiculopathy s/p ESI x 4 several years ago  Diabetes She presents for her follow-up diabetic visit. She has type 2 diabetes mellitus. Her disease course has been fluctuating (BS went over 300 after steroid injection then rapidly decreased and now in the 80's.). Pertinent negatives for hypoglycemia include no nervousness/anxiousness. Associated symptoms include fatigue (and just does not feel well) and weakness. Pertinent negatives for diabetes include no chest pain. Symptoms are improving.  Rash This is a chronic problem. The affected locations include the scalp. The rash is characterized by blistering and draining. Associated symptoms include fatigue (and just  does not feel well). Pertinent negatives include no fever or shortness of breath. Treatments tried: started on Dupixent from [facility] with good response but unsure if she should continue.    Lab Results  Component Value Date   NA 137 04/20/2021   K 4.7 04/20/2021   CO2 22 04/20/2021   GLUCOSE 141 (H) 04/20/2021   BUN 28 (H) 04/20/2021   CREATININE 0.94 04/20/2021   CALCIUM 8.9 04/20/2021   EGFR 52 (L) 08/23/2020   GFRNONAA >60 04/20/2021   Lab Results  Component Value Date   CHOL 110 08/23/2020   HDL 40 08/23/2020   LDLCALC 47 08/23/2020   TRIG 128 08/23/2020   CHOLHDL 2.8 08/23/2020   Lab Results  Component Value Date   TSH 1.690 08/23/2020   Lab Results  Component Value Date   HGBA1C 7.0 07/06/2021   Lab Results  Component Value Date   WBC 14.1 (H) 04/20/2021   HGB 11.9 (L) 04/20/2021   HCT 38.1 04/20/2021   MCV 90.3 04/20/2021   PLT 442 (H) 04/20/2021   Lab Results  Component Value Date   ALT 18 04/20/2021   AST 21 04/20/2021   ALKPHOS 69 04/20/2021   BILITOT 0.5 04/20/2021   Lab Results  Component Value Date   VD25OH 32.6 06/02/2017     Review of Systems  Constitutional:  Positive for fatigue (and just does not feel well). Negative for chills, diaphoresis and fever.  Respiratory:  Negative for chest tightness and shortness of breath.   Cardiovascular:  Negative for chest pain and leg swelling.  Musculoskeletal:  Positive for extremity weakness and gait problem. Negative  for back pain.  Skin:  Positive for rash.  Neurological:  Positive for tingling, weakness and numbness.  Psychiatric/Behavioral:  Negative for dysphoric mood and sleep disturbance. The patient is not nervous/anxious.     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   Microalbuminuria 04/30/2017   CKD (chronic kidney disease) stage 3, GFR 30-59 ml/min (HCC) 04/30/2017   Type II diabetes mellitus with complication ([place]) 05/39/7673   Essential hypertension 03/19/2017   CAD (coronary artery disease) 03/19/2017   History of kidney stones 03/19/2017   Psoriasis 03/19/2017   Hyperlipidemia associated with type 2 diabetes mellitus ([place]) 03/19/2017   Nocturnal leg cramps 03/19/2017   Gait instability 03/19/2017   Insomnia 03/19/2017   Allergic rhinitis 03/19/2017    Allergies  Allergen Reactions   Prednisone Other (See Comments)    Intolerance  - confusion     Past Surgical History:  Procedure Laterality Date   BREAST BIOPSY Right    neg   CATARACT EXTRACTION W/PHACO Left 09/27/2019   Procedure: CATARACT EXTRACTION PHACO AND INTRAOCULAR LENS PLACEMENT ([place]) LEFT DIABETIC ISTENT INJ INTRAVITREAL KENALOG INJ;  Surgeon: Eulogio Bear, MD;  Location: [place];  Service: Ophthalmology;  Laterality: Left;  2.83 0:28.7   CATARACT EXTRACTION W/PHACO Right 12/13/2019   Procedure: CATARACT EXTRACTION PHACO AND INTRAOCULAR LENS PLACEMENT ([place]) RIGHT ISTENT INJ KENALOG INJ DIABETIC;  Surgeon: Eulogio Bear, MD;  Location: [place];  Service: Ophthalmology;  Laterality: Right;  1.39 0:21.5   CESAREAN SECTION      Social History   Tobacco Use   Smoking status: Never   Smokeless tobacco: Never  Vaping Use   Vaping Use: Never used  Substance Use Topics   Alcohol use: Never   Drug use: Never     Medication list has been reviewed and updated.  Current Meds  Medication Sig   acetaminophen (TYLENOL) 500 MG tablet Take 1,000 mg by mouth every 8 (eight) hours as needed.   albuterol (VENTOLIN HFA) 108 (90 Base) MCG/ACT inhaler Inhale 2 puffs into the lungs every 6 (six) hours as needed for wheezing or shortness of breath.   Ascorbic Acid (VITAMIN C PO) Take by mouth.   aspirin EC 81 MG  tablet Take 1 tablet (81 mg total) by mouth daily.   atorvastatin (LIPITOR) 80 MG tablet TAKE 1 TABLET BY MOUTH  DAILY   BD INSULIN SYRINGE U/F 30G X 1/2" 0.5 ML MISC USE WITH LANTUS INJECTIONS  FOR TYPE 2 DIABETES  MELLITUS   blood glucose meter kit and supplies Dispense based on patient and insurance preference. Use up to four times daily as directed. (FOR ICD-10 E10.9, E11.9).   clobetasol (TEMOVATE) 0.05 % external solution Apply topically 2 (two) times daily. To scalp lesions   clobetasol ointment (TEMOVATE) 4.19 % Apply 1 application topically 2 (two) times daily.   DUPIXENT 300 MG/2ML SOPN Inject into the skin every 14 (fourteen) days.   glimepiride (AMARYL) 2 MG tablet TAKE 1 TABLET BY MOUTH DAILY  WITH BREAKFAST   hydrocortisone 2.5 % ointment Apply topically 2 (two) times daily. To lesion on arm and buttocks   insulin glargine (LANTUS) 100 UNIT/ML injection Inject 0.4 mLs (40 Units total) into the skin every morning. Pt taking QAM (  Patient taking differently: Inject 27 Units into the skin every morning. Pt taking QAM)   lidocaine (XYLOCAINE) 5 % ointment Apply topically.   losartan (COZAAR) 25 MG tablet Take 1 tablet (25 mg total) by mouth daily.   losartan (COZAAR) 50 MG tablet TAKE 1 TABLET BY MOUTH  DAILY   meloxicam (MOBIC) 7.5 MG tablet Take 1 tablet (7.5 mg total) by mouth daily.   metFORMIN (GLUCOPHAGE) 1000 MG tablet Take 1 tablet (1,000 mg total) by mouth 2 (two) times daily with a meal.   metoprolol tartrate (LOPRESSOR) 50 MG tablet TAKE 1 TABLET BY MOUTH TWICE  DAILY   montelukast (SINGULAIR) 10 MG tablet TAKE 1 TABLET BY MOUTH DAILY   nystatin (MYCOSTATIN) 100000 UNIT/ML suspension 1 teaspoon  swish, gargle and spit four times per day.   Omega-3 Fatty Acids (FISH OIL) 1000 MG CAPS Take by mouth.   omeprazole (PRILOSEC) 20 MG capsule TAKE 1 CAPSULE BY MOUTH  DAILY   ONETOUCH DELICA PLUS LANCETS MISC 1 each by Does not apply route 2 (two) times daily.   Semaglutide, 1  MG/DOSE, (OZEMPIC, 1 MG/DOSE,) 4 MG/3ML SOPN Inject into the skin. Inject 1 mg into the skin every 7 days   traZODone (DESYREL) 50 MG tablet TAKE 1 TABLET BY MOUTH AT  BEDTIME   vitamin B-12 (CYANOCOBALAMIN) 1000 MCG tablet Take 1,000 mcg by mouth daily.       08/15/2021    3:43 PM 07/30/2021    2:50 PM 07/24/2021   10:23 AM 07/09/2021    2:23 PM  GAD 7 : Generalized Anxiety Score  Nervous, Anxious, on Edge 1 1 1 2   Control/stop worrying 1 1 1 1   Worry too much - different things 0 2 2 2   Trouble relaxing 0 2 2 1   Restless 2 1 1 1   Easily annoyed or irritable 0 1 1 2   Afraid - awful might happen 0 0 0 1  Total GAD 7 Score 4 8 8 10   Anxiety Difficulty Very difficult  Somewhat difficult Somewhat difficult       08/15/2021    3:43 PM  Depression screen PHQ 2/9  Decreased Interest 2  Down, Depressed, Hopeless 1  PHQ - 2 Score 3  Altered sleeping 2  Tired, decreased energy 2  Change in appetite 2  Feeling bad or failure about yourself  2  Trouble concentrating 2  Moving slowly or fidgety/restless 2  Suicidal thoughts 0  PHQ-9 Score 15  Difficult doing work/chores Very difficult    BP Readings from Last 3 Encounters:  08/15/21 134/84  07/30/21 138/78  07/24/21 118/78    Physical Exam Vitals and nursing note reviewed.  Constitutional:      General: She is not in acute distress.    Appearance: Normal appearance. She is well-developed.  HENT:     Head: Normocephalic and atraumatic.  Cardiovascular:     Rate and Rhythm: Normal rate and regular rhythm.  Pulmonary:     Effort: Pulmonary effort is normal. No respiratory distress.     Breath sounds: No wheezing or rhonchi.  Musculoskeletal:     Cervical back: Normal range of motion.     Lumbar back: No bony tenderness. Decreased range of motion. Positive left straight leg raise test. Negative right straight leg raise test.     Left hip: Bony tenderness present. Decreased range of motion.     Right lower leg: No edema.      Left lower leg: No edema.  Lymphadenopathy:  Cervical: No cervical adenopathy.  Skin:    General: Skin is warm and dry.     Findings: No rash.  Neurological:     Mental Status: She is alert and oriented to person, place, and time.     Motor: No weakness or tremor.     Deep Tendon Reflexes:     Reflex Scores:      Patellar reflexes are 1+ on the right side and 1+ on the left side. Psychiatric:        Mood and Affect: Mood normal.        Behavior: Behavior normal.     Wt Readings from Last 3 Encounters:  08/15/21 135 lb (61.2 kg)  07/30/21 139 lb (63 kg)  07/24/21 137 lb 9.6 oz (62.4 kg)    BP 134/84   Pulse 96   Ht 4' 11"  (1.499 m)   Wt 135 lb (61.2 kg)   SpO2 97%   BMI 27.27 kg/m   Assessment and Plan: 1. Chronic right shoulder pain Improved after steroid injection BS were very high - if she needs another injection would recommend a different steroid.  2. Type II diabetes mellitus with complication (HCC) Improving - continue diet changes and current medications Due for follow up in several weeks  3. Essential hypertension Clinically stable exam with well controlled BP. Tolerating medications without side effects at this time. Pt to continue current regimen and low sodium diet; benefits of regular exercise as able discussed.  4. Sciatica of left side without back pain Suspect recurrence of lumbar disc disease with left leg symptoms  Begin gabapentin tid - re-evaluate at next visit Return sooner if symptoms worsen - gabapentin (NEURONTIN) 100 MG capsule; Take 1 capsule (100 mg total) by mouth 3 (three) times daily.  Dispense: 90 capsule; Refill: 0  5. Psoriasis Improved with a short course of Dupixent Recommend that she continue this treatment and follow up with Dermatology   Partially dictated using [place]. Any errors are unintentional.  Halina Maidens, MD [facility]  08/15/2021

## 2021-08-20 MED FILL — DUPIXENT 300 MG/2 ML SUBCUTANEOUS PEN INJECTOR: SUBCUTANEOUS | 28 days supply | Qty: 4 | Fill #2

## 2021-08-27 ENCOUNTER — Encounter: Payer: Medicare Other | Admitting: Internal Medicine

## 2021-08-30 ENCOUNTER — Encounter: Payer: Self-pay | Admitting: Internal Medicine

## 2021-08-30 ENCOUNTER — Ambulatory Visit (INDEPENDENT_AMBULATORY_CARE_PROVIDER_SITE_OTHER): Payer: Medicare Other | Admitting: Internal Medicine

## 2021-08-30 VITALS — BP 120/80 | HR 85 | Ht 59.0 in | Wt 137.0 lb

## 2021-08-30 DIAGNOSIS — I10 Essential (primary) hypertension: Secondary | ICD-10-CM | POA: Diagnosis not present

## 2021-08-30 DIAGNOSIS — E118 Type 2 diabetes mellitus with unspecified complications: Secondary | ICD-10-CM

## 2021-08-30 DIAGNOSIS — M8580 Other specified disorders of bone density and structure, unspecified site: Secondary | ICD-10-CM

## 2021-08-30 DIAGNOSIS — E785 Hyperlipidemia, unspecified: Secondary | ICD-10-CM

## 2021-08-30 DIAGNOSIS — Z780 Asymptomatic menopausal state: Secondary | ICD-10-CM | POA: Diagnosis not present

## 2021-08-30 DIAGNOSIS — E1169 Type 2 diabetes mellitus with other specified complication: Secondary | ICD-10-CM | POA: Diagnosis not present

## 2021-08-30 DIAGNOSIS — Z0000 Encounter for general adult medical examination without abnormal findings: Secondary | ICD-10-CM

## 2021-08-30 DIAGNOSIS — Z1231 Encounter for screening mammogram for malignant neoplasm of breast: Secondary | ICD-10-CM | POA: Diagnosis not present

## 2021-08-30 NOTE — Progress Notes (Signed)
Date:  08/30/2021   Name:  Elizabeth Klein   DOB:  1951/01/03   MRN:  [id number]   Chief Complaint: Annual Exam (Breast Exam. No pap.) Elizabeth Klein is a 71 y.o. female who presents today for her Complete Annual Exam. She feels fairly well. She reports walking. She reports she is sleeping poorly. Breast complaints - none.  Mammogram: 09/2020 DEXA: 10/2020 osteopenia Pap smear: discontinued Colonoscopy: 09/2015 repeat 10 yrs  Health Maintenance Due  Topic Date Due   TETANUS/TDAP  Never done   Zoster Vaccines- Shingrix (2 of 2) 06/25/2017    Immunization History  Administered Date(s) Administered   Influenza, High Dose Seasonal PF 01/07/2018, 12/02/2018   Influenza-Unspecified 10/26/2016, 11/25/2020   PFIZER(Purple Top)SARS-COV-2 Vaccination 04/21/2019, 05/12/2019, 11/23/2019, 06/16/2020   Pneumococcal Conjugate-13 03/19/2017   Pneumococcal Polysaccharide-23 08/20/2018   Zoster Recombinat (Shingrix) 04/30/2017    Hypertension This is a chronic problem. The problem is controlled. Pertinent negatives include no chest pain, headaches, palpitations or shortness of breath. Past treatments include beta blockers and angiotensin blockers.  Hyperlipidemia This is a chronic problem. The problem is controlled. Pertinent negatives include no chest pain or shortness of breath. Current antihyperlipidemic treatment includes statins.  Gastroesophageal Reflux She complains of heartburn. She reports no abdominal pain, no chest pain, no coughing or no wheezing. This is a recurrent problem. The problem occurs occasionally. Pertinent negatives include no fatigue. She has tried a PPI for the symptoms.  Diabetes Diabetes visit type: followed by Endo. She has type 2 diabetes mellitus. Her disease course has been stable. Pertinent negatives for hypoglycemia include no dizziness, headaches, nervousness/anxiousness or tremors. Pertinent negatives for diabetes include no chest pain, no fatigue, no  polydipsia and no polyuria.    Lab Results  Component Value Date   NA 137 04/20/2021   K 4.7 04/20/2021   CO2 22 04/20/2021   GLUCOSE 141 (H) 04/20/2021   BUN 28 (H) 04/20/2021   CREATININE 0.94 04/20/2021   CALCIUM 8.9 04/20/2021   EGFR 52 (L) 08/23/2020   GFRNONAA >60 04/20/2021   Lab Results  Component Value Date   CHOL 110 08/23/2020   HDL 40 08/23/2020   LDLCALC 47 08/23/2020   TRIG 128 08/23/2020   CHOLHDL 2.8 08/23/2020   Lab Results  Component Value Date   TSH 1.690 08/23/2020   Lab Results  Component Value Date   HGBA1C 7.0 07/06/2021   Lab Results  Component Value Date   WBC 14.1 (H) 04/20/2021   HGB 11.9 (L) 04/20/2021   HCT 38.1 04/20/2021   MCV 90.3 04/20/2021   PLT 442 (H) 04/20/2021   Lab Results  Component Value Date   ALT 18 04/20/2021   AST 21 04/20/2021   ALKPHOS 69 04/20/2021   BILITOT 0.5 04/20/2021   Lab Results  Component Value Date   VD25OH 32.6 06/02/2017     Review of Systems  Constitutional:  Negative for chills, fatigue and fever.  HENT:  Negative for congestion, hearing loss, tinnitus, trouble swallowing and voice change.   Eyes:  Negative for visual disturbance.  Respiratory:  Negative for cough, chest tightness, shortness of breath and wheezing.   Cardiovascular:  Negative for chest pain, palpitations and leg swelling.  Gastrointestinal:  Positive for heartburn. Negative for abdominal pain, constipation, diarrhea and vomiting.  Endocrine: Negative for polydipsia and polyuria.  Genitourinary:  Negative for dysuria, frequency, genital sores, vaginal bleeding and vaginal discharge.  Musculoskeletal:  Negative for arthralgias, gait problem and joint swelling.  Skin:  Negative for color change and rash.  Neurological:  Negative for dizziness, tremors, light-headedness and headaches.  Hematological:  Negative for adenopathy. Does not bruise/bleed easily.  Psychiatric/Behavioral:  Negative for dysphoric mood and sleep  disturbance. The patient is not nervous/anxious.     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   Microalbuminuria 04/30/2017   CKD (chronic kidney disease) stage 3, GFR 30-59 ml/min (HCC) 04/30/2017   Type II diabetes mellitus with complication ([place]) 57/84/6962   Essential hypertension 03/19/2017   CAD (coronary artery disease) 03/19/2017   History of kidney stones 03/19/2017   Psoriasis 03/19/2017   Hyperlipidemia associated with type 2 diabetes mellitus ([place]) 03/19/2017   Nocturnal leg cramps 03/19/2017   Gait instability 03/19/2017   Insomnia 03/19/2017   Allergic rhinitis 03/19/2017    Allergies  Allergen Reactions   Prednisone Other (See Comments)    Intolerance  - confusion     Past Surgical History:  Procedure Laterality Date   BREAST BIOPSY Right    neg   CATARACT EXTRACTION W/PHACO Left 09/27/2019   Procedure: CATARACT EXTRACTION PHACO AND INTRAOCULAR LENS PLACEMENT ([place]) LEFT DIABETIC ISTENT INJ INTRAVITREAL KENALOG INJ;  Surgeon: Eulogio Bear, MD;  Location: [place];  Service: Ophthalmology;  Laterality: Left;  2.83 0:28.7   CATARACT EXTRACTION W/PHACO Right 12/13/2019   Procedure: CATARACT EXTRACTION PHACO AND INTRAOCULAR LENS PLACEMENT ([place]) RIGHT ISTENT INJ KENALOG INJ DIABETIC;  Surgeon: Eulogio Bear, MD;  Location: [place];  Service: Ophthalmology;  Laterality: Right;  1.39 0:21.5   CESAREAN SECTION      Social History   Tobacco Use   Smoking status: Never   Smokeless tobacco: Never  Vaping Use   Vaping Use: Never used  Substance Use Topics   Alcohol use: Never   Drug use: Never     Medication list has been reviewed and updated.  Current Meds   Medication Sig   acetaminophen (TYLENOL) 500 MG tablet Take 1,000 mg by mouth every 8 (eight) hours as needed.   albuterol (VENTOLIN HFA) 108 (90 Base) MCG/ACT inhaler Inhale 2 puffs into the lungs every 6 (six) hours as needed for wheezing or shortness of breath.   Ascorbic Acid (VITAMIN C PO) Take by mouth.   aspirin EC 81 MG tablet Take 1 tablet (81 mg total) by mouth daily.   atorvastatin (LIPITOR) 80 MG tablet TAKE 1 TABLET BY MOUTH  DAILY   BD INSULIN SYRINGE U/F 30G X 1/2" 0.5 ML MISC USE WITH LANTUS INJECTIONS  FOR TYPE 2 DIABETES  MELLITUS   blood glucose meter kit and supplies Dispense based on patient and insurance preference. Use up to four times daily as directed. (FOR ICD-10 E10.9, E11.9).   clobetasol (TEMOVATE) 0.05 % external solution Apply topically 2 (two) times daily. To scalp lesions   clobetasol ointment (TEMOVATE) 9.52 % Apply 1 application topically 2 (two) times daily.   DUPIXENT 300 MG/2ML SOPN Inject into the skin every 14 (fourteen) days.   gabapentin (NEURONTIN) 100 MG capsule Take 1 capsule (100 mg total) by mouth 3 (three) times daily.   glimepiride (AMARYL) 2 MG tablet TAKE 1 TABLET BY MOUTH DAILY  WITH BREAKFAST   hydrocortisone 2.5 % ointment Apply topically 2 (two) times daily. To  lesion on arm and buttocks   insulin glargine (LANTUS) 100 UNIT/ML injection Inject 0.4 mLs (40 Units total) into the skin every morning. Pt taking QAM (Patient taking differently: Inject 27 Units into the skin every morning. Pt taking QAM)   lidocaine (XYLOCAINE) 5 % ointment Apply topically.   losartan (COZAAR) 50 MG tablet TAKE 1 TABLET BY MOUTH  DAILY   meloxicam (MOBIC) 7.5 MG tablet Take 1 tablet (7.5 mg total) by mouth daily.   metFORMIN (GLUCOPHAGE) 1000 MG tablet Take 1 tablet (1,000 mg total) by mouth 2 (two) times daily with a meal.   metoprolol tartrate (LOPRESSOR) 50 MG tablet TAKE 1 TABLET BY MOUTH TWICE  DAILY   montelukast (SINGULAIR) 10 MG tablet TAKE 1 TABLET BY  MOUTH DAILY   nystatin (MYCOSTATIN) 100000 UNIT/ML suspension 1 teaspoon  swish, gargle and spit four times per day.   Omega-3 Fatty Acids (FISH OIL) 1000 MG CAPS Take by mouth.   omeprazole (PRILOSEC) 20 MG capsule TAKE 1 CAPSULE BY MOUTH  DAILY   ONETOUCH DELICA PLUS LANCETS MISC 1 each by Does not apply route 2 (two) times daily.   Semaglutide, 1 MG/DOSE, (OZEMPIC, 1 MG/DOSE,) 4 MG/3ML SOPN Inject into the skin. Inject 1 mg into the skin every 7 days   traZODone (DESYREL) 50 MG tablet TAKE 1 TABLET BY MOUTH AT  BEDTIME   vitamin B-12 (CYANOCOBALAMIN) 1000 MCG tablet Take 1,000 mcg by mouth daily.       08/30/2021    8:55 AM 08/15/2021    3:43 PM 07/30/2021    2:50 PM 07/24/2021   10:23 AM  GAD 7 : Generalized Anxiety Score  Nervous, Anxious, on Edge 0 _0 Control/stop worrying 0 _1 Worry too much - different things 0 0 2 2  Trouble relaxing 0 0 2 2  Restless 0 _2 Easily annoyed or irritable 0 0 1 1  Afraid - awful might happen 0 0 0 0  Total GAD 7 Score 0 _3 Anxiety Difficulty Not difficult at all Very difficult  Somewhat difficult       08/30/2021    8:54 AM 08/15/2021    3:43 PM 07/30/2021    2:49 PM  Depression screen PHQ 2/9  Decreased Interest _4 Down, Depressed, Hopeless _5 PHQ - 2 Score _6 Altered sleeping 0 2 1  Tired, decreased energy _7 Change in appetite _8 Feeling bad or failure about yourself  0 2 1  Trouble concentrating _9 Moving slowly or fidgety/restless _10 Suicidal thoughts 0 0 0  PHQ-9 Score _11 Difficult doing work/chores Somewhat difficult Very difficult Not difficult at all    BP Readings from Last 3 Encounters:  08/30/21 120/80  08/15/21 134/84  07/30/21 138/78    Physical Exam Vitals and nursing note reviewed.  Constitutional:      General: She is not in acute distress.    Appearance: She is well-developed.  HENT:     Head: Normocephalic and atraumatic.     Right Ear: Tympanic membrane and ear  canal normal.     Left Ear: Tympanic membrane and ear canal normal.     Nose:     Right Sinus: No maxillary sinus tenderness.     Left Sinus: No maxillary sinus tenderness.  Eyes:     General: No  scleral icterus.       Right eye: No discharge.        Left eye: No discharge.     Conjunctiva/sclera: Conjunctivae normal.  Neck:     Thyroid: No thyromegaly.     Vascular: No carotid bruit.  Cardiovascular:     Rate and Rhythm: Normal rate and regular rhythm.     Pulses: Normal pulses.     Heart sounds: Normal heart sounds.  Pulmonary:     Effort: Pulmonary effort is normal. No respiratory distress.     Breath sounds: No wheezing.  Chest:  Breasts:    Right: No mass, nipple discharge, skin change or tenderness.     Left: No mass, nipple discharge, skin change or tenderness.  Abdominal:     General: Bowel sounds are normal.     Palpations: Abdomen is soft.     Tenderness: There is no abdominal tenderness.  Musculoskeletal:     Cervical back: Normal range of motion. No erythema.     Right lower leg: No edema.     Left lower leg: No edema.  Lymphadenopathy:     Cervical: No cervical adenopathy.  Skin:    General: Skin is warm and dry.     Findings: No rash.  Neurological:     Mental Status: She is alert and oriented to person, place, and time.     Cranial Nerves: No cranial nerve deficit.     Sensory: No sensory deficit.     Deep Tendon Reflexes: Reflexes are normal and symmetric.  Psychiatric:        Attention and Perception: Attention normal.        Mood and Affect: Mood normal.     Wt Readings from Last 3 Encounters:  08/30/21 137 lb (62.1 kg)  08/15/21 135 lb (61.2 kg)  07/30/21 139 lb (63 kg)    BP 120/80   Pulse 85   Ht _0  (1.499 m)   Wt 137 lb (62.1 kg)   SpO2 94%   BMI 27.67 kg/m   Assessment and Plan: 1. Annual physical exam Normal exam except for weight Continue healthy diet and exercise  2. Encounter for screening mammogram for breast  cancer Schedule at [place]  3. Essential hypertension Clinically stable exam with well controlled BP. Tolerating medications without side effects at this time. Pt to continue current regimen and low sodium diet; benefits of regular exercise as able discussed. - CBC with Differential/Platelet - TSH  4. Type II diabetes mellitus with complication (HCC) Stable, followed by Endo - Comprehensive metabolic panel - Microalbumin / creatinine urine ratio  5. Hyperlipidemia associated with type 2 diabetes mellitus ([place]) Tolerating statin medication without side effects at this time LDL is at goal of < 70 on current dose Continue same therapy without change at this time. - Lipid panel  6. Osteopenia after menopause Stopped calcium but continues on vitamin D - VITAMIN D 25 Hydroxy (Vit-D Deficiency, Fractures)   Partially dictated using Editor, [place]. Any errors are unintentional.  Halina Maidens, MD [facility]  08/30/2021

## 2021-08-31 LAB — CBC WITH DIFFERENTIAL/PLATELET
Basophils Absolute: 0.1 10*3/uL (ref 0.0–0.2)
Basos: 0 %
EOS (ABSOLUTE): 0.1 10*3/uL (ref 0.0–0.4)
Eos: 1 %
Hematocrit: 36.5 % (ref 34.0–46.6)
Hemoglobin: 11.8 g/dL (ref 11.1–15.9)
Immature Grans (Abs): 0 10*3/uL (ref 0.0–0.1)
Immature Granulocytes: 0 %
Lymphocytes Absolute: 2.7 10*3/uL (ref 0.7–3.1)
Lymphs: 22 %
MCH: 29.2 pg (ref 26.6–33.0)
MCHC: 32.3 g/dL (ref 31.5–35.7)
MCV: 90 fL (ref 79–97)
Monocytes Absolute: 0.8 10*3/uL (ref 0.1–0.9)
Monocytes: 6 %
Neutrophils Absolute: 8.8 10*3/uL — ABNORMAL HIGH (ref 1.4–7.0)
Neutrophils: 71 %
Platelets: 356 10*3/uL (ref 150–450)
RBC: 4.04 x10E6/uL (ref 3.77–5.28)
RDW: 13.4 % (ref 11.7–15.4)
WBC: 12.4 10*3/uL — ABNORMAL HIGH (ref 3.4–10.8)

## 2021-08-31 LAB — COMPREHENSIVE METABOLIC PANEL
ALT: 24 IU/L (ref 0–32)
AST: 18 IU/L (ref 0–40)
Albumin/Globulin Ratio: 1.5 (ref 1.2–2.2)
Albumin: 4.2 g/dL (ref 3.8–4.8)
Alkaline Phosphatase: 76 IU/L (ref 44–121)
BUN/Creatinine Ratio: 19 (ref 12–28)
BUN: 18 mg/dL (ref 8–27)
Bilirubin Total: 0.5 mg/dL (ref 0.0–1.2)
CO2: 21 mmol/L (ref 20–29)
Calcium: 9.7 mg/dL (ref 8.7–10.3)
Chloride: 104 mmol/L (ref 96–106)
Creatinine, Ser: 0.93 mg/dL (ref 0.57–1.00)
Globulin, Total: 2.8 g/dL (ref 1.5–4.5)
Glucose: 73 mg/dL (ref 70–99)
Potassium: 5.4 mmol/L — ABNORMAL HIGH (ref 3.5–5.2)
Sodium: 140 mmol/L (ref 134–144)
Total Protein: 7 g/dL (ref 6.0–8.5)
eGFR: 67 mL/min/{1.73_m2} (ref >59)

## 2021-08-31 LAB — LIPID PANEL
Chol/HDL Ratio: 2.5 ratio (ref 0.0–4.4)
Cholesterol, Total: 124 mg/dL (ref 100–199)
HDL: 50 mg/dL (ref >39)
LDL Chol Calc (NIH): 55 mg/dL (ref 0–99)
Triglycerides: 103 mg/dL (ref 0–149)
VLDL Cholesterol Cal: 19 mg/dL (ref 5–40)

## 2021-08-31 LAB — VITAMIN D 25 HYDROXY (VIT D DEFICIENCY, FRACTURES): Vit D, 25-Hydroxy: 38.6 ng/mL (ref 30.0–100.0)

## 2021-08-31 LAB — MICROALBUMIN / CREATININE URINE RATIO
Creatinine, Urine: 64.3 mg/dL
Microalb/Creat Ratio: 136 mg/g creat — ABNORMAL HIGH (ref 0–29)
Microalbumin, Urine: 87.4 ug/mL

## 2021-08-31 LAB — TSH: TSH: 1.36 u[IU]/mL (ref 0.450–4.500)

## 2021-09-05 NOTE — Unmapped (Signed)
[facility] Refill Coordination Note    Specialty Medication(s) to be Shipped:   Inflammatory Disorders: Dupixent    Other medication(s) to be shipped: No additional medications requested for fill at this time     Monique Coleman, DOB: 08/04/1950  Phone: [phone] (home)       All above HIPAA information was verified with patient.     Was a Nurse, learning disability used for this call? No    Completed refill call assessment today to schedule patient's medication shipment from the [facility] [phone]).  All relevant notes have been reviewed.     Specialty medication(s) and dose(s) confirmed: Regimen is correct and unchanged.   Changes to medications: Monique Coleman no changes at this time.  Changes to insurance: No  New side effects reported not previously addressed with a pharmacist or physician: None reported  Questions for the pharmacist: No    Confirmed patient received a Conservation officer, historic buildings and a Surveyor, mining with first shipment. The patient will receive a drug information handout for each medication shipped and additional FDA Medication Guides as required.       DISEASE/MEDICATION-SPECIFIC INFORMATION        For patients on injectable medications: Patient currently has 0 doses left.  Next injection is scheduled for 09/14/2021.    SPECIALTY MEDICATION ADHERENCE     Medication Adherence    Patient reported X missed doses in the last month: 0  Specialty Medication: Dupixent  Patient is on additional specialty medications: No  Any gaps in refill history greater than 2 weeks in the last 3 months: no  Demonstrates understanding of importance of adherence: yes  Informant: patient  Reliability of informant: reliable  Confirmed plan for next specialty medication refill: delivery by pharmacy  Refills needed for supportive medications: not needed              Were doses missed due to medication being on hold? No        REFERRAL TO PHARMACIST     Referral to the pharmacist: Not needed      [facility]     Shipping address confirmed in Epic.     Delivery Scheduled: Yes, Expected medication delivery date: 09/11/2021.     Medication will be delivered via Same Day Courier to the prescription address in Epic WAM.    Newell Wafer D Romana Deaton   [facility] Specialty Technician

## 2021-09-11 MED FILL — DUPIXENT 300 MG/2 ML SUBCUTANEOUS PEN INJECTOR: SUBCUTANEOUS | 28 days supply | Qty: 4 | Fill #3

## 2021-09-12 ENCOUNTER — Other Ambulatory Visit: Payer: Self-pay

## 2021-09-12 DIAGNOSIS — M5432 Sciatica, left side: Secondary | ICD-10-CM

## 2021-09-12 MED ORDER — GABAPENTIN 100 MG PO CAPS: 100.0000 mg | ORAL_CAPSULE | Freq: Three times a day (TID) | ORAL | 0 refills | Status: DC

## 2021-09-26 ENCOUNTER — Other Ambulatory Visit: Payer: Self-pay | Admitting: Internal Medicine

## 2021-09-26 DIAGNOSIS — E113212 Type 2 diabetes mellitus with mild nonproliferative diabetic retinopathy with macular edema, left eye: Secondary | ICD-10-CM | POA: Diagnosis not present

## 2021-09-27 NOTE — Telephone Encounter (Signed)
Requested Prescriptions  Pending Prescriptions Disp Refills  . BD INSULIN SYRINGE U/F 30G X 1/2" 0.5 ML MISC [Pharmacy Med Name: [id number] 98 each 3    Sig: USE WITH LANTUS INJECTIONS  FOR TYPE 2 DIABETES  MELLITUS     There is no refill protocol information for this order

## 2021-10-02 ENCOUNTER — Other Ambulatory Visit: Payer: Self-pay | Admitting: Internal Medicine

## 2021-10-02 DIAGNOSIS — M5432 Sciatica, left side: Secondary | ICD-10-CM

## 2021-10-02 NOTE — Telephone Encounter (Signed)
Requested Prescriptions  Pending Prescriptions Disp Refills  . gabapentin (NEURONTIN) 100 MG capsule [Pharmacy Med Name: Gabapentin 100 MG Oral Capsule] 90 capsule 11    Sig: TAKE 1 CAPSULE BY MOUTH 3 TIMES  DAILY     Neurology: Anticonvulsants - gabapentin Passed - 10/02/2021  4:24 AM      Passed - Cr in normal range and within 360 days    Creatinine, Ser  Date Value Ref Range Status  08/30/2021 0.93 0.57 - 1.00 mg/dL Final         Passed - Completed PHQ-2 or PHQ-9 in the last 360 days      Passed - Valid encounter within last 12 months    Recent Outpatient Visits          1 month ago Annual physical exam   [facility] Glean Hess, MD   1 month ago Chronic right shoulder pain   [facility] Glean Hess, MD   2 months ago Impingement syndrome of shoulder, right   [facility] Montel Culver, MD   2 months ago Impingement syndrome of shoulder, right   [facility] Montel Culver, MD   2 months ago Impingement syndrome of shoulder, right   [facility] Montel Culver, MD      Future Appointments            In 5 months Army Melia Jesse Sans, MD [facility], [place]   In 11 months Army Melia Jesse Sans, MD [facility], [facility]

## 2021-10-05 NOTE — Unmapped (Signed)
[facility] Refill Coordination Note    Specialty Medication(s) to be Shipped:   Inflammatory Disorders: Dupixent    Other medication(s) to be shipped: No additional medications requested for fill at this time     Monique Coleman, DOB: Oct 19, 1950  Phone: [phone] (home)       All above HIPAA information was verified with patient.     Was a Nurse, learning disability used for this call? No    Completed refill call assessment today to schedule patient's medication shipment from the [facility] [phone]).  All relevant notes have been reviewed.     Specialty medication(s) and dose(s) confirmed: Regimen is correct and unchanged.   Changes to medications: Monique Coleman reports no changes at this time.  Changes to insurance: No  New side effects reported not previously addressed with a pharmacist or physician: Yes - Patient reports vision changes, she has made an appointment with her optometrist. Patient would not like to speak to the pharmacist today. Their provider is not aware.  Questions for the pharmacist: No    Confirmed patient received a Conservation officer, historic buildings and a Surveyor, mining with first shipment. The patient will receive a drug information handout for each medication shipped and additional FDA Medication Guides as required.       DISEASE/MEDICATION-SPECIFIC INFORMATION        For patients on injectable medications: Patient currently has 0 doses left.  Next injection is scheduled for 10/12/21.    SPECIALTY MEDICATION ADHERENCE     Medication Adherence    Patient reported X missed doses in the last month: 0  Specialty Medication: Dupixent  Patient is on additional specialty medications: No  Any gaps in refill history greater than 2 weeks in the last 3 months: no  Demonstrates understanding of importance of adherence: yes  Informant: patient  Reliability of informant: reliable  Reasons for non-adherence: no problems identified                  Confirmed plan for next specialty medication refill: delivery by pharmacy  Refills needed for supportive medications: not needed              Were doses missed due to medication being on hold? No    DUPIXENT PEN 300 mg/2 mL Pnij (dupilumab)  : 0 days of medicine on hand        REFERRAL TO PHARMACIST     Referral to the pharmacist: Not needed      [facility]     Shipping address confirmed in Epic.     Delivery Scheduled: Yes, Expected medication delivery date: 10/10/21.     Medication will be delivered via UPS to the prescription address in Epic WAM.    Monique Coleman' W Monique Coleman Shared [facility] Specialty Technician

## 2021-10-07 ENCOUNTER — Other Ambulatory Visit: Payer: Self-pay | Admitting: Internal Medicine

## 2021-10-08 ENCOUNTER — Other Ambulatory Visit: Payer: Self-pay | Admitting: Internal Medicine

## 2021-10-08 DIAGNOSIS — E785 Hyperlipidemia, unspecified: Secondary | ICD-10-CM

## 2021-10-08 DIAGNOSIS — E118 Type 2 diabetes mellitus with unspecified complications: Secondary | ICD-10-CM

## 2021-10-08 NOTE — Telephone Encounter (Signed)
Requested Prescriptions  Pending Prescriptions Disp Refills  . ACCU-CHEK GUIDE test strip [Pharmacy Med Name: Accu-Chek Guide In Vitro Strip] 300 strip 2    Sig: CHECK BLOOD SUGAR 3 TIMES DAILY     Endocrinology: Diabetes - Testing Supplies Passed - 10/07/2021  4:25 AM      Passed - Valid encounter within last 12 months    Recent Outpatient Visits          1 month ago Annual physical exam   [facility] Glean Hess, MD   1 month ago Chronic right shoulder pain   [facility] Glean Hess, MD   2 months ago Impingement syndrome of shoulder, right   [facility] Montel Culver, MD   2 months ago Impingement syndrome of shoulder, right   [facility] Montel Culver, MD   3 months ago Impingement syndrome of shoulder, right   [facility] Montel Culver, MD      Future Appointments            In 4 months Army Melia Jesse Sans, MD [facility], [place]   In 11 months Army Melia Jesse Sans, MD [facility], [facility]

## 2021-10-09 MED FILL — DUPIXENT 300 MG/2 ML SUBCUTANEOUS PEN INJECTOR: SUBCUTANEOUS | 28 days supply | Qty: 4 | Fill #4

## 2021-10-09 NOTE — Telephone Encounter (Signed)
Requested medication (s) are due for refill today: yes  Requested medication (s) are on the active medication list: yes  Last refill:  05/24/21 #90/1  Future visit scheduled: yes  Notes to clinic:  K+ was high last time checked. Please assess if ok to refill    Requested Prescriptions  Pending Prescriptions Disp Refills   losartan (COZAAR) 50 MG tablet [Pharmacy Med Name: Losartan Potassium 50 MG Oral Tablet] 100 tablet 2    Sig: TAKE 1 TABLET BY MOUTH DAILY     Cardiovascular:  Angiotensin Receptor Blockers Failed - 10/08/2021  5:12 PM      Failed - K in normal range and within 180 days    Potassium  Date Value Ref Range Status  08/30/2021 5.4 (H) 3.5 - 5.2 mmol/L Final         Passed - Cr in normal range and within 180 days    Creatinine, Ser  Date Value Ref Range Status  08/30/2021 0.93 0.57 - 1.00 mg/dL Final         Passed - Patient is not pregnant      Passed - Last BP in normal range    BP Readings from Last 1 Encounters:  08/30/21 120/80         Passed - Valid encounter within last 6 months    Recent Outpatient Visits           1 month ago Annual physical exam   [facility] Glean Hess, MD   1 month ago Chronic right shoulder pain   [facility] Glean Hess, MD   2 months ago Impingement syndrome of shoulder, right   [facility] Montel Culver, MD   2 months ago Impingement syndrome of shoulder, right   [facility] Montel Culver, MD   3 months ago Impingement syndrome of shoulder, right   [facility] Montel Culver, MD       Future Appointments             In 4 months Army Melia Jesse Sans, MD [facility], [place]   In 10 months Glean Hess, MD [facility], [facility]            Signed Prescriptions Disp Refills   atorvastatin (LIPITOR) 80 MG tablet 100 tablet 3    Sig: TAKE 1 TABLET BY MOUTH ONCE  DAILY     Cardiovascular:  Antilipid - Statins Failed -  10/08/2021  5:12 PM      Failed - Lipid Panel in normal range within the last 12 months    Cholesterol, Total  Date Value Ref Range Status  08/30/2021 124 100 - 199 mg/dL Final   LDL Chol Calc (NIH)  Date Value Ref Range Status  08/30/2021 55 0 - 99 mg/dL Final   HDL  Date Value Ref Range Status  08/30/2021 50 >39 mg/dL Final   Triglycerides  Date Value Ref Range Status  08/30/2021 103 0 - 149 mg/dL Final         Passed - Patient is not pregnant      Passed - Valid encounter within last 12 months    Recent Outpatient Visits           1 month ago Annual physical exam   [facility] Glean Hess, MD   1 month ago Chronic right shoulder pain   [facility] Glean Hess, MD   2 months ago Impingement  syndrome of shoulder, right   [facility] Montel Culver, MD   2 months ago Impingement syndrome of shoulder, right   [facility] Montel Culver, MD   3 months ago Impingement syndrome of shoulder, right   [facility] Montel Culver, MD       Future Appointments             In 4 months Army Melia Jesse Sans, MD [facility], [place]   In 10 months Glean Hess, MD [facility], [facility]             metFORMIN (GLUCOPHAGE) 1000 MG tablet 200 tablet 1    Sig: TAKE 1 TABLET BY MOUTH TWICE  DAILY WITH MEALS     Endocrinology:  Diabetes - Biguanides Failed - 10/08/2021  5:12 PM      Failed - B12 Level in normal range and within 720 days    Vitamin B-12  Date Value Ref Range Status  03/19/2017 408 232 - 1,245 pg/mL Final         Passed - Cr in normal range and within 360 days    Creatinine, Ser  Date Value Ref Range Status  08/30/2021 0.93 0.57 - 1.00 mg/dL Final         Passed - HBA1C is between 0 and 7.9 and within 180 days    Hemoglobin A1C  Date Value Ref Range Status  07/06/2021 7.0  Final         Passed - eGFR in normal range and within 360 days    GFR calc Af Amer  Date Value  Ref Range Status  08/23/2019 >60 >60 mL/min Final   GFR, Estimated  Date Value Ref Range Status  04/20/2021 >60 >60 mL/min Final    Comment:    (NOTE) Calculated using the CKD-EPI Creatinine Equation (2021)    eGFR  Date Value Ref Range Status  08/30/2021 67 >59 mL/min/1.73 Final         Passed - Valid encounter within last 6 months    Recent Outpatient Visits           1 month ago Annual physical exam   [facility] Glean Hess, MD   1 month ago Chronic right shoulder pain   [facility] Glean Hess, MD   2 months ago Impingement syndrome of shoulder, right   [facility] Montel Culver, MD   2 months ago Impingement syndrome of shoulder, right   [facility] Montel Culver, MD   3 months ago Impingement syndrome of shoulder, right   [facility] Montel Culver, MD       Future Appointments             In 4 months Army Melia Jesse Sans, MD [facility], [place]   In 10 months Glean Hess, MD [facility] [place] - CBC within normal limits and completed in the last 12 months    WBC  Date Value Ref Range Status  08/30/2021 12.4 (H) 3.4 - 10.8 x10E3/uL Final  04/20/2021 14.1 (H) 4.0 - 10.5 K/uL Final   RBC  Date Value Ref Range Status  08/30/2021 4.04 3.77 - 5.28 x10E6/uL Final  04/20/2021 4.22 3.87 - 5.11 MIL/uL Final   Hemoglobin  Date Value Ref Range Status  08/30/2021 11.8 11.1 - 15.9 g/dL Final   Hematocrit  Date Value Ref Range Status  08/30/2021 36.5 34.0 - 46.6 % Final   MCHC  Date Value Ref Range Status  08/30/2021 32.3 31.5 - 35.7 g/dL Final  04/20/2021 31.2 30.0 - 36.0 g/dL Final   [facility]  Date Value Ref Range Status  08/30/2021 29.2 26.6 - 33.0 pg Final  04/20/2021 28.2 26.0 - 34.0 pg Final   MCV  Date Value Ref Range Status  08/30/2021 90 79 - 97 fL Final   No results found for: "PLTCOUNTKUC", "LABPLAT", "POCPLA" RDW  Date Value Ref  Range Status  08/30/2021 13.4 11.7 - 15.4 % Final

## 2021-10-09 NOTE — Telephone Encounter (Signed)
Requested Prescriptions  Pending Prescriptions Disp Refills  . atorvastatin (LIPITOR) 80 MG tablet [Pharmacy Med Name: Atorvastatin Calcium 80 MG Oral Tablet] 100 tablet 3    Sig: TAKE 1 TABLET BY MOUTH ONCE  DAILY     Cardiovascular:  Antilipid - Statins Failed - 10/08/2021  5:12 PM      Failed - Lipid Panel in normal range within the last 12 months    Cholesterol, Total  Date Value Ref Range Status  08/30/2021 124 100 - 199 mg/dL Final   LDL Chol Calc (NIH)  Date Value Ref Range Status  08/30/2021 55 0 - 99 mg/dL Final   HDL  Date Value Ref Range Status  08/30/2021 50 >39 mg/dL Final   Triglycerides  Date Value Ref Range Status  08/30/2021 103 0 - 149 mg/dL Final         Passed - Patient is not pregnant      Passed - Valid encounter within last 12 months    Recent Outpatient Visits          1 month ago Annual physical exam   [facility] Glean Hess, MD   1 month ago Chronic right shoulder pain   [facility] Glean Hess, MD   2 months ago Impingement syndrome of shoulder, right   [facility] Montel Culver, MD   2 months ago Impingement syndrome of shoulder, right   [facility] Montel Culver, MD   3 months ago Impingement syndrome of shoulder, right   [facility] Montel Culver, MD      Future Appointments            In 4 months Glean Hess, MD [facility], [place]   In 10 months Glean Hess, MD [facility], [place]           . metFORMIN (GLUCOPHAGE) 1000 MG tablet [Pharmacy Med Name: metFORMIN HCl 1000 MG Oral Tablet] 200 tablet 1    Sig: TAKE 1 TABLET BY MOUTH TWICE  DAILY WITH MEALS     Endocrinology:  Diabetes - Biguanides Failed - 10/08/2021  5:12 PM      Failed - B12 Level in normal range and within 720 days    Vitamin B-12  Date Value Ref Range Status  03/19/2017 408 232 - 1,245 pg/mL Final         Passed - Cr in normal range and within 360 days     Creatinine, Ser  Date Value Ref Range Status  08/30/2021 0.93 0.57 - 1.00 mg/dL Final         Passed - HBA1C is between 0 and 7.9 and within 180 days    Hemoglobin A1C  Date Value Ref Range Status  07/06/2021 7.0  Final         Passed - eGFR in normal range and within 360 days    GFR calc Af Amer  Date Value Ref Range Status  08/23/2019 >60 >60 mL/min Final   GFR, Estimated  Date Value Ref Range Status  04/20/2021 >60 >60 mL/min Final    Comment:    (NOTE) Calculated using the CKD-EPI Creatinine Equation (2021)    eGFR  Date Value Ref Range Status  08/30/2021 67 >59 mL/min/1.73 Final         Passed - Valid encounter within last 6 months    Recent Outpatient Visits          1 month ago Annual  physical exam   [facility] Glean Hess, MD   1 month ago Chronic right shoulder pain   [facility] Glean Hess, MD   2 months ago Impingement syndrome of shoulder, right   [facility] Montel Culver, MD   2 months ago Impingement syndrome of shoulder, right   [facility] Montel Culver, MD   3 months ago Impingement syndrome of shoulder, right   [facility] Montel Culver, MD      Future Appointments            In 4 months Army Melia Jesse Sans, MD [facility], [facility]   In 10 months Glean Hess, MD [facility], [facility]           Passed - CBC within normal limits and completed in the last 12 months    WBC  Date Value Ref Range Status  08/30/2021 12.4 (H) 3.4 - 10.8 x10E3/uL Final  04/20/2021 14.1 (H) 4.0 - 10.5 K/uL Final   RBC  Date Value Ref Range Status  08/30/2021 4.04 3.77 - 5.28 x10E6/uL Final  04/20/2021 4.22 3.87 - 5.11 MIL/uL Final   Hemoglobin  Date Value Ref Range Status  08/30/2021 11.8 11.1 - 15.9 g/dL Final   Hematocrit  Date Value Ref Range Status  08/30/2021 36.5 34.0 - 46.6 % Final   MCHC  Date Value Ref Range Status  08/30/2021 32.3 31.5 - 35.7 g/dL Final   04/20/2021 31.2 30.0 - 36.0 g/dL Final   [facility]  Date Value Ref Range Status  08/30/2021 29.2 26.6 - 33.0 pg Final  04/20/2021 28.2 26.0 - 34.0 pg Final   MCV  Date Value Ref Range Status  08/30/2021 90 79 - 97 fL Final   No results found for: "PLTCOUNTKUC", "LABPLAT", "POCPLA" RDW  Date Value Ref Range Status  08/30/2021 13.4 11.7 - 15.4 % Final         . losartan (COZAAR) 50 MG tablet [Pharmacy Med Name: Losartan Potassium 50 MG Oral Tablet] 100 tablet 2    Sig: TAKE 1 TABLET BY MOUTH DAILY     Cardiovascular:  Angiotensin Receptor Blockers Failed - 10/08/2021  5:12 PM      Failed - K in normal range and within 180 days    Potassium  Date Value Ref Range Status  08/30/2021 5.4 (H) 3.5 - 5.2 mmol/L Final         Passed - Cr in normal range and within 180 days    Creatinine, Ser  Date Value Ref Range Status  08/30/2021 0.93 0.57 - 1.00 mg/dL Final         Passed - Patient is not pregnant      Passed - Last BP in normal range    BP Readings from Last 1 Encounters:  08/30/21 120/80         Passed - Valid encounter within last 6 months    Recent Outpatient Visits          1 month ago Annual physical exam   [facility] Glean Hess, MD   1 month ago Chronic right shoulder pain   [facility] Glean Hess, MD   2 months ago Impingement syndrome of shoulder, right   [facility] Montel Culver, MD   2 months ago Impingement syndrome of shoulder, right   [facility] Montel Culver, MD   3 months ago Impingement syndrome of  shoulder, right   [facility] Montel Culver, MD      Future Appointments            In 4 months Army Melia Jesse Sans, MD [facility], [place]   In 10 months Army Melia Jesse Sans, MD [facility], [facility]

## 2021-10-12 DIAGNOSIS — E113212 Type 2 diabetes mellitus with mild nonproliferative diabetic retinopathy with macular edema, left eye: Secondary | ICD-10-CM | POA: Diagnosis not present

## 2021-10-12 LAB — HM DIABETES EYE EXAM

## 2021-10-16 NOTE — Progress Notes (Deleted)
Subjective:   Elizabeth Klein is a 71 y.o. female who presents for Medicare Annual (Subsequent) preventive examination.  I connected with  Elizabeth Klein on 10/16/21 by an in person visit and verified that I am speaking with the correct person using two identifiers.  Patient Location: Other:  Office/Clinic  Provider Location: Office/Clinic  I discussed the limitations of evaluation and management by telemedicine. The patient expressed understanding and agreed to proceed.   Review of Systems    Defer to PCP       Objective:    There were no vitals filed for this visit. There is no height or weight on file to calculate BMI.     04/20/2021    4:53 PM 04/20/2021    3:37 PM 10/16/2020   10:14 AM 12/13/2019    8:28 AM 10/13/2019   10:24 AM 09/27/2019    6:45 AM 08/06/2019   10:20 AM  Advanced Directives  Does Patient Have a Medical Advance Directive? _0  No No  Would patient like information on creating a medical advance directive? No - Patient declined  Yes (MAU/Ambulatory/Procedural Areas - Information given) No - Patient declined Yes (MAU/Ambulatory/Procedural Areas - Information given) No - Patient declined     Current Medications (verified) Outpatient Encounter Medications as of 10/17/2021  Medication Sig   ACCU-CHEK GUIDE test strip CHECK BLOOD SUGAR 3 TIMES DAILY   acetaminophen (TYLENOL) 500 MG tablet Take 1,000 mg by mouth every 8 (eight) hours as needed.   albuterol (VENTOLIN HFA) 108 (90 Base) MCG/ACT inhaler Inhale 2 puffs into the lungs every 6 (six) hours as needed for wheezing or shortness of breath.   Ascorbic Acid (VITAMIN C PO) Take by mouth.   aspirin EC 81 MG tablet Take 1 tablet (81 mg total) by mouth daily.   atorvastatin (LIPITOR) 80 MG tablet TAKE 1 TABLET BY MOUTH ONCE  DAILY   BD INSULIN SYRINGE U/F 30G X 1/2" 0.5 ML MISC USE WITH LANTUS INJECTIONS  FOR TYPE 2 DIABETES  MELLITUS   blood glucose meter kit and supplies Dispense based on  patient and insurance preference. Use up to four times daily as directed. (FOR ICD-10 E10.9, E11.9).   clobetasol (TEMOVATE) 0.05 % external solution Apply topically 2 (two) times daily. To scalp lesions   clobetasol ointment (TEMOVATE) 0.99 % Apply 1 application topically 2 (two) times daily.   DUPIXENT 300 MG/2ML SOPN Inject into the skin every 14 (fourteen) days.   gabapentin (NEURONTIN) 100 MG capsule TAKE 1 CAPSULE BY MOUTH 3 TIMES  DAILY   glimepiride (AMARYL) 2 MG tablet TAKE 1 TABLET BY MOUTH DAILY  WITH BREAKFAST   hydrocortisone 2.5 % ointment Apply topically 2 (two) times daily. To lesion on arm and buttocks   insulin glargine (LANTUS) 100 UNIT/ML injection Inject 0.4 mLs (40 Units total) into the skin every morning. Pt taking QAM (Patient taking differently: Inject 27 Units into the skin every morning. Pt taking QAM)   losartan (COZAAR) 50 MG tablet TAKE 1 TABLET BY MOUTH DAILY   meloxicam (MOBIC) 7.5 MG tablet Take 1 tablet (7.5 mg total) by mouth daily.   metFORMIN (GLUCOPHAGE) 1000 MG tablet TAKE 1 TABLET BY MOUTH TWICE  DAILY WITH MEALS   metoprolol tartrate (LOPRESSOR) 50 MG tablet TAKE 1 TABLET BY MOUTH TWICE  DAILY   montelukast (SINGULAIR) 10 MG tablet TAKE 1 TABLET BY MOUTH DAILY   nystatin (MYCOSTATIN) 100000 UNIT/ML suspension 1 teaspoon  swish, gargle and spit four  times per day.   Omega-3 Fatty Acids (FISH OIL) 1000 MG CAPS Take by mouth.   omeprazole (PRILOSEC) 20 MG capsule TAKE 1 CAPSULE BY MOUTH  DAILY   ONETOUCH DELICA PLUS LANCETS MISC 1 each by Does not apply route 2 (two) times daily.   Semaglutide, 1 MG/DOSE, (OZEMPIC, 1 MG/DOSE,) 4 MG/3ML SOPN Inject into the skin. Inject 1 mg into the skin every 7 days   traZODone (DESYREL) 50 MG tablet TAKE 1 TABLET BY MOUTH AT  BEDTIME   vitamin B-12 (CYANOCOBALAMIN) 1000 MCG tablet Take 1,000 mcg by mouth daily.   No facility-administered encounter medications on file as of 10/17/2021.    Allergies (verified) Prednisone    History: Past Medical History:  Diagnosis Date   Acid reflux    Chest pain    Diabetes mellitus without complication (HCC)    Hepatitis    age 9   High cholesterol    Hypertension    Kidney stones    Migraines    Psoriasis    Rapid heart rate    Shingles    spring 2021   Vertigo    1x - approx 12 yrs ago   Weight loss    Past Surgical History:  Procedure Laterality Date   BREAST BIOPSY Right    neg   CATARACT EXTRACTION W/PHACO Left 09/27/2019   Procedure: CATARACT EXTRACTION PHACO AND INTRAOCULAR LENS PLACEMENT ([place]) LEFT DIABETIC ISTENT INJ INTRAVITREAL KENALOG INJ;  Surgeon: Eulogio Bear, MD;  Location: [place];  Service: Ophthalmology;  Laterality: Left;  2.83 0:28.7   CATARACT EXTRACTION W/PHACO Right 12/13/2019   Procedure: CATARACT EXTRACTION PHACO AND INTRAOCULAR LENS PLACEMENT ([place]) RIGHT ISTENT INJ KENALOG INJ DIABETIC;  Surgeon: Eulogio Bear, MD;  Location: [employer];  Service: Ophthalmology;  Laterality: Right;  1.39 0:21.5   CESAREAN SECTION     Family History  Problem Relation Age of Onset   Breast cancer Neg Hx    Diabetes Brother    Heart attack Brother    Diabetes Brother    Heart attack Brother    Diabetes Brother    Social History   Socioeconomic History   Marital status: Married    Spouse name: Not on file   Number of children: 1   Years of education: Not on file   Highest education level: Not on file  Occupational History   Not on file  Tobacco Use   Smoking status: Never   Smokeless tobacco: Never  Vaping Use   Vaping Use: Never used  Substance and Sexual Activity   Alcohol use: Never   Drug use: Never   Sexual activity: Not on file  Other Topics Concern   Not on file  Social History Narrative   ** Merged History Encounter **            Social Determinants of Health   Financial Resource Strain: Low Risk  (07/06/2021)   Overall Financial Resource Strain (CARDIA)    Difficulty of Paying  Living Expenses: Not hard at all  Food Insecurity: No Food Insecurity (07/06/2021)   Hunger Vital Sign    Worried About Running Out of Food in the Last Year: Never true    [place] in the Last Year: Never true  Transportation Needs: No Transportation Needs (07/06/2021)   PRAPARE - Hydrologist (Medical): No    Lack of Transportation (Non-Medical): No  Physical Activity: Inactive (10/16/2020)   Exercise Vital  Sign    Days of Exercise per Week: 0 days    Minutes of Exercise per Session: 0 min  Stress: No Stress Concern Present (10/16/2020)   Summers    Feeling of Stress : Only a little  Social Connections: Moderately Isolated (10/16/2020)   Social Connection and Isolation Panel [NHANES]    Frequency of Communication with Friends and Family: More than three times a week    Frequency of Social Gatherings with Friends and Family: Twice a week    Attends Religious Services: Never    Marine scientist or Organizations: No    Attends Archivist Meetings: Never    Marital Status: Married    Tobacco Counseling Counseling given: Not Answered   Clinical Intake:                 Diabetic? Yes. BS this morning ***.         Activities of Daily Living    04/19/2021   11:40 AM 04/11/2021    2:00 PM  In your present state of health, do you have any difficulty performing the following activities:  Hearing? 1 1  Vision? 1 1  Difficulty concentrating or making decisions? 1 1  Walking or climbing stairs? 1 1  Dressing or bathing? 0 0  Doing errands, shopping? 0 1    Patient Care Team: Glean Hess, MD as PCP - General (Internal Medicine) Lonia Farber, MD as Consulting Physician (Endocrinology) Laneta Simmers as Physician Assistant (Urology) Eulogio Bear, MD as Consulting Physician (Ophthalmology) Lonia Blood, MD as Referring  Physician (Dermatology) Yolonda Kida, MD as Consulting Physician (Cardiology) Glean Hess, MD (Internal Medicine)  Indicate any recent Medical Services you may have received from other than [facility] providers in the past year (date may be approximate).     Assessment:   This is a routine wellness examination for Shereena.  Hearing/Vision screen No results found.  Dietary issues and exercise activities discussed:     Goals Addressed   None   Depression Screen    08/30/2021    8:54 AM 08/15/2021    3:43 PM 07/30/2021    2:49 PM 07/24/2021   10:23 AM 07/09/2021    2:21 PM 07/06/2021    1:58 PM 04/24/2021    2:06 PM  PHQ 2/9 Scores  PHQ - 2 Score _0 0 2  PHQ- 9 Score _1 Fall Risk    08/30/2021    8:55 AM 08/15/2021    3:44 PM 07/30/2021    2:50 PM 07/24/2021   10:23 AM 07/09/2021    2:23 PM  Fall Risk   Falls in the past year? 0 0 0 0 0  Number falls in past yr: 0 0 0  0  Injury with Fall? 0 0 0 0 0  Risk for fall due to : No Fall Risks No Fall Risks No Fall Risks  No Fall Risks  Follow up Falls evaluation completed Falls evaluation completed Falls evaluation completed  Falls evaluation completed    [place]:  Any stairs in or around the home? {YES/NO:21197} If so, are there any without handrails? {YES/NO:21197} Home free of loose throw rugs in walkways, pet beds, electrical cords, etc? {YES/NO:21197} Adequate lighting in your home to reduce risk of falls? {YES/NO:21197}  ASSISTIVE DEVICES UTILIZED TO PREVENT  FALLS:  Life alert? {YES/NO:21197} Use of a cane, walker or w/c? {YES/NO:21197} Grab bars in the bathroom? {YES/NO:21197} Shower chair or bench in shower? {YES/NO:21197} Elevated toilet seat or a handicapped toilet? {YES/NO:21197}  TIMED UP AND GO:  Was the test performed? {YES/NO:21197}.  Length of time to ambulate 10 feet: *** sec.   {Appearance of ZOXW:9604540}  Cognitive Function:         10/13/2019   10:33 AM 10/07/2018   10:21 AM  6CIT Screen  What Year? 0 points 0 points  What month? 0 points 0 points  What time? 0 points 0 points  Count back from 20 0 points 0 points  Months in reverse 0 points 0 points  Repeat phrase 0 points 0 points  Total Score 0 points 0 points    Immunizations Immunization History  Administered Date(s) Administered   Influenza, High Dose Seasonal PF 01/07/2018, 12/02/2018   Influenza-Unspecified 10/26/2016, 11/25/2020   PFIZER(Purple Top)SARS-COV-2 Vaccination 04/21/2019, 05/12/2019, 11/23/2019, 06/16/2020   Pneumococcal Conjugate-13 03/19/2017   Pneumococcal Polysaccharide-23 08/20/2018   Zoster Recombinat (Shingrix) 04/30/2017    TDAP status: Due, Education has been provided regarding the importance of this vaccine. Advised may receive this vaccine at local pharmacy or Health Dept. Aware to provide a copy of the vaccination record if obtained from local pharmacy or Health Dept. Verbalized acceptance and understanding.  {Flu Vaccine status:2101806}  Pneumococcal vaccine status: Up to date  Covid-19 vaccine status: Completed vaccines  Qualifies for Shingles Vaccine? Yes   Zostavax completed No   Shingrix Completed?: No.    Education has been provided regarding the importance of this vaccine. Patient has been advised to call insurance company to determine out of pocket expense if they have not yet received this vaccine. Advised may also receive vaccine at local pharmacy or Health Dept. Verbalized acceptance and understanding.  Screening Tests Health Maintenance  Topic Date Due   TETANUS/TDAP  Never done   Zoster Vaccines- Shingrix (2 of 2) 06/25/2017   COVID-19 Vaccine (5 - Pfizer risk series) 08/11/2020   INFLUENZA VACCINE  09/25/2021   OPHTHALMOLOGY EXAM  10/17/2021   MAMMOGRAM  10/25/2021   HEMOGLOBIN A1C  01/06/2022   FOOT EXAM  08/31/2022   DEXA SCAN  11/01/2023   COLONOSCOPY (Pts 45-41yr Insurance coverage will need to be  confirmed)  09/25/2025   Pneumonia Vaccine 71 Years old  Completed   Hepatitis C Screening  Completed   HPV VACCINES  Aged Out    Health Maintenance  Health Maintenance Due  Topic Date Due   TETANUS/TDAP  Never done   Zoster Vaccines- Shingrix (2 of 2) 06/25/2017   COVID-19 Vaccine (5 - Pfizer risk series) 08/11/2020   INFLUENZA VACCINE  09/25/2021    Colorectal cancer screening: Type of screening: Colonoscopy. Completed 09/26/2015. Repeat every 10 years  Mammogram status: Completed 10/25/2020. Repeat every year. Scheduled for 10/31/2021 for next mammo.  Bone Density status: Completed 10/31/2020. Results reflect: Bone density results: OSTEOPENIA. Repeat every 3 years.  Lung Cancer Screening: (Low Dose CT Chest recommended if Age 847-80years, 30 pack-year currently smoking OR have quit w/in 15years.) does not qualify.   Lung Cancer Screening Referral: N/A  Additional Screening:  Hepatitis C Screening: does qualify; Completed 06/02/2017  Vision Screening: Recommended annual ophthalmology exams for early detection of glaucoma and other disorders of the eye. Is the patient up to date with their annual eye exam?  {YES/NO:21197} Who is the provider or what is the name of the office in  which the patient attends annual eye exams? *** If pt is not established with a provider, would they like to be referred to a provider to establish care? {YES/NO:21197}.   Dental Screening: Recommended annual dental exams for proper oral hygiene  Community Resource Referral / Chronic Care Management: CRR required this visit?  {YES/NO:21197}  CCM required this visit?  {YES/NO:21197}     Plan:     I have personally reviewed and noted the following in the patient's chart:   Medical and social history Use of alcohol, tobacco or illicit drugs  Current medications and supplements including opioid prescriptions. Patient is not currently taking opioid prescriptions. Functional ability and  status Nutritional status Physical activity Advanced directives List of other physicians Hospitalizations, surgeries, and ER visits in previous 12 months Vitals Screenings to include cognitive, depression, and falls Referrals and appointments  In addition, I have reviewed and discussed with patient certain preventive protocols, quality metrics, and best practice recommendations. A written personalized care plan for preventive services as well as general preventive health recommendations were provided to patient.     Clista Bernhardt, [place]   10/16/2021   Nurse Notes: ***   Ms. Dehaas , Thank you for taking time to come for your Medicare Wellness Visit. I appreciate your ongoing commitment to your health goals. Please review the following plan we discussed and let me know if I can assist you in the future.   These are the goals we discussed:  Goals      Increase physical activity     Recommend increasing physical activity to 150 minutes per week        This is a list of the screening recommended for you and due dates:  Health Maintenance  Topic Date Due   Tetanus Vaccine  Never done   Zoster (Shingles) Vaccine (2 of 2) 06/25/2017   COVID-19 Vaccine (5 - Pfizer risk series) 08/11/2020   Flu Shot  09/25/2021   Eye exam for diabetics  10/17/2021   Mammogram  10/25/2021   Hemoglobin A1C  01/06/2022   Complete foot exam   08/31/2022   DEXA scan (bone density measurement)  11/01/2023   Colon Cancer Screening  09/25/2025   Pneumonia Vaccine  Completed   Hepatitis C Screening: USPSTF Recommendation to screen - Ages 59-79 yo.  Completed   HPV Vaccine  Aged Out

## 2021-10-24 ENCOUNTER — Encounter: Payer: Self-pay | Admitting: Internal Medicine

## 2021-10-31 ENCOUNTER — Ambulatory Visit
Admission: RE | Admit: 2021-10-31 | Discharge: 2021-10-31 | Disposition: A | Discharge status: Home / Self Care | Payer: Medicare Other | Source: Ambulatory Visit | Attending: Internal Medicine | Admitting: Internal Medicine

## 2021-10-31 DIAGNOSIS — Z1231 Encounter for screening mammogram for malignant neoplasm of breast: Secondary | ICD-10-CM | POA: Diagnosis not present

## 2021-10-31 DIAGNOSIS — E113212 Type 2 diabetes mellitus with mild nonproliferative diabetic retinopathy with macular edema, left eye: Secondary | ICD-10-CM | POA: Diagnosis not present

## 2021-11-06 NOTE — Unmapped (Signed)
[facility] Refill Coordination Note    Specialty Medication(s) to be Shipped:   Inflammatory Disorders: Dupixent    Other medication(s) to be shipped: No additional medications requested for fill at this time     Monique Coleman, DOB: March 05, 1950  Phone: [phone] (home)       All above HIPAA information was verified with patient.     Was a Nurse, learning disability used for this call? No    Completed refill call assessment today to schedule patient's medication shipment from the [facility] [phone]).  All relevant notes have been reviewed.     Specialty medication(s) and dose(s) confirmed: Regimen is correct and unchanged.   Changes to medications: Lesleigh reports no changes at this time.  Changes to insurance: No  New side effects reported not previously addressed with a pharmacist or physician: None reported  Questions for the pharmacist: No    Confirmed patient received a Conservation officer, historic buildings and a Surveyor, mining with first shipment. The patient will receive a drug information handout for each medication shipped and additional FDA Medication Guides as required.       DISEASE/MEDICATION-SPECIFIC INFORMATION        For patients on injectable medications: Patient currently has 0 doses left.  Next injection is scheduled for 11/09/2021.    SPECIALTY MEDICATION ADHERENCE     Medication Adherence    Patient reported X missed doses in the last month: 0  Specialty Medication: Dupixent  Patient is on additional specialty medications: No  Any gaps in refill history greater than 2 weeks in the last 3 months: no  Demonstrates understanding of importance of adherence: yes  Informant: patient  Reliability of informant: reliable              Confirmed plan for next specialty medication refill: delivery by pharmacy  Refills needed for supportive medications: not needed              Were doses missed due to medication being on hold? No    DUPIXENT PEN 300 mg/2 mL Pnij (dupilumab)  : 0 days of medicine on hand        REFERRAL TO PHARMACIST     Referral to the pharmacist: Not needed      [facility]     Shipping address confirmed in Epic.     Delivery Scheduled: Yes, Expected medication delivery date: 11/08/2021.     Medication will be delivered via Same Day Courier to the prescription address in Epic WAM.    Valere Dross   [facility] Specialty Technician

## 2021-11-08 DIAGNOSIS — L281 Prurigo nodularis: Principal | ICD-10-CM

## 2021-11-08 MED FILL — DUPIXENT 300 MG/2 ML SUBCUTANEOUS PEN INJECTOR: SUBCUTANEOUS | 28 days supply | Qty: 4 | Fill #5

## 2021-11-12 ENCOUNTER — Other Ambulatory Visit: Payer: Self-pay | Admitting: Internal Medicine

## 2021-11-13 NOTE — Telephone Encounter (Signed)
Requested Prescriptions  Pending Prescriptions Disp Refills  . glimepiride (AMARYL) 2 MG tablet [Pharmacy Med Name: Glimepiride 2 MG Oral Tablet] 100 tablet 1    Sig: TAKE 1 TABLET BY MOUTH DAILY  WITH BREAKFAST     Endocrinology:  Diabetes - Sulfonylureas Passed - 11/12/2021  4:21 AM      Passed - HBA1C is between 0 and 7.9 and within 180 days    Hemoglobin A1C  Date Value Ref Range Status  07/06/2021 7.0  Final         Passed - Cr in normal range and within 360 days    Creatinine, Ser  Date Value Ref Range Status  08/30/2021 0.93 0.57 - 1.00 mg/dL Final         Passed - Valid encounter within last 6 months    Recent Outpatient Visits          2 months ago Annual physical exam   [place] [facility], Jesse Sans, MD   3 months ago Chronic right shoulder pain   [place] [facility] at [facility], Jesse Sans, MD   3 months ago Impingement syndrome of shoulder, right   [place] [facility] at [place], Earley Abide, MD   3 months ago Impingement syndrome of shoulder, right   [facility] at [place], Earley Abide, MD   4 months ago Impingement syndrome of shoulder, right   [facility] at [facility], Earley Abide, MD      Future Appointments            In 3 months Army Melia, Jesse Sans, MD [facility] at [facility] [place], [facility]   In 9 months Army Melia, Jesse Sans, MD [place] [facility] at [facility], [facility]

## 2021-11-16 DIAGNOSIS — E1159 Type 2 diabetes mellitus with other circulatory complications: Secondary | ICD-10-CM | POA: Diagnosis not present

## 2021-11-16 DIAGNOSIS — I152 Hypertension secondary to endocrine disorders: Secondary | ICD-10-CM | POA: Diagnosis not present

## 2021-11-16 DIAGNOSIS — E785 Hyperlipidemia, unspecified: Secondary | ICD-10-CM | POA: Diagnosis not present

## 2021-11-16 DIAGNOSIS — Z794 Long term (current) use of insulin: Secondary | ICD-10-CM | POA: Diagnosis not present

## 2021-11-16 DIAGNOSIS — Z79899 Other long term (current) drug therapy: Secondary | ICD-10-CM | POA: Diagnosis not present

## 2021-11-16 DIAGNOSIS — E1142 Type 2 diabetes mellitus with diabetic polyneuropathy: Secondary | ICD-10-CM | POA: Diagnosis not present

## 2021-11-16 DIAGNOSIS — E1169 Type 2 diabetes mellitus with other specified complication: Secondary | ICD-10-CM | POA: Diagnosis not present

## 2021-11-16 LAB — VITAMIN B12: Vitamin B-12: 378

## 2021-11-16 LAB — HEMOGLOBIN A1C: Hemoglobin A1C: 7.1

## 2021-11-17 MED ORDER — MUPIROCIN 2 % TOPICAL OINTMENT
Freq: Three times a day (TID) | TOPICAL | 1 refills | 0.00000 days
Start: 2021-11-17 — End: ?

## 2021-11-18 MED ORDER — MUPIROCIN 2 % TOPICAL OINTMENT
Freq: Three times a day (TID) | TOPICAL | 1 refills | 0.00000 days | Status: CP
Start: 2021-11-18 — End: ?
  Filled 2022-01-30: qty 22, 7d supply, fill #1

## 2021-11-18 NOTE — Unmapped (Signed)
Prescription refill request for mupirocin, Last office visit was 07/11/21    Please Advise

## 2021-11-28 DIAGNOSIS — E113212 Type 2 diabetes mellitus with mild nonproliferative diabetic retinopathy with macular edema, left eye: Secondary | ICD-10-CM | POA: Diagnosis not present

## 2021-11-30 NOTE — Unmapped (Signed)
[facility] Clinical Intervention    Type of intervention: Medication administration    Medication involved: Dupixent    Problem identified: Ms. Monique Coleman reports severe pain each time she gives Dupixent injection. When discussing injection technique, she confirmed that there is no leakage or issue giving dose and that she rotates injection site each time. She also reports waiting 30 minutes to allow Dupixent to warm up to room temperature before injecting. She is inquiring what techniques can be done to reduce pain.  and denies  2nd time - gave on thigh. Hurts a lot. Rotating injection sites properly.     Intervention performed: We discussed waiting a full hour to allow Dupixent to be at room temperature before injecting Dupixent. We also discussed either taking Tylenol prior or using a numbing cream or cold compress at the site of injection to potentially prevent pain before injecting Dupixent. I encouraged Monique Coleman to try these methods and inform provider if pain is still persisting in case Dupixent should be switched to Dupixent prefilled syringes instead.     Follow-up needed: Will follow-up next month    Approximate time spent: 10-15 minutes    Clinical evidence used to support intervention: Professional judgement    Result of the intervention: Prevention of an adverse drug event    Oliva Bustard, PharmD   [facility] Specialty Pharmacist

## 2021-11-30 NOTE — Unmapped (Signed)
[facility] Refill Coordination Note    Specialty Medication(s) to be Shipped:   Inflammatory Disorders: Dupixent    Other medication(s) to be shipped: No additional medications requested for fill at this time     Monique Coleman, DOB: [birth year]  Phone: [phone] (home)       All above HIPAA information was verified with patient.     Was a Nurse, learning disability used for this call? No    Completed refill call assessment today to schedule patient's medication shipment from the [facility] [phone]).  All relevant notes have been reviewed.     Specialty medication(s) and dose(s) confirmed: Regimen is correct and unchanged.   Changes to medications: Makenize reports no changes at this time.  Changes to insurance: No  New side effects reported not previously addressed with a pharmacist or physician: None reported  Questions for the pharmacist: No    Confirmed patient received a Conservation officer, historic buildings and a Surveyor, mining with first shipment. The patient will receive a drug information handout for each medication shipped and additional FDA Medication Guides as required.       DISEASE/MEDICATION-SPECIFIC INFORMATION        For patients on injectable medications: Patient currently has 0 doses left.  Next injection is scheduled for 10/13.    SPECIALTY MEDICATION ADHERENCE     Medication Adherence    Patient reported X missed doses in the last month: 0  Specialty Medication: Dupixent  Patient is on additional specialty medications: No  Any gaps in refill history greater than 2 weeks in the last 3 months: no  Demonstrates understanding of importance of adherence: yes  Informant: patient  Reliability of informant: reliable              Confirmed plan for next specialty medication refill: delivery by pharmacy  Refills needed for supportive medications: not needed              Were doses missed due to medication being on hold? No    DUPIXENT PEN 300 mg/2 mL Pnij (dupilumab)  : 0 days of medicine on hand        REFERRAL TO PHARMACIST     Referral to the pharmacist: Not needed      [facility]     Shipping address confirmed in Epic.     Delivery Scheduled: Yes, Expected medication delivery date: 10/11.     Medication will be delivered via Same Day Courier to the prescription address in Epic WAM.    Valere Dross   [facility] Specialty Technician

## 2021-12-03 ENCOUNTER — Other Ambulatory Visit: Payer: Self-pay | Admitting: Internal Medicine

## 2021-12-03 DIAGNOSIS — I2510 Atherosclerotic heart disease of native coronary artery without angina pectoris: Secondary | ICD-10-CM

## 2021-12-03 DIAGNOSIS — I10 Essential (primary) hypertension: Secondary | ICD-10-CM

## 2021-12-04 NOTE — Telephone Encounter (Signed)
Requested Prescriptions  Pending Prescriptions Disp Refills  . metoprolol tartrate (LOPRESSOR) 50 MG tablet [Pharmacy Med Name: Metoprolol Tartrate 50 MG Oral Tablet] 200 tablet 1    Sig: TAKE 1 TABLET BY MOUTH TWICE  DAILY     Cardiovascular:  Beta Blockers Passed - 12/03/2021  4:32 PM      Passed - Last BP in normal range    BP Readings from Last 1 Encounters:  08/30/21 120/80         Passed - Last Heart Rate in normal range    Pulse Readings from Last 1 Encounters:  08/30/21 85         Passed - Valid encounter within last 6 months    Recent Outpatient Visits          3 months ago Annual physical exam   [place] [facility] at [facility], Jesse Sans, MD   3 months ago Chronic right shoulder pain   [place] [facility] at [facility], Jesse Sans, MD   4 months ago Impingement syndrome of shoulder, right   [place] [facility] at [place], Earley Abide, MD   4 months ago Impingement syndrome of shoulder, right   [facility] at [place], Earley Abide, MD   4 months ago Impingement syndrome of shoulder, right   [facility] at [facility], Earley Abide, MD      Future Appointments            In 3 months Army Melia, Jesse Sans, MD [facility] at [facility], [facility]   In 9 months Army Melia, Jesse Sans, MD [place] [facility] at [facility], [facility]

## 2021-12-05 MED FILL — DUPIXENT 300 MG/2 ML SUBCUTANEOUS PEN INJECTOR: SUBCUTANEOUS | 28 days supply | Qty: 4 | Fill #6

## 2021-12-17 ENCOUNTER — Other Ambulatory Visit: Payer: Self-pay | Admitting: Internal Medicine

## 2021-12-17 DIAGNOSIS — E118 Type 2 diabetes mellitus with unspecified complications: Secondary | ICD-10-CM

## 2021-12-18 NOTE — Telephone Encounter (Signed)
Requested Prescriptions  Pending Prescriptions Disp Refills  . losartan (COZAAR) 50 MG tablet [Pharmacy Med Name: Losartan Potassium 50 MG Oral Tablet] 100 tablet 1    Sig: TAKE 1 TABLET BY MOUTH DAILY     Cardiovascular:  Angiotensin Receptor Blockers Failed - 12/17/2021  4:23 AM      Failed - K in normal range and within 180 days    Potassium  Date Value Ref Range Status  08/30/2021 5.4 (H) 3.5 - 5.2 mmol/L Final         Passed - Cr in normal range and within 180 days    Creatinine, Ser  Date Value Ref Range Status  08/30/2021 0.93 0.57 - 1.00 mg/dL Final         Passed - Patient is not pregnant      Passed - Last BP in normal range    BP Readings from Last 1 Encounters:  08/30/21 120/80         Passed - Valid encounter within last 6 months    Recent Outpatient Visits          3 months ago Annual physical exam   [place] [facility] at [facility], Jesse Sans, MD   4 months ago Chronic right shoulder pain   [place] [facility] at [facility], Jesse Sans, MD   4 months ago Impingement syndrome of shoulder, right   [place] [facility] at [place], Earley Abide, MD   4 months ago Impingement syndrome of shoulder, right   [facility] at [place], Earley Abide, MD   5 months ago Impingement syndrome of shoulder, right   [facility] at [facility], Earley Abide, MD      Future Appointments            In 2 months Army Melia, Jesse Sans, MD [facility] Medicine at [facility], [facility]   In 8 months Army Melia, Jesse Sans, MD [place] [facility] at [facility], [facility]

## 2021-12-25 ENCOUNTER — Ambulatory Visit: Payer: Self-pay

## 2021-12-25 ENCOUNTER — Ambulatory Visit: Payer: Medicare Other | Admitting: Family Medicine

## 2021-12-25 ENCOUNTER — Telehealth: Payer: Self-pay

## 2021-12-25 NOTE — Telephone Encounter (Signed)
Summary: Pt husband requests appt today due to blood pressure but there are no appts available   Pt husband reports pt needs an appt today because she is not feeling well and he thinks it is due to her blood pressure. Pt husband does not know whether pt blood pressure is elevated or low he just kept asking that the pt be seen today but there are no appts available. Pt husband requests call back. Cb# [phone]        Chief Complaint: Chest pain 1x Symptoms: Chest pain/tightness//indigestion Frequency: last night Pertinent Negatives: Patient denies Continuing chest pain, SOB, dizziness Disposition: '[]'$ ED /'[]'$ Urgent Care (no appt availability in office) / '[x]'$ Appointment(In office/virtual)/ '[]'$  [place] [facility]/ '[]'$ Home Care/ '[]'$ Refused Recommended Disposition /'[]'$ [place] [facility]/ '[]'$  Follow-up with PCP Additional Notes: Pt states that she had chest/ left shoulder pain , with chests tightness 1x last night. PT also had indigestion. PT is also unable to take her blood pressure. Cuff is inflating to over 200 and then trying to inflate more. PT has tried this several times and has replaced the battery without success. Pt states that she has had several heart studies in the past and all were good and she does not think this is her heart. Pt does not want to go to ED.     Reason for Disposition  [1] Chest pain lasts < 5 minutes AND [2] NO chest pain or cardiac symptoms (e.g., breathing difficulty, sweating) now  (Exception: Chest pains that last only a few seconds.)  Answer Assessment - Initial Assessment Questions 1. LOCATION: "Where does it hurt?"       Chest and left shoulder - during dinner 2. RADIATION: "Does the pain go anywhere else?" (e.g., into neck, jaw, arms, back)     Left shoulder - chest tightened 3. ONSET: "When did the chest pain begin?" (Minutes, hours or days)      6:30 pm 4. PATTERN: "Does the pain come and go, or has it been constant since it started?"  "Does it get  worse with exertion?"      1 occurrence 5. DURATION: "How long does it last" (e.g., seconds, minutes, hours)     5 minutes 6. SEVERITY: "How bad is the pain?"  (e.g., Scale 1-10; mild, moderate, or severe)    - MILD (1-3): doesn't interfere with normal activities     - MODERATE (4-7): interferes with normal activities or awakens from sleep    - SEVERE (8-10): excruciating pain, unable to do any normal activities       5-6/10 7. CARDIAC RISK FACTORS: "Do you have any history of heart problems or risk factors for heart disease?" (e.g., angina, prior heart attack; diabetes, high blood pressure, high cholesterol, smoker, or strong family history of heart disease)     DM 8. PULMONARY RISK FACTORS: "Do you have any history of lung disease?"  (e.g., blood clots in lung, asthma, emphysema, birth control pills)      9. CAUSE: "What do you think is causing the chest pain?"     Unsure - Chest tightness 10. OTHER SYMPTOMS: "Do you have any other symptoms?" (e.g., dizziness, nausea, vomiting, sweating, fever, difficulty breathing, cough)       Chest tightness 11. PREGNANCY: "Is there any chance you are pregnant?" "When was your last menstrual period?"       na  Protocols used: Chest Pain-A-AH

## 2021-12-25 NOTE — Telephone Encounter (Signed)
Called and spoke with patient. Informed since she is having chest pain she needs to go to the ER for this. Explained per provider if she comes in, we will have to send her to the ER for a full eval of her heart due to chest pain.  She said she wants to cancel her appt today. Asked her to please go to the ER- especially if symptoms worsen. She verbalized understanding of this.  - Dene Nazir

## 2022-01-02 DIAGNOSIS — E113212 Type 2 diabetes mellitus with mild nonproliferative diabetic retinopathy with macular edema, left eye: Secondary | ICD-10-CM | POA: Diagnosis not present

## 2022-01-03 NOTE — Unmapped (Signed)
[facility] Refill Coordination Note    Specialty Medication(s) to be Shipped:   Inflammatory Disorders: Dupixent    Other medication(s) to be shipped: No additional medications requested for fill at this time     Monique Coleman, DOB: 1951-01-01  Phone: [phone] (home)       All above HIPAA information was verified with patient.     Was a Nurse, learning disability used for this call? No    Completed refill call assessment today to schedule patient's medication shipment from the [facility] [phone]).  All relevant notes have been reviewed.     Specialty medication(s) and dose(s) confirmed: Regimen is correct and unchanged.   Changes to medications: Renuka reports no changes at this time.  Changes to insurance: No  New side effects reported not previously addressed with a pharmacist or physician: None reported  Questions for the pharmacist: No    Confirmed patient received a Conservation officer, historic buildings and a Surveyor, mining with first shipment. The patient will receive a drug information handout for each medication shipped and additional FDA Medication Guides as required.       DISEASE/MEDICATION-SPECIFIC INFORMATION        For patients on injectable medications: Patient currently has 0 doses left.  Next injection is scheduled for 11/10.    SPECIALTY MEDICATION ADHERENCE     Medication Adherence    Patient reported X missed doses in the last month: 0  Specialty Medication: Dupixent  Patient is on additional specialty medications: No  Any gaps in refill history greater than 2 weeks in the last 3 months: no  Demonstrates understanding of importance of adherence: yes  Informant: patient  Reliability of informant: reliable              Confirmed plan for next specialty medication refill: delivery by pharmacy  Refills needed for supportive medications: not needed              Were doses missed due to medication being on hold? No    DUPIXENT PEN 300 mg/2 mL Pnij (dupilumab)  : 0 days of medicine on hand        REFERRAL TO PHARMACIST     Referral to the pharmacist: Not needed      [facility]     Shipping address confirmed in Epic.     Delivery Scheduled: Yes, Expected medication delivery date: 11/10.     Medication will be delivered via Same Day Courier to the prescription address in Epic WAM.    Valere Dross   [facility] Specialty Technician

## 2022-01-04 MED FILL — DUPIXENT 300 MG/2 ML SUBCUTANEOUS PEN INJECTOR: SUBCUTANEOUS | 28 days supply | Qty: 4 | Fill #7

## 2022-01-07 ENCOUNTER — Ambulatory Visit (INDEPENDENT_AMBULATORY_CARE_PROVIDER_SITE_OTHER): Payer: Medicare Other | Admitting: Internal Medicine

## 2022-01-07 ENCOUNTER — Encounter: Payer: Self-pay | Admitting: Internal Medicine

## 2022-01-07 VITALS — BP 108/62 | HR 92 | Ht 59.0 in | Wt 139.0 lb

## 2022-01-07 DIAGNOSIS — I10 Essential (primary) hypertension: Secondary | ICD-10-CM

## 2022-01-07 DIAGNOSIS — G44219 Episodic tension-type headache, not intractable: Secondary | ICD-10-CM

## 2022-01-07 NOTE — Patient Instructions (Signed)
Coricidin HBP - take 1 twice a day as needed  Try to take less Tylenol as this can cause a rebound headache

## 2022-01-07 NOTE — Progress Notes (Signed)
Date:  01/07/2022   Name:  Elizabeth Klein   DOB:  [birth year]   MRN:  [id number]   Chief Complaint: Headache  Headache  This is a new problem. The current episode started in the past 7 days (X1.5 weeks). The problem occurs daily. The problem has been unchanged. Pain location: over entire scalp. The pain does not radiate. The quality of the pain is described as shooting and stabbing. The pain is at a severity of 4/10. The pain is mild. Associated symptoms include dizziness. Pertinent negatives include no coughing, fever, sinus pressure, sore throat or weakness. Nothing aggravates the symptoms. She has tried acetaminophen for the symptoms. The treatment provided significant (HA often resolves for several hours or even the rest of the day) relief.    Lab Results  Component Value Date   NA 140 08/30/2021   K 5.4 (H) 08/30/2021   CO2 21 08/30/2021   GLUCOSE 73 08/30/2021   BUN 18 08/30/2021   CREATININE 0.93 08/30/2021   CALCIUM 9.7 08/30/2021   EGFR 67 08/30/2021   GFRNONAA >60 04/20/2021   Lab Results  Component Value Date   CHOL 124 08/30/2021   HDL 50 08/30/2021   LDLCALC 55 08/30/2021   TRIG 103 08/30/2021   CHOLHDL 2.5 08/30/2021   Lab Results  Component Value Date   TSH 1.360 08/30/2021   Lab Results  Component Value Date   HGBA1C 7.1 11/16/2021   Lab Results  Component Value Date   WBC 12.4 (H) 08/30/2021   HGB 11.8 08/30/2021   HCT 36.5 08/30/2021   MCV 90 08/30/2021   PLT 356 08/30/2021   Lab Results  Component Value Date   ALT 24 08/30/2021   AST 18 08/30/2021   ALKPHOS 76 08/30/2021   BILITOT 0.5 08/30/2021   Lab Results  Component Value Date   VD25OH 38.6 08/30/2021     Review of Systems  Constitutional:  Negative for chills, fatigue and fever.  HENT:  Negative for congestion, sinus pressure, sore throat and trouble swallowing.   Respiratory:  Negative for cough, chest tightness and shortness of breath.   Neurological:  Positive for  dizziness and headaches. Negative for tremors and weakness.  Psychiatric/Behavioral:  Negative for dysphoric mood and sleep disturbance. The patient is not nervous/anxious.     Patient Active Problem List   Diagnosis Date Noted   Impingement syndrome of shoulder, right 07/09/2021   Cervical paraspinal muscle spasm 07/09/2021   Degeneration of lumbar intervertebral disc 04/19/2021   Oropharyngeal dysphagia 02/23/2019   Neuropathy due to type 2 diabetes mellitus ([place]) 08/20/2018   Microalbuminuria 04/30/2017   CKD (chronic kidney disease) stage 3, GFR 30-59 ml/min (HCC) 04/30/2017   Type II diabetes mellitus with complication ([place]) 42/68/3419   Essential hypertension 03/19/2017   CAD (coronary artery disease) 03/19/2017   History of kidney stones 03/19/2017   Psoriasis 03/19/2017   Hyperlipidemia associated with type 2 diabetes mellitus ([place]) 03/19/2017   Nocturnal leg cramps 03/19/2017   Gait instability 03/19/2017   Primary insomnia 03/19/2017   Allergic rhinitis 03/19/2017    Allergies  Allergen Reactions   Prednisone Other (See Comments)    Intolerance  - confusion     Past Surgical History:  Procedure Laterality Date   BREAST BIOPSY Right    neg   CATARACT EXTRACTION W/PHACO Left 09/27/2019   Procedure: CATARACT EXTRACTION PHACO AND INTRAOCULAR LENS PLACEMENT ([place]) LEFT DIABETIC ISTENT INJ INTRAVITREAL KENALOG INJ;  Surgeon: Eulogio Bear, MD;  Location:  [place];  Service: Ophthalmology;  Laterality: Left;  2.83 0:28.7   CATARACT EXTRACTION W/PHACO Right 12/13/2019   Procedure: CATARACT EXTRACTION PHACO AND INTRAOCULAR LENS PLACEMENT ([place]) RIGHT ISTENT INJ KENALOG INJ DIABETIC;  Surgeon: Eulogio Bear, MD;  Location: [place];  Service: Ophthalmology;  Laterality: Right;  1.39 0:21.5   CESAREAN SECTION      Social History   Tobacco Use   Smoking status: Never   Smokeless tobacco: Never  Vaping Use   Vaping Use: Never used   Substance Use Topics   Alcohol use: Never   Drug use: Never     Medication list has been reviewed and updated.  Current Meds  Medication Sig   ACCU-CHEK GUIDE test strip CHECK BLOOD SUGAR 3 TIMES DAILY   acetaminophen (TYLENOL) 500 MG tablet Take 1,000 mg by mouth every 8 (eight) hours as needed.   Ascorbic Acid (VITAMIN C PO) Take by mouth.   aspirin EC 81 MG tablet Take 1 tablet (81 mg total) by mouth daily.   atorvastatin (LIPITOR) 80 MG tablet TAKE 1 TABLET BY MOUTH ONCE  DAILY   BD INSULIN SYRINGE U/F 30G X 1/2" 0.5 ML MISC USE WITH LANTUS INJECTIONS  FOR TYPE 2 DIABETES  MELLITUS   blood glucose meter kit and supplies Dispense based on patient and insurance preference. Use up to four times daily as directed. (FOR ICD-10 E10.9, E11.9).   clobetasol (TEMOVATE) 0.05 % external solution Apply topically 2 (two) times daily. To scalp lesions   clobetasol ointment (TEMOVATE) 9.81 % Apply 1 application topically 2 (two) times daily.   DUPIXENT 300 MG/2ML SOPN Inject into the skin every 14 (fourteen) days.   gabapentin (NEURONTIN) 100 MG capsule TAKE 1 CAPSULE BY MOUTH 3 TIMES  DAILY   hydrocortisone 2.5 % ointment Apply topically 2 (two) times daily. To lesion on arm and buttocks   insulin glargine (LANTUS) 100 UNIT/ML injection Inject 0.4 mLs (40 Units total) into the skin every morning. Pt taking QAM (Patient taking differently: Inject 27 Units into the skin every morning. Pt taking QAM)   losartan (COZAAR) 50 MG tablet TAKE 1 TABLET BY MOUTH DAILY   metFORMIN (GLUCOPHAGE) 1000 MG tablet TAKE 1 TABLET BY MOUTH TWICE  DAILY WITH MEALS   metoprolol tartrate (LOPRESSOR) 50 MG tablet TAKE 1 TABLET BY MOUTH TWICE  DAILY (Patient taking differently: No sig reported)   montelukast (SINGULAIR) 10 MG tablet TAKE 1 TABLET BY MOUTH DAILY   Omega-3 Fatty Acids (FISH OIL) 1000 MG CAPS Take by mouth.   omeprazole (PRILOSEC) 20 MG capsule TAKE 1 CAPSULE BY MOUTH  DAILY   ONETOUCH DELICA PLUS LANCETS  MISC 1 each by Does not apply route 2 (two) times daily.   Semaglutide, 1 MG/DOSE, 2 MG/1.5ML SOPN Inject into the skin. Inject 2 mg into the skin every 7 days   traZODone (DESYREL) 50 MG tablet TAKE 1 TABLET BY MOUTH AT  BEDTIME   vitamin B-12 (CYANOCOBALAMIN) 1000 MCG tablet Take 1,000 mcg by mouth daily.       01/07/2022    4:07 PM 08/30/2021    8:55 AM 08/15/2021    3:43 PM 07/30/2021    2:50 PM  GAD 7 : Generalized Anxiety Score  Nervous, Anxious, on Edge 0 0 1 1  Control/stop worrying 0 0 1 1  Worry too much - different things 0 0 0 2  Trouble relaxing 0 0 0 2  Restless 0 0 2 1  Easily annoyed  or irritable 0 0 0 1  Afraid - awful might happen 0 0 0 0  Total GAD 7 Score 0 0 4 8  Anxiety Difficulty Not difficult at all Not difficult at all Very difficult        01/07/2022    4:07 PM 08/30/2021    8:54 AM 08/15/2021    3:43 PM  Depression screen PHQ 2/9  Decreased Interest _0 Down, Depressed, Hopeless _1 PHQ - 2 Score _2 Altered sleeping 1 0 2  Tired, decreased energy _3 Change in appetite _4 Feeling bad or failure about yourself  0 0 2  Trouble concentrating _5 Moving slowly or fidgety/restless 0 1 2  Suicidal thoughts 0 0 0  PHQ-9 Score _6 Difficult doing work/chores Not difficult at all Somewhat difficult Very difficult    BP Readings from Last 3 Encounters:  01/07/22 108/62  08/30/21 120/80  08/15/21 134/84    Physical Exam Vitals and nursing note reviewed.  Constitutional:      General: She is not in acute distress.    Appearance: She is well-developed.  HENT:     Head: Normocephalic and atraumatic.     Jaw: There is normal jaw occlusion.  Eyes:     Extraocular Movements: Extraocular movements intact.  Cardiovascular:     Rate and Rhythm: Normal rate and regular rhythm.  Pulmonary:     Effort: Pulmonary effort is normal. No respiratory distress.     Breath sounds: No wheezing or rhonchi.  Musculoskeletal:     Cervical  back: Normal range of motion and neck supple.  Skin:    General: Skin is warm and dry.     Findings: No rash.  Neurological:     Mental Status: She is alert and oriented to person, place, and time.     Cranial Nerves: No cranial nerve deficit or facial asymmetry.     Sensory: No sensory deficit.     Deep Tendon Reflexes: Reflexes normal.  Psychiatric:        Mood and Affect: Mood normal.        Behavior: Behavior normal.     Wt Readings from Last 3 Encounters:  01/07/22 139 lb (63 kg)  08/30/21 137 lb (62.1 kg)  08/15/21 135 lb (61.2 kg)    BP 108/62   Pulse 92   Ht _7  (1.499 m)   Wt 139 lb (63 kg)   SpO2 95%   BMI 28.07 kg/m   Assessment and Plan: 1. Episodic tension-type headache, not intractable Suspect this is due to sinus congestion/allergies No evidence infection or worrisome pathology May be partially rebound HA since she is taking tylenol up to twice per day. Trial of Coricidin HBP and try to avoid daily tylenol - only take when HA is severe Follow up if no improvement  2. Essential hypertension Clinically stable exam with well controlled BP. Tolerating medications without side effects at this time. Pt to continue current regimen and low sodium diet; benefits of regular exercise as able discussed.   Partially dictated using Editor, commissioning. Any errors are unintentional.  Halina Maidens, MD [facility]  01/07/2022

## 2022-01-16 NOTE — Unmapped (Signed)
[facility] Refill Coordination Note    Specialty Medication(s) to be Shipped:   Inflammatory Disorders: Dupixent    Other medication(s) to be shipped:  mupirocin     Monique Coleman, DOB: [birth year]  Phone: [phone] (home)       All above HIPAA information was verified with patient.     Was a Nurse, learning disability used for this call? No    Completed refill call assessment today to schedule patient's medication shipment from the [facility] [phone]).  All relevant notes have been reviewed.     Specialty medication(s) and dose(s) confirmed: Regimen is correct and unchanged.   Changes to medications: [name] reports no changes at this time.  Changes to insurance: No  New side effects reported not previously addressed with a pharmacist or physician: None reported  Questions for the pharmacist: No    Confirmed patient received a Conservation officer, historic buildings and a Surveyor, mining with first shipment. The patient will receive a drug information handout for each medication shipped and additional FDA Medication Guides as required.       DISEASE/MEDICATION-SPECIFIC INFORMATION        For patients on injectable medications: Patient currently has 1 doses left.  Next injection is scheduled for 12/01.    SPECIALTY MEDICATION ADHERENCE     Medication Adherence    Patient reported X missed doses in the last month: 0  Specialty Medication: Dupixent 300mg /71ml  Patient is on additional specialty medications: No  Patient is on more than two specialty medications: No  Any gaps in refill history greater than 2 weeks in the last 3 months: no  Demonstrates understanding of importance of adherence: yes  Informant: patient  Reliability of informant: reliable  Provider-estimated medication adherence level: good  Patient is at risk for Non-Adherence: No  Reasons for non-adherence: no problems identified                  Confirmed plan for next specialty medication refill: delivery by pharmacy  Refills needed for supportive medications: not needed          Refill Coordination    Has the Patients' Contact Information Changed: No  Is the Shipping Address Different: No         Were doses missed due to medication being on hold? No    dupixent 300/2 mg/ml: 14 days of medicine on hand      REFERRAL TO PHARMACIST     Referral to the pharmacist: Not needed      [facility]     Shipping address confirmed in Epic.     Delivery Scheduled: Yes, Expected medication delivery date: 12/06.     Medication will be delivered via Same Day Courier to the prescription address in Epic WAM.    Antonietta Barcelona   [facility] Specialty Technician

## 2022-01-28 ENCOUNTER — Other Ambulatory Visit: Payer: Self-pay | Admitting: Internal Medicine

## 2022-01-28 DIAGNOSIS — G4700 Insomnia, unspecified: Secondary | ICD-10-CM

## 2022-01-29 ENCOUNTER — Ambulatory Visit (INDEPENDENT_AMBULATORY_CARE_PROVIDER_SITE_OTHER): Payer: Medicare Other | Admitting: Internal Medicine

## 2022-01-29 ENCOUNTER — Encounter: Payer: Self-pay | Admitting: Internal Medicine

## 2022-01-29 VITALS — BP 118/74 | HR 95 | Ht 59.0 in | Wt 138.0 lb

## 2022-01-29 DIAGNOSIS — S8001XA Contusion of right knee, initial encounter: Secondary | ICD-10-CM

## 2022-01-29 NOTE — Progress Notes (Signed)
Date:  01/29/2022   Name:  Elizabeth Klein   DOB:  09/26/1950   MRN:  [id number]   Chief Complaint: Knee Pain (Patient c/o of a "bump below right knee." Noticed this 2 days ago. Tender to touch. No injury that patient knows of.)  Knee Pain  There was no injury mechanism. The pain is present in the right knee. The pain is mild (only with palpation).    Lab Results  Component Value Date   NA 140 08/30/2021   K 5.4 (H) 08/30/2021   CO2 21 08/30/2021   GLUCOSE 73 08/30/2021   BUN 18 08/30/2021   CREATININE 0.93 08/30/2021   CALCIUM 9.7 08/30/2021   EGFR 67 08/30/2021   GFRNONAA >60 04/20/2021   Lab Results  Component Value Date   CHOL 124 08/30/2021   HDL 50 08/30/2021   LDLCALC 55 08/30/2021   TRIG 103 08/30/2021   CHOLHDL 2.5 08/30/2021   Lab Results  Component Value Date   TSH 1.360 08/30/2021   Lab Results  Component Value Date   HGBA1C 7.1 11/16/2021   Lab Results  Component Value Date   WBC 12.4 (H) 08/30/2021   HGB 11.8 08/30/2021   HCT 36.5 08/30/2021   MCV 90 08/30/2021   PLT 356 08/30/2021   Lab Results  Component Value Date   ALT 24 08/30/2021   AST 18 08/30/2021   ALKPHOS 76 08/30/2021   BILITOT 0.5 08/30/2021   Lab Results  Component Value Date   VD25OH 38.6 08/30/2021     Review of Systems  Constitutional:  Negative for chills and fatigue.  Respiratory:  Negative for chest tightness and shortness of breath.   Cardiovascular:  Negative for chest pain.  Musculoskeletal:  Positive for arthralgias and joint swelling.    Patient Active Problem List   Diagnosis Date Noted   Impingement syndrome of shoulder, right 07/09/2021   Cervical paraspinal muscle spasm 07/09/2021   Degeneration of lumbar intervertebral disc 04/19/2021   Oropharyngeal dysphagia 02/23/2019   Neuropathy due to type 2 diabetes mellitus ([place]) 08/20/2018   Microalbuminuria 04/30/2017   CKD (chronic kidney disease) stage 3, GFR 30-59 ml/min (HCC) 04/30/2017   Type II  diabetes mellitus with complication ([place]) 19/41/7408   Essential hypertension 03/19/2017   CAD (coronary artery disease) 03/19/2017   History of kidney stones 03/19/2017   Psoriasis 03/19/2017   Hyperlipidemia associated with type 2 diabetes mellitus ([place]) 03/19/2017   Nocturnal leg cramps 03/19/2017   Gait instability 03/19/2017   Primary insomnia 03/19/2017   Allergic rhinitis 03/19/2017    Allergies  Allergen Reactions   Prednisone Other (See Comments)    Intolerance  - confusion     Past Surgical History:  Procedure Laterality Date   BREAST BIOPSY Right    neg   CATARACT EXTRACTION W/PHACO Left 09/27/2019   Procedure: CATARACT EXTRACTION PHACO AND INTRAOCULAR LENS PLACEMENT ([place]) LEFT DIABETIC ISTENT INJ INTRAVITREAL KENALOG INJ;  Surgeon: Eulogio Bear, MD;  Location: [facility];  Service: Ophthalmology;  Laterality: Left;  2.83 0:28.7   CATARACT EXTRACTION W/PHACO Right 12/13/2019   Procedure: CATARACT EXTRACTION PHACO AND INTRAOCULAR LENS PLACEMENT ([place]) RIGHT ISTENT INJ KENALOG INJ DIABETIC;  Surgeon: Eulogio Bear, MD;  Location: [facility];  Service: Ophthalmology;  Laterality: Right;  1.39 0:21.5   CESAREAN SECTION      Social History   Tobacco Use   Smoking status: Never   Smokeless tobacco: Never  Vaping Use   Vaping Use:  Never used  Substance Use Topics   Alcohol use: Never   Drug use: Never     Medication list has been reviewed and updated.  Current Meds  Medication Sig   ACCU-CHEK GUIDE test strip CHECK BLOOD SUGAR 3 TIMES DAILY   acetaminophen (TYLENOL) 500 MG tablet Take 1,000 mg by mouth every 8 (eight) hours as needed.   albuterol (VENTOLIN HFA) 108 (90 Base) MCG/ACT inhaler Inhale 2 puffs into the lungs every 6 (six) hours as needed for wheezing or shortness of breath.   Ascorbic Acid (VITAMIN C PO) Take by mouth.   aspirin EC 81 MG tablet Take 1 tablet (81 mg total) by mouth daily.   atorvastatin (LIPITOR) 80 MG  tablet TAKE 1 TABLET BY MOUTH ONCE  DAILY   BD INSULIN SYRINGE U/F 30G X 1/2" 0.5 ML MISC USE WITH LANTUS INJECTIONS  FOR TYPE 2 DIABETES  MELLITUS   blood glucose meter kit and supplies Dispense based on patient and insurance preference. Use up to four times daily as directed. (FOR ICD-10 E10.9, E11.9).   clobetasol (TEMOVATE) 0.05 % external solution Apply topically 2 (two) times daily. To scalp lesions   clobetasol ointment (TEMOVATE) 3.71 % Apply 1 application topically 2 (two) times daily.   DUPIXENT 300 MG/2ML SOPN Inject into the skin every 14 (fourteen) days.   gabapentin (NEURONTIN) 100 MG capsule TAKE 1 CAPSULE BY MOUTH 3 TIMES  DAILY   glimepiride (AMARYL) 2 MG tablet TAKE 1 TABLET BY MOUTH DAILY  WITH BREAKFAST   hydrocortisone 2.5 % ointment Apply topically 2 (two) times daily. To lesion on arm and buttocks   insulin glargine (LANTUS) 100 UNIT/ML injection Inject 0.4 mLs (40 Units total) into the skin every morning. Pt taking QAM (Patient taking differently: Inject 27 Units into the skin every morning. Pt taking QAM)   losartan (COZAAR) 50 MG tablet TAKE 1 TABLET BY MOUTH DAILY   meloxicam (MOBIC) 7.5 MG tablet Take 1 tablet (7.5 mg total) by mouth daily.   metFORMIN (GLUCOPHAGE) 1000 MG tablet TAKE 1 TABLET BY MOUTH TWICE  DAILY WITH MEALS   metoprolol tartrate (LOPRESSOR) 50 MG tablet TAKE 1 TABLET BY MOUTH TWICE  DAILY (Patient taking differently: No sig reported)   montelukast (SINGULAIR) 10 MG tablet TAKE 1 TABLET BY MOUTH DAILY   nystatin (MYCOSTATIN) 100000 UNIT/ML suspension 1 teaspoon  swish, gargle and spit four times per day.   Omega-3 Fatty Acids (FISH OIL) 1000 MG CAPS Take by mouth.   omeprazole (PRILOSEC) 20 MG capsule TAKE 1 CAPSULE BY MOUTH  DAILY   ONETOUCH DELICA PLUS LANCETS MISC 1 each by Does not apply route 2 (two) times daily.   Semaglutide, 1 MG/DOSE, 2 MG/1.5ML SOPN Inject into the skin. Inject 2 mg into the skin every 7 days   traZODone (DESYREL) 50 MG  tablet TAKE 1 TABLET BY MOUTH AT  BEDTIME   vitamin B-12 (CYANOCOBALAMIN) 1000 MCG tablet Take 1,000 mcg by mouth daily.       01/07/2022    4:07 PM 08/30/2021    8:55 AM 08/15/2021    3:43 PM 07/30/2021    2:50 PM  GAD 7 : Generalized Anxiety Score  Nervous, Anxious, on Edge 0 0 1 1  Control/stop worrying 0 0 1 1  Worry too much - different things 0 0 0 2  Trouble relaxing 0 0 0 2  Restless 0 0 2 1  Easily annoyed or irritable 0 0 0 1  Afraid - awful  might happen 0 0 0 0  Total GAD 7 Score 0 0 4 8  Anxiety Difficulty Not difficult at all Not difficult at all Very difficult        01/07/2022    4:07 PM 08/30/2021    8:54 AM 08/15/2021    3:43 PM  Depression screen PHQ 2/9  Decreased Interest _0 Down, Depressed, Hopeless _1 PHQ - 2 Score _2 Altered sleeping 1 0 2  Tired, decreased energy _3 Change in appetite _4 Feeling bad or failure about yourself  0 0 2  Trouble concentrating _5 Moving slowly or fidgety/restless 0 1 2  Suicidal thoughts 0 0 0  PHQ-9 Score _6 Difficult doing work/chores Not difficult at all Somewhat difficult Very difficult    BP Readings from Last 3 Encounters:  01/29/22 118/74  01/07/22 108/62  08/30/21 120/80    Physical Exam Vitals and nursing note reviewed.  Constitutional:      General: She is not in acute distress.    Appearance: She is well-developed.  HENT:     Head: Normocephalic and atraumatic.  Pulmonary:     Effort: Pulmonary effort is normal. No respiratory distress.  Musculoskeletal:       Legs:     Comments: Well circumscribed soft tissue swelling that is soft but tender to touch, minimally mobile c/w hematoma  Skin:    General: Skin is warm and dry.     Findings: No rash.  Neurological:     Mental Status: She is alert and oriented to person, place, and time.  Psychiatric:        Mood and Affect: Mood normal.        Behavior: Behavior normal.     Wt Readings from Last 3 Encounters:   01/29/22 138 lb (62.6 kg)  01/07/22 139 lb (63 kg)  08/30/21 137 lb (62.1 kg)    BP 118/74   Pulse 95   Ht _7  (1.499 m)   Wt 138 lb (62.6 kg)   SpO2 97%   BMI 27.87 kg/m   Assessment and Plan: 1. Contusion of right knee, initial encounter Recommend heat and elevation If no spontaneous resolution, return for follow up and imaging   Partially dictated using Dragon software. Any errors are unintentional.  Halina Maidens, MD [facility]  01/29/2022

## 2022-01-29 NOTE — Patient Instructions (Signed)
Warm compresses to the knee

## 2022-01-30 MED FILL — DUPIXENT 300 MG/2 ML SUBCUTANEOUS PEN INJECTOR: SUBCUTANEOUS | 28 days supply | Qty: 4 | Fill #8

## 2022-02-07 ENCOUNTER — Ambulatory Visit: Payer: Self-pay

## 2022-02-07 NOTE — Telephone Encounter (Signed)
Reason for Disposition  [1] MODERATE dizziness (e.g., interferes with normal activities) AND [2] has NOT been evaluated by doctor (or NP/PA) for this  (Exception: Dizziness caused by heat exposure, sudden standing, or poor fluid intake.)  Answer Assessment - Initial Assessment Questions 1. DESCRIPTION: "Describe your dizziness."     I'm having problems with dizziness.   When I wake up I'm dizzy.  During the night I'm not dizzy.     I would like to see the dr.    2. LIGHTHEADED: "Do you feel lightheaded?" (e.g., somewhat faint, woozy, weak upon standing)     I get dizzy when I wake up.    Once a year I get dizzy spells so they told me to wake up slowly.    I have diabetes.    3. VERTIGO: "Do you feel like either you or the room is spinning or tilting?" (i.e. vertigo)     No 4. SEVERITY: "How bad is it?"  "Do you feel like you are going to faint?" "Can you stand and walk?"   - MILD: Feels slightly dizzy, but walking normally.   - MODERATE: Feels unsteady when walking, but not falling; interferes with normal activities (e.g., school, work).   - SEVERE: Unable to walk without falling, or requires assistance to walk without falling; feels like passing out now.      I have to hold on to things when I'm walking to the bathroom. 5. ONSET:  "When did the dizziness begin?"     Last 4-5 days 6. AGGRAVATING FACTORS: "Does anything make it worse?" (e.g., standing, change in head position)     Moving around after laying down. 7. HEART RATE: "Can you tell me your heart rate?" "How many beats in 15 seconds?"  (Note: not all patients can do this)       Not asked 8. CAUSE: "What do you think is causing the dizziness?"     I don't know 9. RECURRENT SYMPTOM: "Have you had dizziness before?" If Yes, ask: "When was the last time?" "What happened that time?"     Yes I've had the dizziness before. 10. OTHER SYMPTOMS: "Do you have any other symptoms?" (e.g., fever, chest pain, vomiting, diarrhea, bleeding)       I  get headaches. 11. PREGNANCY: "Is there any chance you are pregnant?" "When was your last menstrual period?"       N/A due to age  Protocols used: Dizziness - Lightheadedness-A-AH  Chief Complaint: Dizziness and headaches Symptoms: Dizzy when first gets up in the morning.   Also holds onto things during the night to get to the bathroom because she is dizzy. Frequency: For the last 4 days Pertinent Negatives: Patient denies nausea with the dizziness.   Disposition: '[]'$ ED /'[]'$ Urgent Care (no appt availability in office) / '[x]'$ Appointment(In office/virtual)/ '[]'$  [facility]/ '[]'$ Home Care/ '[]'$ Refused Recommended Disposition /'[]'$ [facility]/ '[]'$  Follow-up with PCP Additional Notes: Appt made with Dr. Army Melia for 02/08/2022 at 2:40.  I instructed her to go to the ED if she passed out or the dizziness became worse.

## 2022-02-07 NOTE — Telephone Encounter (Signed)
Pt is calling to report that she was feeling dizzy last night and 4 days prior. Please advise   Left messages on both phone numbers in chart to call back about symptoms.

## 2022-02-08 ENCOUNTER — Ambulatory Visit (INDEPENDENT_AMBULATORY_CARE_PROVIDER_SITE_OTHER): Payer: Medicare Other | Admitting: Internal Medicine

## 2022-02-08 ENCOUNTER — Encounter: Payer: Self-pay | Admitting: Internal Medicine

## 2022-02-08 VITALS — BP 112/76 | HR 99 | Ht 59.0 in | Wt 139.0 lb

## 2022-02-08 DIAGNOSIS — E785 Hyperlipidemia, unspecified: Secondary | ICD-10-CM | POA: Diagnosis not present

## 2022-02-08 DIAGNOSIS — E1169 Type 2 diabetes mellitus with other specified complication: Secondary | ICD-10-CM

## 2022-02-08 DIAGNOSIS — R42 Dizziness and giddiness: Secondary | ICD-10-CM | POA: Diagnosis not present

## 2022-02-08 MED ORDER — MECLIZINE HCL 12.5 MG PO TABS: 12.5000 mg | ORAL_TABLET | Freq: Three times a day (TID) | ORAL | 0 refills | Status: DC | PRN

## 2022-02-08 NOTE — Progress Notes (Signed)
Date:  02/08/2022   Name:  Elizabeth Klein   DOB:  [birth year]   MRN:  [id number]   Chief Complaint: Dizziness and Medication Problem (Wants to take a lower dosage of statin medication )  Dizziness This is a new problem. Episode onset: X5 days. The problem occurs intermittently. The problem has been unchanged. Associated symptoms include vertigo. Pertinent negatives include no chest pain, chills, coughing, diaphoresis, fatigue, fever, nausea, neck pain, sore throat or vomiting. Nothing aggravates the symptoms. She has tried nothing for the symptoms. The treatment provided mild relief.  Hyperlipidemia This is a chronic problem. The problem is controlled. Pertinent negatives include no chest pain. Current antihyperlipidemic treatment includes statins (atorvastatin 80 mg).    Lab Results  Component Value Date   NA 140 08/30/2021   K 5.4 (H) 08/30/2021   CO2 21 08/30/2021   GLUCOSE 73 08/30/2021   BUN 18 08/30/2021   CREATININE 0.93 08/30/2021   CALCIUM 9.7 08/30/2021   EGFR 67 08/30/2021   GFRNONAA >60 04/20/2021   Lab Results  Component Value Date   CHOL 124 08/30/2021   HDL 50 08/30/2021   LDLCALC 55 08/30/2021   TRIG 103 08/30/2021   CHOLHDL 2.5 08/30/2021   Lab Results  Component Value Date   TSH 1.360 08/30/2021   Lab Results  Component Value Date   HGBA1C 7.1 11/16/2021   Lab Results  Component Value Date   WBC 12.4 (H) 08/30/2021   HGB 11.8 08/30/2021   HCT 36.5 08/30/2021   MCV 90 08/30/2021   PLT 356 08/30/2021   Lab Results  Component Value Date   ALT 24 08/30/2021   AST 18 08/30/2021   ALKPHOS 76 08/30/2021   BILITOT 0.5 08/30/2021   Lab Results  Component Value Date   VD25OH 38.6 08/30/2021     Review of Systems  Constitutional:  Negative for chills, diaphoresis, fatigue and fever.  HENT:  Negative for ear pain, sore throat and trouble swallowing.   Respiratory:  Negative for cough and chest tightness.   Cardiovascular:  Negative for  chest pain and palpitations.  Gastrointestinal:  Negative for nausea and vomiting.  Musculoskeletal:  Negative for neck pain.  Neurological:  Positive for dizziness and vertigo.  Psychiatric/Behavioral:  Negative for dysphoric mood and sleep disturbance. The patient is not nervous/anxious.     Patient Active Problem List   Diagnosis Date Noted   Impingement syndrome of shoulder, right 07/09/2021   Cervical paraspinal muscle spasm 07/09/2021   Degeneration of lumbar intervertebral disc 04/19/2021   Oropharyngeal dysphagia 02/23/2019   Neuropathy due to type 2 diabetes mellitus ([place]) 08/20/2018   Microalbuminuria 04/30/2017   CKD (chronic kidney disease) stage 3, GFR 30-59 ml/min ([place]) 04/30/2017   Type II diabetes mellitus with complication ([place]) 46/56/8127   Essential hypertension 03/19/2017   CAD (coronary artery disease) 03/19/2017   History of kidney stones 03/19/2017   Psoriasis 03/19/2017   Hyperlipidemia associated with type 2 diabetes mellitus ([place]) 03/19/2017   Nocturnal leg cramps 03/19/2017   Gait instability 03/19/2017   Primary insomnia 03/19/2017   Allergic rhinitis 03/19/2017    Allergies  Allergen Reactions   Prednisone Other (See Comments)    Intolerance  - confusion     Past Surgical History:  Procedure Laterality Date   BREAST BIOPSY Right    neg   CATARACT EXTRACTION W/PHACO Left 09/27/2019   Procedure: CATARACT EXTRACTION PHACO AND INTRAOCULAR LENS PLACEMENT ([place]) LEFT DIABETIC ISTENT INJ INTRAVITREAL KENALOG INJ;  Surgeon:  Eulogio Bear, MD;  Location: [facility];  Service: Ophthalmology;  Laterality: Left;  2.83 0:28.7   CATARACT EXTRACTION W/PHACO Right 12/13/2019   Procedure: CATARACT EXTRACTION PHACO AND INTRAOCULAR LENS PLACEMENT ([place]) RIGHT ISTENT INJ KENALOG INJ DIABETIC;  Surgeon: Eulogio Bear, MD;  Location: [facility];  Service: Ophthalmology;  Laterality: Right;  1.39 0:21.5   CESAREAN SECTION      Social  History   Tobacco Use   Smoking status: Never   Smokeless tobacco: Never  Vaping Use   Vaping Use: Never used  Substance Use Topics   Alcohol use: Never   Drug use: Never     Medication list has been reviewed and updated.  Current Meds  Medication Sig   ACCU-CHEK GUIDE test strip CHECK BLOOD SUGAR 3 TIMES DAILY   acetaminophen (TYLENOL) 500 MG tablet Take 1,000 mg by mouth every 8 (eight) hours as needed.   albuterol (VENTOLIN HFA) 108 (90 Base) MCG/ACT inhaler Inhale 2 puffs into the lungs every 6 (six) hours as needed for wheezing or shortness of breath.   Ascorbic Acid (VITAMIN C PO) Take by mouth.   aspirin EC 81 MG tablet Take 1 tablet (81 mg total) by mouth daily.   BD INSULIN SYRINGE U/F 30G X 1/2" 0.5 ML MISC USE WITH LANTUS INJECTIONS  FOR TYPE 2 DIABETES  MELLITUS   blood glucose meter kit and supplies Dispense based on patient and insurance preference. Use up to four times daily as directed. (FOR ICD-10 E10.9, E11.9).   clobetasol (TEMOVATE) 0.05 % external solution Apply topically 2 (two) times daily. To scalp lesions   clobetasol ointment (TEMOVATE) 1.02 % Apply 1 application topically 2 (two) times daily.   DUPIXENT 300 MG/2ML SOPN Inject into the skin every 14 (fourteen) days.   gabapentin (NEURONTIN) 100 MG capsule TAKE 1 CAPSULE BY MOUTH 3 TIMES  DAILY   glimepiride (AMARYL) 2 MG tablet TAKE 1 TABLET BY MOUTH DAILY  WITH BREAKFAST   hydrocortisone 2.5 % ointment Apply topically 2 (two) times daily. To lesion on arm and buttocks   insulin glargine (LANTUS) 100 UNIT/ML injection Inject 0.4 mLs (40 Units total) into the skin every morning. Pt taking QAM (Patient taking differently: Inject 27 Units into the skin every morning. Pt taking QAM)   losartan (COZAAR) 50 MG tablet TAKE 1 TABLET BY MOUTH DAILY   meloxicam (MOBIC) 7.5 MG tablet Take 1 tablet (7.5 mg total) by mouth daily.   metFORMIN (GLUCOPHAGE) 1000 MG tablet TAKE 1 TABLET BY MOUTH TWICE  DAILY WITH MEALS    metoprolol tartrate (LOPRESSOR) 50 MG tablet TAKE 1 TABLET BY MOUTH TWICE  DAILY (Patient taking differently: No sig reported)   montelukast (SINGULAIR) 10 MG tablet TAKE 1 TABLET BY MOUTH DAILY   mupirocin ointment (BACTROBAN) 2 % Apply 1 Application topically 3 (three) times daily.   nystatin (MYCOSTATIN) 100000 UNIT/ML suspension 1 teaspoon  swish, gargle and spit four times per day.   Omega-3 Fatty Acids (FISH OIL) 1000 MG CAPS Take by mouth.   omeprazole (PRILOSEC) 20 MG capsule TAKE 1 CAPSULE BY MOUTH  DAILY   ONETOUCH DELICA PLUS LANCETS MISC 1 each by Does not apply route 2 (two) times daily.   Semaglutide, 1 MG/DOSE, 2 MG/1.5ML SOPN Inject into the skin. Inject 2 mg into the skin every 7 days   traZODone (DESYREL) 50 MG tablet TAKE 1 TABLET BY MOUTH AT  BEDTIME   vitamin B-12 (CYANOCOBALAMIN) 1000 MCG tablet Take 1,000  mcg by mouth daily.       02/08/2022    2:31 PM 01/29/2022    2:28 PM 01/07/2022    4:07 PM 08/30/2021    8:55 AM  GAD 7 : Generalized Anxiety Score  Nervous, Anxious, on Edge 1 1 0 0  Control/stop worrying 1 1 0 0  Worry too much - different things 1 1 0 0  Trouble relaxing 1 1 0 0  Restless 1 1 0 0  Easily annoyed or irritable 1 1 0 0  Afraid - awful might happen 1 1 0 0  Total GAD 7 Score 7 7 0 0  Anxiety Difficulty Somewhat difficult Somewhat difficult Not difficult at all Not difficult at all       02/08/2022    2:31 PM 01/29/2022    2:27 PM 01/07/2022    4:07 PM  Depression screen PHQ 2/9  Decreased Interest _0 Down, Depressed, Hopeless 0 0 1  PHQ - 2 Score _1 Altered sleeping _2 Tired, decreased energy 0 0 1  Change in appetite 0 0 1  Feeling bad or failure about yourself  0 0 0  Trouble concentrating _3 Moving slowly or fidgety/restless 1 1 0  Suicidal thoughts 0 0 0  PHQ-9 Score _4 Difficult doing work/chores Somewhat difficult Somewhat difficult Not difficult at all    BP Readings from Last 3 Encounters:  02/08/22  112/76  01/29/22 118/74  01/07/22 108/62    Physical Exam Vitals and nursing note reviewed.  Constitutional:      General: She is not in acute distress.    Appearance: Normal appearance. She is well-developed.  HENT:     Head: Normocephalic and atraumatic.     Right Ear: Hearing and tympanic membrane normal.     Left Ear: Hearing and tympanic membrane normal.     Nose:     Right Sinus: No maxillary sinus tenderness.     Left Sinus: No maxillary sinus tenderness.  Eyes:     Extraocular Movements:     Right eye: Nystagmus present.     Left eye: Nystagmus present.     Comments: With lateral gaze - triggers mild vertigo  Cardiovascular:     Rate and Rhythm: Normal rate and regular rhythm.  Pulmonary:     Effort: Pulmonary effort is normal. No respiratory distress.     Breath sounds: No wheezing or rhonchi.  Musculoskeletal:     Cervical back: Normal range of motion.  Lymphadenopathy:     Cervical: No cervical adenopathy.  Skin:    General: Skin is warm and dry.     Findings: No rash.  Neurological:     Mental Status: She is alert and oriented to person, place, and time.  Psychiatric:        Mood and Affect: Mood normal.        Behavior: Behavior normal.     Wt Readings from Last 3 Encounters:  02/08/22 139 lb (63 kg)  01/29/22 138 lb (62.6 kg)  01/07/22 139 lb (63 kg)    BP 112/76   Pulse 99   Ht _5  (1.499 m)   Wt 139 lb (63 kg)   SpO2 95%   BMI 28.07 kg/m   Assessment and Plan: 1. Vertigo Likely viral; continue fluids, supportive care Can use meclizine if needed for severe sym - meclizine (ANTIVERT) 12.5 MG tablet; Take 1 tablet (12.5 mg  total) by mouth 3 (three) times daily as needed for dizziness.  Dispense: 20 tablet; Refill: 0  2. Hyperlipidemia associated with type 2 diabetes mellitus ([place]) Her sister is concerned that she is taking too high of a dose of atorvastatin. She has been on 80 mg since at least 2019. Her LDL is 55. I recommend no  change in dose at this time.   Partially dictated using Editor, commissioning. Any errors are unintentional.  Halina Maidens, MD [facility]  02/08/2022

## 2022-03-01 NOTE — Unmapped (Signed)
[facility] Clinical Intervention    Type of intervention: Medication on-hold    Medication involved: Dupixent    Problem identified: Patient will be traveling [place] and does not want medication sent to her until next month    Intervention performed: Discussed options of shipping it same day to have prior to her trip but she prefers to not bring medication with her.  She also doesn't want it kept in her house until she returns either for fear of a power outage, etc.  She will call us next month upon her return on 2/6.    Follow-up needed: Moving clinical call out one month ~2/7 to follow-up and resume medication    Approximate time spent: 5-10 minutes    Clinical evidence used to support intervention: Professional judgement    Result of the intervention:  Holding medication due to travel    Teofilo Pod, PharmD   [facility] Specialty Pharmacist

## 2022-03-05 ENCOUNTER — Encounter: Payer: Self-pay | Admitting: Internal Medicine

## 2022-03-05 ENCOUNTER — Ambulatory Visit (INDEPENDENT_AMBULATORY_CARE_PROVIDER_SITE_OTHER): Payer: Medicare Other | Admitting: Internal Medicine

## 2022-03-05 VITALS — BP 112/76 | HR 100 | Ht 59.0 in | Wt 138.0 lb

## 2022-03-05 DIAGNOSIS — E118 Type 2 diabetes mellitus with unspecified complications: Secondary | ICD-10-CM | POA: Diagnosis not present

## 2022-03-05 DIAGNOSIS — I10 Essential (primary) hypertension: Secondary | ICD-10-CM | POA: Diagnosis not present

## 2022-03-05 NOTE — Progress Notes (Signed)
Date:  03/05/2022   Name:  Elizabeth Klein   DOB:  08-Dec-1950   MRN:  [id number]   Chief Complaint: Hypertension and Diabetes  Hypertension This is a chronic problem. The problem is controlled. Pertinent negatives include no chest pain, headaches, palpitations or shortness of breath. Past treatments include beta blockers and angiotensin blockers.  Diabetes She presents for her follow-up diabetic visit. She has type 2 diabetes mellitus. Pertinent negatives for hypoglycemia include no dizziness, headaches or nervousness/anxiousness. Pertinent negatives for diabetes include no chest pain, no fatigue and no weakness. Current diabetic treatments: metformin, glimepiride, Lantus, Ozempic.    Lab Results  Component Value Date   NA 140 08/30/2021   K 5.4 (H) 08/30/2021   CO2 21 08/30/2021   GLUCOSE 73 08/30/2021   BUN 18 08/30/2021   CREATININE 0.93 08/30/2021   CALCIUM 9.7 08/30/2021   EGFR 67 08/30/2021   GFRNONAA >60 04/20/2021   Lab Results  Component Value Date   CHOL 124 08/30/2021   HDL 50 08/30/2021   LDLCALC 55 08/30/2021   TRIG 103 08/30/2021   CHOLHDL 2.5 08/30/2021   Lab Results  Component Value Date   TSH 1.360 08/30/2021   Lab Results  Component Value Date   HGBA1C 7.1 11/16/2021   Lab Results  Component Value Date   WBC 12.4 (H) 08/30/2021   HGB 11.8 08/30/2021   HCT 36.5 08/30/2021   MCV 90 08/30/2021   PLT 356 08/30/2021   Lab Results  Component Value Date   ALT 24 08/30/2021   AST 18 08/30/2021   ALKPHOS 76 08/30/2021   BILITOT 0.5 08/30/2021   Lab Results  Component Value Date   VD25OH 38.6 08/30/2021     Review of Systems  Constitutional:  Negative for fatigue and unexpected weight change.  HENT:  Negative for nosebleeds.   Eyes:  Negative for visual disturbance.  Respiratory:  Negative for cough, chest tightness, shortness of breath and wheezing.   Cardiovascular:  Negative for chest pain, palpitations and leg swelling.   Gastrointestinal:  Negative for abdominal pain, constipation and diarrhea.  Neurological:  Positive for light-headedness (mild symptoms intermittently). Negative for dizziness, weakness and headaches.  Psychiatric/Behavioral:  Negative for dysphoric mood and sleep disturbance. The patient is not nervous/anxious.     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   Microalbuminuria 04/30/2017   CKD (chronic kidney disease) stage 3, GFR 30-59 ml/min (HCC) 04/30/2017   Type II diabetes mellitus with complication ([place]) 47/65/4650   Essential hypertension 03/19/2017   CAD (coronary artery disease) 03/19/2017   History of kidney stones 03/19/2017   Psoriasis 03/19/2017   Hyperlipidemia associated with type 2 diabetes mellitus ([place]) 03/19/2017   Nocturnal leg cramps 03/19/2017   Gait instability 03/19/2017   Primary insomnia 03/19/2017   Allergic rhinitis 03/19/2017    Allergies  Allergen Reactions   Prednisone Other (See Comments)    Intolerance  - confusion     Past Surgical History:  Procedure Laterality Date   BREAST BIOPSY Right    neg   CATARACT EXTRACTION W/PHACO Left 09/27/2019   Procedure: CATARACT EXTRACTION PHACO AND INTRAOCULAR LENS PLACEMENT ([place]) LEFT DIABETIC ISTENT INJ INTRAVITREAL KENALOG INJ;  Surgeon: Eulogio Bear, MD;  Location: [facility];  Service: Ophthalmology;  Laterality: Left;  2.83 0:28.7   CATARACT EXTRACTION W/PHACO Right 12/13/2019   Procedure: CATARACT EXTRACTION PHACO AND INTRAOCULAR LENS PLACEMENT ([place]) RIGHT ISTENT INJ KENALOG INJ DIABETIC;  Surgeon: Eulogio Bear, MD;  Location: [facility];  Service: Ophthalmology;  Laterality: Right;  1.39 0:21.5   CESAREAN  SECTION      Social History   Tobacco Use   Smoking status: Never   Smokeless tobacco: Never  Vaping Use   Vaping Use: Never used  Substance Use Topics   Alcohol use: Never   Drug use: Never     Medication list has been reviewed and updated.  Current Meds  Medication Sig   ACCU-CHEK GUIDE test strip CHECK BLOOD SUGAR 3 TIMES DAILY   acetaminophen (TYLENOL) 500 MG tablet Take 1,000 mg by mouth every 8 (eight) hours as needed.   albuterol (VENTOLIN HFA) 108 (90 Base) MCG/ACT inhaler Inhale 2 puffs into the lungs every 6 (six) hours as needed for wheezing or shortness of breath.   Ascorbic Acid (VITAMIN C PO) Take by mouth.   aspirin EC 81 MG tablet Take 1 tablet (81 mg total) by mouth daily.   atorvastatin (LIPITOR) 80 MG tablet TAKE 1 TABLET BY MOUTH ONCE  DAILY   BD INSULIN SYRINGE U/F 30G X 1/2" 0.5 ML MISC USE WITH LANTUS INJECTIONS  FOR TYPE 2 DIABETES  MELLITUS   blood glucose meter kit and supplies Dispense based on patient and insurance preference. Use up to four times daily as directed. (FOR ICD-10 E10.9, E11.9).   clobetasol (TEMOVATE) 0.05 % external solution Apply topically 2 (two) times daily. To scalp lesions   clobetasol ointment (TEMOVATE) 2.63 % Apply 1 application topically 2 (two) times daily.   DUPIXENT 300 MG/2ML SOPN Inject into the skin every 14 (fourteen) days.   gabapentin (NEURONTIN) 100 MG capsule TAKE 1 CAPSULE BY MOUTH 3 TIMES  DAILY   glimepiride (AMARYL) 2 MG tablet TAKE 1 TABLET BY MOUTH DAILY  WITH BREAKFAST   hydrocortisone 2.5 % ointment Apply topically 2 (two) times daily. To lesion on arm and buttocks   insulin glargine (LANTUS) 100 UNIT/ML injection Inject 0.4 mLs (40 Units total) into the skin every morning. Pt taking QAM (Patient taking differently: Inject 27 Units into the skin every morning. Pt taking QAM)   losartan (COZAAR) 50 MG tablet TAKE 1 TABLET BY MOUTH DAILY   meclizine (ANTIVERT) 12.5 MG tablet Take 1 tablet (12.5 mg total) by  mouth 3 (three) times daily as needed for dizziness.   meloxicam (MOBIC) 7.5 MG tablet Take 1 tablet (7.5 mg total) by mouth daily.   metFORMIN (GLUCOPHAGE) 1000 MG tablet TAKE 1 TABLET BY MOUTH TWICE  DAILY WITH MEALS   metoprolol tartrate (LOPRESSOR) 50 MG tablet TAKE 1 TABLET BY MOUTH TWICE  DAILY   montelukast (SINGULAIR) 10 MG tablet TAKE 1 TABLET BY MOUTH DAILY   mupirocin ointment (BACTROBAN) 2 % Apply 1 Application topically 3 (three) times daily.   nystatin (MYCOSTATIN) 100000 UNIT/ML suspension 1 teaspoon  swish, gargle and spit four times per day.   Omega-3 Fatty Acids (FISH OIL) 1000 MG CAPS Take by mouth.   omeprazole (PRILOSEC) 20 MG capsule TAKE 1 CAPSULE BY MOUTH  DAILY   ONETOUCH DELICA PLUS LANCETS MISC 1 each by Does not apply route 2 (two) times daily.   OZEMPIC, 2 MG/DOSE, 8 MG/3ML SOPN Inject 2 mg into the skin once a week.   traZODone (DESYREL) 50 MG tablet  TAKE 1 TABLET BY MOUTH AT  BEDTIME   vitamin B-12 (CYANOCOBALAMIN) 1000 MCG tablet Take 1,000 mcg by mouth daily.       03/05/2022    8:32 AM 02/08/2022    2:31 PM 01/29/2022    2:28 PM 01/07/2022    4:07 PM  GAD 7 : Generalized Anxiety Score  Nervous, Anxious, on Edge '1 1 1 '$ 0  Control/stop worrying 0 1 1 0  Worry too much - different things '1 1 1 '$ 0  Trouble relaxing '1 1 1 '$ 0  Restless '1 1 1 '$ 0  Easily annoyed or irritable '1 1 1 '$ 0  Afraid - awful might happen '1 1 1 '$ 0  Total GAD 7 Score '6 7 7 '$ 0  Anxiety Difficulty Somewhat difficult Somewhat difficult Somewhat difficult Not difficult at all       03/05/2022    8:32 AM 02/08/2022    2:31 PM 01/29/2022    2:27 PM  Depression screen PHQ 2/9  Decreased Interest '1 1 1  '$ Down, Depressed, Hopeless 0 0 0  PHQ - 2 Score '1 1 1  '$ Altered sleeping 0 1 1  Tired, decreased energy 1 0 0  Change in appetite 1 0 0  Feeling bad or failure about yourself  1 0 0  Trouble concentrating '1 1 1  '$ Moving slowly or fidgety/restless '1 1 1  '$ Suicidal thoughts 0 0 0  PHQ-9 Score '6 4  4  '$ Difficult doing work/chores Somewhat difficult Somewhat difficult Somewhat difficult    BP Readings from Last 3 Encounters:  03/05/22 112/76  02/08/22 112/76  01/29/22 118/74    Physical Exam Vitals and nursing note reviewed.  Constitutional:      General: She is not in acute distress.    Appearance: She is well-developed.  HENT:     Head: Normocephalic and atraumatic.  Cardiovascular:     Rate and Rhythm: Normal rate and regular rhythm.  Pulmonary:     Effort: Pulmonary effort is normal. No respiratory distress.     Breath sounds: No wheezing or rhonchi.  Musculoskeletal:     Right lower leg: No edema.     Left lower leg: No edema.  Lymphadenopathy:     Cervical: No cervical adenopathy.  Skin:    General: Skin is warm and dry.     Capillary Refill: Capillary refill takes less than 2 seconds.     Findings: No rash.  Neurological:     General: No focal deficit present.     Mental Status: She is alert and oriented to person, place, and time.  Psychiatric:        Mood and Affect: Mood normal.        Behavior: Behavior normal.     Wt Readings from Last 3 Encounters:  03/05/22 138 lb (62.6 kg)  02/08/22 139 lb (63 kg)  01/29/22 138 lb (62.6 kg)    BP 112/76   Pulse 100   Ht '4\' 11"'$  (1.499 m)   Wt 138 lb (62.6 kg)   SpO2 97%   BMI 27.87 kg/m   Assessment and Plan: Problem List Items Addressed This Visit       Cardiovascular and Mediastinum   Essential hypertension - Primary (Chronic)    Clinically stable exam with well controlled BP on losartan and metoprolol Tolerating medications without side effects at this time. Pt to continue current regimen and low sodium diet; benefits of regular exercise as able discussed.       Relevant Orders  Basic metabolic panel     Endocrine   Type II diabetes mellitus with complication (HCC) (Chronic)    BS well controlled on current regimen - ozempic, lantus, metformin, glimepride Followed by Endo Dr. Honor Junes Per  insurance, Ozempic will now need a PA      Relevant Medications   OZEMPIC, 2 MG/DOSE, 8 MG/3ML SOPN     Partially dictated using Editor, commissioning. Any errors are unintentional.  Halina Maidens, MD [facility]  03/05/2022

## 2022-03-05 NOTE — Assessment & Plan Note (Addendum)
BS well controlled on current regimen - ozempic, lantus, metformin, glimepride Followed by Endo Dr. Honor Junes Per insurance, Ozempic will now need a PA

## 2022-03-05 NOTE — Assessment & Plan Note (Signed)
Clinically stable exam with well controlled BP on losartan and metoprolol Tolerating medications without side effects at this time. Pt to continue current regimen and low sodium diet; benefits of regular exercise as able discussed.

## 2022-03-06 ENCOUNTER — Other Ambulatory Visit: Payer: Self-pay

## 2022-03-06 ENCOUNTER — Telehealth: Payer: Self-pay | Admitting: *Deleted

## 2022-03-06 ENCOUNTER — Telehealth: Payer: Self-pay | Admitting: Internal Medicine

## 2022-03-06 ENCOUNTER — Other Ambulatory Visit
Admission: RE | Admit: 2022-03-06 | Discharge: 2022-03-06 | Disposition: A | Discharge status: Home / Self Care | Payer: Medicare Other | Attending: Internal Medicine | Admitting: Internal Medicine

## 2022-03-06 DIAGNOSIS — E875 Hyperkalemia: Secondary | ICD-10-CM | POA: Diagnosis not present

## 2022-03-06 LAB — BASIC METABOLIC PANEL
Anion gap: 7 (ref 5–15)
BUN/Creatinine Ratio: 23 (ref 12–28)
BUN: 27 mg/dL (ref 8–27)
BUN: 22 mg/dL (ref 8–23)
CO2: 18 mmol/L — ABNORMAL LOW (ref 20–29)
CO2: 20 mmol/L — ABNORMAL LOW (ref 22–32)
Calcium: 9.8 mg/dL (ref 8.7–10.3)
Calcium: 9 mg/dL (ref 8.9–10.3)
Chloride: 107 mmol/L — ABNORMAL HIGH (ref 96–106)
Chloride: 105 mmol/L (ref 98–111)
Creatinine, Ser: 1.19 mg/dL — ABNORMAL HIGH (ref 0.57–1.00)
Creatinine, Ser: 0.95 mg/dL (ref 0.44–1.00)
GFR, Estimated: >60 mL/min (ref >60)
Glucose, Bld: 91 mg/dL (ref 70–99)
Glucose: 134 mg/dL — ABNORMAL HIGH (ref 70–99)
Potassium: 6.6 mmol/L (ref 3.5–5.2)
Potassium: 5.7 mmol/L — ABNORMAL HIGH (ref 3.5–5.1)
Sodium: 140 mmol/L (ref 134–144)
Sodium: 132 mmol/L — ABNORMAL LOW (ref 135–145)
eGFR: 49 mL/min/{1.73_m2} — ABNORMAL LOW (ref >59)

## 2022-03-06 NOTE — Telephone Encounter (Signed)
Told pt that we will see what her repeat labs say and go from there. Pt verbalized understanding.  KP

## 2022-03-06 NOTE — Telephone Encounter (Signed)
Patient is wanting to let her PCP know that she has been eating avocados and bananas and is wanting to know if this would cause her levels to go up?  Per patient she normally eat bananas when they are not ripe and the last bananas that she  had have gotten ripe and she knows when they get ripe then it has a lot of sugar in it.   Please advise

## 2022-03-06 NOTE — Telephone Encounter (Signed)
Pt. Returned call and was given the lab result message from Dr. Army Melia dated 03/06/2022 at 7:56 AM.    Pt is for a BMP this afternoon but the order had not been entered.   I called the practice and spoke with Levada Dy letting her know the BMP needed to be entered.

## 2022-03-14 ENCOUNTER — Other Ambulatory Visit: Payer: Self-pay | Admitting: Internal Medicine

## 2022-03-14 DIAGNOSIS — E118 Type 2 diabetes mellitus with unspecified complications: Secondary | ICD-10-CM

## 2022-03-14 NOTE — Telephone Encounter (Signed)
Future visit in 5 months. Requested Prescriptions  Pending Prescriptions Disp Refills   metFORMIN (GLUCOPHAGE) 1000 MG tablet [Pharmacy Med Name: metFORMIN HCl 1000 MG Oral Tablet] 200 tablet 1    Sig: TAKE 1 TABLET BY MOUTH TWICE  DAILY WITH MEALS     Endocrinology:  Diabetes - Biguanides Passed - 03/14/2022  4:19 AM      Passed - Cr in normal range and within 360 days    Creatinine, Ser  Date Value Ref Range Status  03/06/2022 0.95 0.44 - 1.00 mg/dL Final         Passed - HBA1C is between 0 and 7.9 and within 180 days    Hemoglobin A1C  Date Value Ref Range Status  11/16/2021 7.1  Final         Passed - eGFR in normal range and within 360 days    GFR calc Af Amer  Date Value Ref Range Status  08/23/2019 >60 >60 mL/min Final   GFR, Estimated  Date Value Ref Range Status  03/06/2022 >60 >60 mL/min Final    Comment:    (NOTE) Calculated using the CKD-EPI Creatinine Equation (2021)    eGFR  Date Value Ref Range Status  03/05/2022 49 (L) >59 mL/min/1.73 Final         Passed - B12 Level in normal range and within 720 days    Vitamin B-12  Date Value Ref Range Status  11/16/2021 378  Final         Passed - Valid encounter within last 6 months    Recent Outpatient Visits           1 week ago Essential hypertension   [facility] at [facility], Jesse Sans, MD   1 month ago Vertigo   [facility] at [facility], Jesse Sans, MD   1 month ago Contusion of right knee, initial encounter   [facility] [facility] at [facility], Jesse Sans, MD   2 months ago Episodic tension-type headache, not intractable   [place] [facility] at [facility], Jesse Sans, MD   6 months ago Annual physical exam   [facility] at [facility], Jesse Sans, MD       Future Appointments              In 5 months Glean Hess, MD [facility] at [facility], [place] within normal limits and completed in the last 12 months    WBC  Date Value Ref Range Status  08/30/2021 12.4 (H) 3.4 - 10.8 x10E3/uL Final  04/20/2021 14.1 (H) 4.0 - 10.5 K/uL Final   RBC  Date Value Ref Range Status  08/30/2021 4.04 3.77 - 5.28 x10E6/uL Final  04/20/2021 4.22 3.87 - 5.11 MIL/uL Final   Hemoglobin  Date Value Ref Range Status  08/30/2021 11.8 11.1 - 15.9 g/dL Final   Hematocrit  Date Value Ref Range Status  08/30/2021 36.5 34.0 - 46.6 % Final   MCHC  Date Value Ref Range Status  08/30/2021 32.3 31.5 - 35.7 g/dL Final  04/20/2021 31.2 30.0 - 36.0 g/dL Final   [facility]  Date Value Ref Range Status  08/30/2021 29.2 26.6 - 33.0 pg Final  04/20/2021 28.2 26.0 - 34.0  pg Final   MCV  Date Value Ref Range Status  08/30/2021 90 79 - 97 fL Final   No results found for: "PLTCOUNTKUC", "LABPLAT", "POCPLA" RDW  Date Value Ref Range Status  08/30/2021 13.4 11.7 - 15.4 % Final

## 2022-04-01 ENCOUNTER — Ambulatory Visit: Payer: Self-pay | Admitting: *Deleted

## 2022-04-01 NOTE — Telephone Encounter (Signed)
Summary: cough, cold, congestion   Patient states that she is having a bad cold, cough, congestion, loss of sleep. Per patient she is wanting some advise what patient can do.  Her PCP does not have an appointment for this week and she does not want to see another doctor.         Reason for Disposition  [1] Continuous (nonstop) coughing interferes with work or school AND [2] no improvement using cough treatment per Care Advice  Answer Assessment - Initial Assessment Questions 1. ONSET: "When did the cough begin?"      2 days 2. SEVERITY: "How bad is the cough today?"      Constant- keeping up at night 3. SPUTUM: "Describe the color of your sputum" (none, dry cough; clear, white, yellow, green)     No color 4. HEMOPTYSIS: "Are you coughing up any blood?" If so ask: "How much?" (flecks, streaks, tablespoons, etc.)     no 5. DIFFICULTY BREATHING: "Are you having difficulty breathing?" If Yes, ask: "How bad is it?" (e.g., mild, moderate, severe)    - MILD: No SOB at rest, mild SOB with walking, speaks normally in sentences, can lie down, no retractions, pulse < 100.    - MODERATE: SOB at rest, SOB with minimal exertion and prefers to sit, cannot lie down flat, speaks in phrases, mild retractions, audible wheezing, pulse 100-120.    - SEVERE: Very SOB at rest, speaks in single words, struggling to breathe, sitting hunched forward, retractions, pulse > 120      Patient is using OTC allergy medication  6. FEVER: "Do you have a fever?" If Yes, ask: "What is your temperature, how was it measured, and when did it start?"     Not checked 7. CARDIAC HISTORY: "Do you have any history of heart disease?" (e.g., heart attack, congestive heart failure)      CAD 8. LUNG HISTORY: "Do you have any history of lung disease?"  (e.g., pulmonary embolus, asthma, emphysema)     no 9. PE RISK FACTORS: "Do you have a history of blood clots?" (or: recent major surgery, recent prolonged travel, bedridden)     no 10.  OTHER SYMPTOMS: "Do you have any other symptoms?" (e.g., runny nose, wheezing, chest pain)       Runny nose  Protocols used: Cough - Acute Productive-A-AH

## 2022-04-01 NOTE — Telephone Encounter (Signed)
  Chief Complaint: cough, congestion Symptoms: constant cough, congestion  Frequency: 2 days Pertinent Negatives: Patient denies fever, SOB Disposition: '[]'$ ED /'[]'$ Urgent Care (no appt availability in office) / '[x]'$ Appointment(In office/virtual)/ '[]'$  [facility]/ '[]'$ Home Care/ '[]'$ Refused Recommended Disposition /'[]'$ [facility]/ '[]'$  Follow-up with PCP Additional Notes: Offered appointment with other provider in office- only wants to see PCP- next day appointment scheduled- office will call if someone cancels.

## 2022-04-02 ENCOUNTER — Encounter: Payer: Self-pay | Admitting: Internal Medicine

## 2022-04-02 ENCOUNTER — Ambulatory Visit (INDEPENDENT_AMBULATORY_CARE_PROVIDER_SITE_OTHER): Payer: Medicare Other | Admitting: Internal Medicine

## 2022-04-02 VITALS — BP 90/54 | HR 108 | Ht 59.0 in | Wt 134.0 lb

## 2022-04-02 DIAGNOSIS — J069 Acute upper respiratory infection, unspecified: Secondary | ICD-10-CM

## 2022-04-02 MED ORDER — AZITHROMYCIN 250 MG PO TABS: ORAL_TABLET | ORAL | 0 refills | Status: AC

## 2022-04-02 MED ORDER — PROMETHAZINE-DM 6.25-15 MG/5ML PO SYRP: 5.0000 mL | ORAL_SOLUTION | Freq: Four times a day (QID) | ORAL | 0 refills | Status: AC | PRN

## 2022-04-02 NOTE — Progress Notes (Signed)
Date:  04/02/2022   Name:  Elizabeth Klein   DOB:  1950-09-28   MRN:  [id number]   Chief Complaint: Cough (Started 2-3 days ago. Tested for Covid and it was negative. Cough- dry. Sinus pressure and congestion.)  Sinus Problem This is a new problem. Episode onset: three days ago. There has been no fever. She is experiencing no pain. Associated symptoms include congestion, coughing, headaches and sinus pressure. Pertinent negatives include no chills, ear pain, shortness of breath or sore throat. Past treatments include acetaminophen.  She just returned 4 days ago from [place].  She did not do a home covid test.    Lab Results  Component Value Date   NA 132 (L) 03/06/2022   K 5.7 (H) 03/06/2022   CO2 20 (L) 03/06/2022   GLUCOSE 91 03/06/2022   BUN 22 03/06/2022   CREATININE 0.95 03/06/2022   CALCIUM 9.0 03/06/2022   EGFR 49 (L) 03/05/2022   GFRNONAA >60 03/06/2022   Lab Results  Component Value Date   CHOL 124 08/30/2021   HDL 50 08/30/2021   LDLCALC 55 08/30/2021   TRIG 103 08/30/2021   CHOLHDL 2.5 08/30/2021   Lab Results  Component Value Date   TSH 1.360 08/30/2021   Lab Results  Component Value Date   HGBA1C 7.1 11/16/2021   Lab Results  Component Value Date   WBC 12.4 (H) 08/30/2021   HGB 11.8 08/30/2021   HCT 36.5 08/30/2021   MCV 90 08/30/2021   PLT 356 08/30/2021   Lab Results  Component Value Date   ALT 24 08/30/2021   AST 18 08/30/2021   ALKPHOS 76 08/30/2021   BILITOT 0.5 08/30/2021   Lab Results  Component Value Date   VD25OH 38.6 08/30/2021     Review of Systems  Constitutional:  Negative for chills, fatigue and fever.  HENT:  Positive for congestion and sinus pressure. Negative for ear pain and sore throat.   Eyes:  Negative for visual disturbance.  Respiratory:  Positive for cough. Negative for chest tightness, shortness of breath and wheezing.   Gastrointestinal:  Negative for nausea and vomiting.  Neurological:  Positive for  headaches. Negative for dizziness.  Psychiatric/Behavioral:  Positive for sleep disturbance. Negative for dysphoric mood. The patient is not nervous/anxious.     Patient Active Problem List   Diagnosis Date Noted   Impingement syndrome of shoulder, right 07/09/2021   Cervical paraspinal muscle spasm 07/09/2021   Degeneration of lumbar intervertebral disc 04/19/2021   Oropharyngeal dysphagia 02/23/2019   Neuropathy due to type 2 diabetes mellitus ([place]) 08/20/2018   Microalbuminuria 04/30/2017   CKD (chronic kidney disease) stage 3, GFR 30-59 ml/min (HCC) 04/30/2017   Type II diabetes mellitus with complication ([place]) 33/35/4562   Essential hypertension 03/19/2017   CAD (coronary artery disease) 03/19/2017   History of kidney stones 03/19/2017   Psoriasis 03/19/2017   Hyperlipidemia associated with type 2 diabetes mellitus ([place]) 03/19/2017   Nocturnal leg cramps 03/19/2017   Gait instability 03/19/2017   Primary insomnia 03/19/2017   Allergic rhinitis 03/19/2017    Allergies  Allergen Reactions   Prednisone Other (See Comments)    Intolerance  - confusion     Past Surgical History:  Procedure Laterality Date   BREAST BIOPSY Right    neg   CATARACT EXTRACTION W/PHACO Left 09/27/2019   Procedure: CATARACT EXTRACTION PHACO AND INTRAOCULAR LENS PLACEMENT ([place]) LEFT DIABETIC ISTENT INJ INTRAVITREAL KENALOG INJ;  Surgeon: Eulogio Bear, MD;  Location: [facility];  Service: Ophthalmology;  Laterality: Left;  2.83 0:28.7   CATARACT EXTRACTION W/PHACO Right 12/13/2019   Procedure: CATARACT EXTRACTION PHACO AND INTRAOCULAR LENS PLACEMENT ([place]) RIGHT ISTENT INJ KENALOG INJ DIABETIC;  Surgeon: Eulogio Bear, MD;  Location: [place];  Service: Ophthalmology;  Laterality: Right;  1.39 0:21.5   CESAREAN SECTION      Social History   Tobacco Use   Smoking status: Never   Smokeless tobacco: Never  Vaping Use   Vaping Use: Never used  Substance Use Topics    Alcohol use: Never   Drug use: Never     Medication list has been reviewed and updated.  Current Meds  Medication Sig   ACCU-CHEK GUIDE test strip CHECK BLOOD SUGAR 3 TIMES DAILY   acetaminophen (TYLENOL) 500 MG tablet Take 1,000 mg by mouth every 8 (eight) hours as needed.   albuterol (VENTOLIN HFA) 108 (90 Base) MCG/ACT inhaler Inhale 2 puffs into the lungs every 6 (six) hours as needed for wheezing or shortness of breath.   Ascorbic Acid (VITAMIN C PO) Take by mouth.   aspirin EC 81 MG tablet Take 1 tablet (81 mg total) by mouth daily.   atorvastatin (LIPITOR) 80 MG tablet TAKE 1 TABLET BY MOUTH ONCE  DAILY   BD INSULIN SYRINGE U/F 30G X 1/2" 0.5 ML MISC USE WITH LANTUS INJECTIONS  FOR TYPE 2 DIABETES  MELLITUS   blood glucose meter kit and supplies Dispense based on patient and insurance preference. Use up to four times daily as directed. (FOR ICD-10 E10.9, E11.9).   clobetasol (TEMOVATE) 0.05 % external solution Apply topically 2 (two) times daily. To scalp lesions   clobetasol ointment (TEMOVATE) 3.97 % Apply 1 application topically 2 (two) times daily.   DUPIXENT 300 MG/2ML SOPN Inject into the skin every 14 (fourteen) days.   gabapentin (NEURONTIN) 100 MG capsule TAKE 1 CAPSULE BY MOUTH 3 TIMES  DAILY   glimepiride (AMARYL) 2 MG tablet TAKE 1 TABLET BY MOUTH DAILY  WITH BREAKFAST   hydrocortisone 2.5 % ointment Apply topically 2 (two) times daily. To lesion on arm and buttocks   insulin glargine (LANTUS) 100 UNIT/ML injection Inject 0.4 mLs (40 Units total) into the skin every morning. Pt taking QAM (Patient taking differently: Inject 27 Units into the skin every morning. Pt taking QAM)   losartan (COZAAR) 50 MG tablet TAKE 1 TABLET BY MOUTH DAILY   meclizine (ANTIVERT) 12.5 MG tablet Take 1 tablet (12.5 mg total) by mouth 3 (three) times daily as needed for dizziness.   meloxicam (MOBIC) 7.5 MG tablet Take 1 tablet (7.5 mg total) by mouth daily.   metFORMIN (GLUCOPHAGE) 1000 MG  tablet TAKE 1 TABLET BY MOUTH TWICE  DAILY WITH MEALS   metoprolol tartrate (LOPRESSOR) 50 MG tablet TAKE 1 TABLET BY MOUTH TWICE  DAILY   montelukast (SINGULAIR) 10 MG tablet TAKE 1 TABLET BY MOUTH DAILY   mupirocin ointment (BACTROBAN) 2 % Apply 1 Application topically 3 (three) times daily.   nystatin (MYCOSTATIN) 100000 UNIT/ML suspension 1 teaspoon  swish, gargle and spit four times per day.   Omega-3 Fatty Acids (FISH OIL) 1000 MG CAPS Take by mouth.   omeprazole (PRILOSEC) 20 MG capsule TAKE 1 CAPSULE BY MOUTH  DAILY   ONETOUCH DELICA PLUS LANCETS MISC 1 each by Does not apply route 2 (two) times daily.   OZEMPIC, 2 MG/DOSE, 8 MG/3ML SOPN Inject 2 mg into the skin once a week.   traZODone (DESYREL)  50 MG tablet TAKE 1 TABLET BY MOUTH AT  BEDTIME   vitamin B-12 (CYANOCOBALAMIN) 1000 MCG tablet Take 1,000 mcg by mouth daily.       04/02/2022    9:04 AM 03/05/2022    8:32 AM 02/08/2022    2:31 PM 01/29/2022    2:28 PM  GAD 7 : Generalized Anxiety Score  Nervous, Anxious, on Edge '1 1 1 1  '$ Control/stop worrying 0 0 1 1  Worry too much - different things '1 1 1 1  '$ Trouble relaxing '1 1 1 1  '$ Restless '1 1 1 1  '$ Easily annoyed or irritable 0 '1 1 1  '$ Afraid - awful might happen 0 '1 1 1  '$ Total GAD 7 Score '4 6 7 7  '$ Anxiety Difficulty Not difficult at all Somewhat difficult Somewhat difficult Somewhat difficult       04/02/2022    9:04 AM 03/05/2022    8:32 AM 02/08/2022    2:31 PM  Depression screen PHQ 2/9  Decreased Interest 0 1 1  Down, Depressed, Hopeless 0 0 0  PHQ - 2 Score 0 1 1  Altered sleeping 1 0 1  Tired, decreased energy 1 1 0  Change in appetite 1 1 0  Feeling bad or failure about yourself  0 1 0  Trouble concentrating 0 1 1  Moving slowly or fidgety/restless '1 1 1  '$ Suicidal thoughts 0 0 0  PHQ-9 Score '4 6 4  '$ Difficult doing work/chores Not difficult at all Somewhat difficult Somewhat difficult    BP Readings from Last 3 Encounters:  04/02/22 (!) 90/54  03/05/22  112/76  02/08/22 112/76    Physical Exam Constitutional:      Appearance: Normal appearance.  HENT:     Right Ear: Tympanic membrane is retracted.     Left Ear: Tympanic membrane is retracted.     Nose:     Right Sinus: Maxillary sinus tenderness present. No frontal sinus tenderness.     Left Sinus: Maxillary sinus tenderness present. No frontal sinus tenderness.  Cardiovascular:     Rate and Rhythm: Normal rate and regular rhythm.  Pulmonary:     Effort: Pulmonary effort is normal.     Breath sounds: No wheezing or rhonchi.  Musculoskeletal:     Cervical back: Normal range of motion.  Lymphadenopathy:     Cervical: No cervical adenopathy.  Skin:    General: Skin is warm and dry.  Neurological:     Mental Status: She is alert.     Wt Readings from Last 3 Encounters:  04/02/22 134 lb (60.8 kg)  03/05/22 138 lb (62.6 kg)  02/08/22 139 lb (63 kg)    BP (!) 90/54   Pulse (!) 108   Ht '4\' 11"'$  (1.499 m)   Wt 134 lb (60.8 kg)   SpO2 95%   BMI 27.06 kg/m   Assessment and Plan: Problem List Items Addressed This Visit   None Visit Diagnoses     Upper respiratory tract infection, unspecified type    -  Primary   Relevant Medications   azithromycin (ZITHROMAX Z-PAK) 250 MG tablet   promethazine-dextromethorphan (PROMETHAZINE-DM) 6.25-15 MG/5ML syrup        Partially dictated using [employer]. Any errors are unintentional.  Halina Maidens, MD [facility]  04/02/2022

## 2022-04-08 ENCOUNTER — Ambulatory Visit: Payer: Self-pay | Admitting: *Deleted

## 2022-04-08 NOTE — Telephone Encounter (Signed)
Reason for Disposition  [1] HIGH RISK patient (e.g., weak immune system, age > 74 years, obesity with BMI 30 or higher, pregnant, chronic lung disease or other chronic medical condition) AND [2] COVID symptoms (e.g., cough, fever)  (Exceptions: Already seen by PCP and no new or worsening symptoms.)  Answer Assessment - Initial Assessment Questions 1. COVID-19 DIAGNOSIS: "How do you know that you have COVID?" (e.g., positive lab test or self-test, diagnosed by doctor or NP/PA, symptoms after exposure).     Home test 2. COVID-19 EXPOSURE: "Was there any known exposure to COVID before the symptoms began?" CDC Definition of close contact: within 6 feet (2 meters) for a total of 15 minutes or more over a 24-hour period.      no 3. ONSET: "When did the COVID-19 symptoms start?"      Possibly 04/02/22- in office- patient states her symptoms improved- but did not go away 4. WORST SYMPTOM: "What is your worst symptom?" (e.g., cough, fever, shortness of breath, muscle aches)     No appetite  5. COUGH: "Do you have a cough?" If Yes, ask: "How bad is the cough?"       Yes- 3-4 times/day- phlegm- white  6. FEVER: "Do you have a fever?" If Yes, ask: "What is your temperature, how was it measured, and when did it start?"     No-97.3 7. RESPIRATORY STATUS: "Describe your breathing?" (e.g., normal; shortness of breath, wheezing, unable to speak)      SOB with cough, phlegm  8. BETTER-SAME-WORSE: "Are you getting better, staying the same or getting worse compared to yesterday?"  If getting worse, ask, "In what way?"     Same- symptoms are not better 9. OTHER SYMPTOMS: "Do you have any other symptoms?"  (e.g., chills, fatigue, headache, loss of smell or taste, muscle pain, sore throat)     headache 10. HIGH RISK DISEASE: "Do you have any chronic medical problems?" (e.g., asthma, heart or lung disease, weak immune system, obesity, etc.)       Yes- age, diabetes  67. O2 SATURATION MONITOR:  "Do you use an oxygen  saturation monitor (pulse oximeter) at home?" If Yes, ask "What is your reading (oxygen level) today?" "What is your usual oxygen saturation reading?" (e.g., 95%)       no  Protocols used: Coronavirus (COVID-19) Diagnosed or Suspected-A-AH

## 2022-04-08 NOTE — Telephone Encounter (Signed)
  Chief Complaint: + COVID Symptoms: cough, loss of appetite, nausea , SOB with cough/phlegm  Frequency: not sure- patient was seen 2/6- URI Pertinent Negatives: Patient denies fever Disposition: '[]'$ ED /'[]'$ Urgent Care (no appt availability in office) / '[x]'$ Appointment(In office/virtual)/ '[]'$  [facility]/ '[]'$ Home Care/ '[]'$ Refused Recommended Disposition /'[]'$ [facility]/ '[]'$  Follow-up with PCP Additional Notes: Patient has been scheduled for virtual visit tomorrow- she can not open her MyChart- but she does have smart phone. COVID protocol- treatment/isolation reviewed- if patient gets worse- advised UC/ED

## 2022-04-09 ENCOUNTER — Encounter: Payer: Self-pay | Admitting: Internal Medicine

## 2022-04-09 ENCOUNTER — Telehealth (INDEPENDENT_AMBULATORY_CARE_PROVIDER_SITE_OTHER): Payer: Medicare Other | Admitting: Internal Medicine

## 2022-04-09 VITALS — Ht 59.0 in

## 2022-04-09 DIAGNOSIS — U071 COVID-19: Secondary | ICD-10-CM | POA: Diagnosis not present

## 2022-04-09 NOTE — Progress Notes (Signed)
Date:  04/09/2022   Name:  Elizabeth Klein   DOB:  1951/01/22   MRN:  [id number]  This encounter was conducted via video encounter. This platform was deemed appropriate for the issues to be addressed.  The patient was correctly identified.  I advised that I am conducting the visit from a secure room in my office at [facility] clinic.  The patient is located at home. The limitations of this form of encounter were discussed with the patient and he/she agreed to proceed.  Some vital signs will be absent.  Chief Complaint: Covid Positive (Tested yesterday, )  Cough This is a new problem. The current episode started in the past 7 days. The cough is Non-productive. Associated symptoms include headaches and postnasal drip. Pertinent negatives include no chest pain, chills or fever.  She is feeling better since starting Zpak on 04/02/22.  She had a positive home Covid test yesterday but symptoms likely started at least a week ago.  She has not had any fever or chills, shortness of breath.  Cough is managed with Rx syrup.   Lab Results  Component Value Date   NA 132 (L) 03/06/2022   K 5.7 (H) 03/06/2022   CO2 20 (L) 03/06/2022   GLUCOSE 91 03/06/2022   BUN 22 03/06/2022   CREATININE 0.95 03/06/2022   CALCIUM 9.0 03/06/2022   EGFR 49 (L) 03/05/2022   GFRNONAA >60 03/06/2022   Lab Results  Component Value Date   CHOL 124 08/30/2021   HDL 50 08/30/2021   LDLCALC 55 08/30/2021   TRIG 103 08/30/2021   CHOLHDL 2.5 08/30/2021   Lab Results  Component Value Date   TSH 1.360 08/30/2021   Lab Results  Component Value Date   HGBA1C 7.1 11/16/2021   Lab Results  Component Value Date   WBC 12.4 (H) 08/30/2021   HGB 11.8 08/30/2021   HCT 36.5 08/30/2021   MCV 90 08/30/2021   PLT 356 08/30/2021   Lab Results  Component Value Date   ALT 24 08/30/2021   AST 18 08/30/2021   ALKPHOS 76 08/30/2021   BILITOT 0.5 08/30/2021   Lab Results  Component Value Date   VD25OH 38.6  08/30/2021     Review of Systems  Constitutional:  Positive for appetite change and fatigue. Negative for chills and fever.  HENT:  Positive for postnasal drip.   Respiratory:  Positive for cough (at night).   Cardiovascular:  Negative for chest pain.  Gastrointestinal:  Positive for nausea. Negative for diarrhea and vomiting.  Neurological:  Positive for headaches.  Psychiatric/Behavioral:  Positive for sleep disturbance. Negative for dysphoric mood. The patient is not nervous/anxious.     Patient Active Problem List   Diagnosis Date Noted   Impingement syndrome of shoulder, right 07/09/2021   Cervical paraspinal muscle spasm 07/09/2021   Degeneration of lumbar intervertebral disc 04/19/2021   Oropharyngeal dysphagia 02/23/2019   Neuropathy due to type 2 diabetes mellitus ([place]) 08/20/2018   Microalbuminuria 04/30/2017   CKD (chronic kidney disease) stage 3, GFR 30-59 ml/min (HCC) 04/30/2017   Type II diabetes mellitus with complication ([place]) [id number]   Essential hypertension 03/19/2017   CAD (coronary artery disease) 03/19/2017   History of kidney stones 03/19/2017   Psoriasis 03/19/2017   Hyperlipidemia associated with type 2 diabetes mellitus ([place]) 03/19/2017   Nocturnal leg cramps 03/19/2017   Gait instability 03/19/2017   Primary insomnia 03/19/2017   Allergic rhinitis 03/19/2017    Allergies  Allergen Reactions  Prednisone Other (See Comments)    Intolerance  - confusion     Past Surgical History:  Procedure Laterality Date   BREAST BIOPSY Right    neg   CATARACT EXTRACTION W/PHACO Left 09/27/2019   Procedure: CATARACT EXTRACTION PHACO AND INTRAOCULAR LENS PLACEMENT ([place]) LEFT DIABETIC ISTENT INJ INTRAVITREAL KENALOG INJ;  Surgeon: Eulogio Bear, MD;  Location: [facility];  Service: Ophthalmology;  Laterality: Left;  2.83 0:28.7   CATARACT EXTRACTION W/PHACO Right 12/13/2019   Procedure: CATARACT EXTRACTION PHACO AND INTRAOCULAR LENS PLACEMENT  ([place]) RIGHT ISTENT INJ KENALOG INJ DIABETIC;  Surgeon: Eulogio Bear, MD;  Location: [facility];  Service: Ophthalmology;  Laterality: Right;  1.39 0:21.5   CESAREAN SECTION      Social History   Tobacco Use   Smoking status: Never   Smokeless tobacco: Never  Vaping Use   Vaping Use: Never used  Substance Use Topics   Alcohol use: Never   Drug use: Never     Medication list has been reviewed and updated.  Current Meds  Medication Sig   ACCU-CHEK GUIDE test strip CHECK BLOOD SUGAR 3 TIMES DAILY   acetaminophen (TYLENOL) 500 MG tablet Take 1,000 mg by mouth every 8 (eight) hours as needed.   albuterol (VENTOLIN HFA) 108 (90 Base) MCG/ACT inhaler Inhale 2 puffs into the lungs every 6 (six) hours as needed for wheezing or shortness of breath.   Ascorbic Acid (VITAMIN C PO) Take by mouth.   aspirin EC 81 MG tablet Take 1 tablet (81 mg total) by mouth daily.   atorvastatin (LIPITOR) 80 MG tablet TAKE 1 TABLET BY MOUTH ONCE  DAILY   BD INSULIN SYRINGE U/F 30G X 1/2" 0.5 ML MISC USE WITH LANTUS INJECTIONS  FOR TYPE 2 DIABETES  MELLITUS   blood glucose meter kit and supplies Dispense based on patient and insurance preference. Use up to four times daily as directed. (FOR ICD-10 E10.9, E11.9).   clobetasol (TEMOVATE) 0.05 % external solution Apply topically 2 (two) times daily. To scalp lesions   clobetasol ointment (TEMOVATE) [id number] % Apply 1 application topically 2 (two) times daily.   DUPIXENT 300 MG/2ML SOPN Inject into the skin every 14 (fourteen) days.   gabapentin (NEURONTIN) 100 MG capsule TAKE 1 CAPSULE BY MOUTH 3 TIMES  DAILY   glimepiride (AMARYL) 2 MG tablet TAKE 1 TABLET BY MOUTH DAILY  WITH BREAKFAST   hydrocortisone 2.5 % ointment Apply topically 2 (two) times daily. To lesion on arm and buttocks   insulin glargine (LANTUS) 100 UNIT/ML injection Inject 0.4 mLs (40 Units total) into the skin every morning. Pt taking QAM (Patient taking differently: Inject 27 Units  into the skin every morning. Pt taking QAM)   losartan (COZAAR) 50 MG tablet TAKE 1 TABLET BY MOUTH DAILY   meclizine (ANTIVERT) 12.5 MG tablet Take 1 tablet (12.5 mg total) by mouth 3 (three) times daily as needed for dizziness.   meloxicam (MOBIC) 7.5 MG tablet Take 1 tablet (7.5 mg total) by mouth daily.   metFORMIN (GLUCOPHAGE) 1000 MG tablet TAKE 1 TABLET BY MOUTH TWICE  DAILY WITH MEALS   metoprolol tartrate (LOPRESSOR) 50 MG tablet TAKE 1 TABLET BY MOUTH TWICE  DAILY   montelukast (SINGULAIR) 10 MG tablet TAKE 1 TABLET BY MOUTH DAILY   mupirocin ointment (BACTROBAN) 2 % Apply 1 Application topically 3 (three) times daily.   nystatin (MYCOSTATIN) 100000 UNIT/ML suspension 1 teaspoon  swish, gargle and spit four times per day.  Omega-3 Fatty Acids (FISH OIL) 1000 MG CAPS Take by mouth.   omeprazole (PRILOSEC) 20 MG capsule TAKE 1 CAPSULE BY MOUTH  DAILY   ONETOUCH DELICA PLUS LANCETS MISC 1 each by Does not apply route 2 (two) times daily.   OZEMPIC, 2 MG/DOSE, 8 MG/3ML SOPN Inject 2 mg into the skin once a week.   promethazine-dextromethorphan (PROMETHAZINE-DM) 6.25-15 MG/5ML syrup Take 5 mLs by mouth 4 (four) times daily as needed for up to 9 days for cough.   traZODone (DESYREL) 50 MG tablet TAKE 1 TABLET BY MOUTH AT  BEDTIME   vitamin B-12 (CYANOCOBALAMIN) 1000 MCG tablet Take 1,000 mcg by mouth daily.       04/09/2022   11:17 AM 04/02/2022    9:04 AM 03/05/2022    8:32 AM 02/08/2022    2:31 PM  GAD 7 : Generalized Anxiety Score  Nervous, Anxious, on Edge 1 1 1 1  $ Control/stop worrying 0 0 0 1  Worry too much - different things 1 1 1 1  $ Trouble relaxing 1 1 1 1  $ Restless 1 1 1 1  $ Easily annoyed or irritable 0 0 1 1  Afraid - awful might happen 0 0 1 1  Total GAD 7 Score 4 4 6 7  $ Anxiety Difficulty Not difficult at all Not difficult at all Somewhat difficult Somewhat difficult       04/09/2022   11:17 AM 04/02/2022    9:04 AM 03/05/2022    8:32 AM  Depression screen PHQ 2/9   Decreased Interest 0 0 1  Down, Depressed, Hopeless 0 0 0  PHQ - 2 Score 0 0 1  Altered sleeping 1 1 0  Tired, decreased energy 1 1 1  $ Change in appetite 1 1 1  $ Feeling bad or failure about yourself  0 0 1  Trouble concentrating 0 0 1  Moving slowly or fidgety/restless 1 1 1  $ Suicidal thoughts 0 0 0  PHQ-9 Score 4 4 6  $ Difficult doing work/chores Not difficult at all Not difficult at all Somewhat difficult    BP Readings from Last 3 Encounters:  04/02/22 (!) 90/54  03/05/22 112/76  02/08/22 112/76    Physical Exam Constitutional:      Appearance: Normal appearance.  Pulmonary:     Effort: Pulmonary effort is normal.  Neurological:     Mental Status: She is alert.  Psychiatric:        Mood and Affect: Mood normal.        Behavior: Behavior normal.     Wt Readings from Last 3 Encounters:  04/02/22 134 lb (60.8 kg)  03/05/22 138 lb (62.6 kg)  02/08/22 139 lb (63 kg)    Ht 4' 11"$  (1.499 m)   BMI 27.06 kg/m   Assessment and Plan: Problem List Items Addressed This Visit   None Visit Diagnoses     COVID-19 virus infection    -  Primary   Likely onset a week ago continue rest, cough syrup, fluids return precautions given       Take Tylenol 650 - 1000 mg every 6-8 hours for fever, body aches and headache. Drink plenty of fluids with electrolytes. Monitor for fever that does not decrease and/or shortness of breath that worsens or is present at rest. Quarantine for 5 days Partially dictated using [employer]. Any errors are unintentional.  I spent 9 minutes on this encounter, 100% by video.  Halina Maidens, MD [facility]  04/09/2022

## 2022-04-24 NOTE — Unmapped (Signed)
The [facility] has made a second and final attempt to reach this patient to refill the following medication:dupixent.      We have left voicemails on the following phone numbers: [phone] .    Dates contacted: 02/12,02/28  Last scheduled delivery: 12/06    The patient may be at risk of non-compliance with this medication. The patient should call the [facility] at [phone]  Option 4, then Option 2 (all other specialty patients) to refill medication.    Moshe Salisbury   [facility] Specialty Technician

## 2022-05-08 ENCOUNTER — Ambulatory Visit (INDEPENDENT_AMBULATORY_CARE_PROVIDER_SITE_OTHER): Payer: Medicare Other

## 2022-05-08 DIAGNOSIS — Z0000 Encounter for general adult medical examination without abnormal findings: Secondary | ICD-10-CM | POA: Diagnosis not present

## 2022-05-08 NOTE — Progress Notes (Signed)
Subjective:   Elizabeth Klein is a 72 y.o. female who presents for Medicare Annual (Subsequent) preventive examination.   I connected with  [name] on 05/08/22 by a audio enabled telemedicine application and verified that I am speaking with the correct [name] using two identifiers.  Patient Location: Home  Provider Location: Home Office  I discussed the limitations of evaluation and management by telemedicine. The patient expressed understanding and agreed to proceed.  Review of Systems    Refer to doctor Cardiac Risk Factors include: diabetes mellitus;dyslipidemia;hypertension     Objective:    There were no vitals filed for this visit. There is no height or weight on file to calculate BMI.     04/20/2021    4:53 PM 04/20/2021    3:37 PM 10/16/2020   10:14 AM 12/13/2019    8:28 AM 10/13/2019   10:24 AM 09/27/2019    6:45 AM 08/06/2019   10:20 AM  Advanced Directives  Does Patient Have a Medical Advance Directive? No No No No No No No  Would patient like information on creating a medical advance directive? No - Patient declined  Yes (MAU/Ambulatory/Procedural Areas - Information given) No - Patient declined Yes (MAU/Ambulatory/Procedural Areas - Information given) No - Patient declined     Current Medications (verified) Outpatient Encounter Medications as of 05/08/2022  Medication Sig   ACCU-CHEK GUIDE test strip CHECK BLOOD SUGAR 3 TIMES DAILY   acetaminophen (TYLENOL) 500 MG tablet Take 1,000 mg by mouth every 8 (eight) hours as needed.   albuterol (VENTOLIN HFA) 108 (90 Base) MCG/ACT inhaler Inhale 2 puffs into the lungs every 6 (six) hours as needed for wheezing or shortness of breath.   Ascorbic Acid (VITAMIN C PO) Take by mouth.   aspirin EC 81 MG tablet Take 1 tablet (81 mg total) by mouth daily.   atorvastatin (LIPITOR) 80 MG tablet TAKE 1 TABLET BY MOUTH ONCE  DAILY   BD INSULIN SYRINGE U/F 30G X 1/2" 0.5 ML MISC USE WITH LANTUS INJECTIONS  FOR TYPE 2  DIABETES  MELLITUS   blood glucose meter kit and supplies Dispense based on patient and insurance preference. Use up to four times daily as directed. (FOR ICD-10 E10.9, E11.9).   clobetasol (TEMOVATE) 0.05 % external solution Apply topically 2 (two) times daily. To scalp lesions   clobetasol ointment (TEMOVATE) [id number] % Apply 1 application topically 2 (two) times daily.   DUPIXENT 300 MG/2ML SOPN Inject into the skin every 14 (fourteen) days.   gabapentin (NEURONTIN) 100 MG capsule TAKE 1 CAPSULE BY MOUTH 3 TIMES  DAILY   glimepiride (AMARYL) 2 MG tablet TAKE 1 TABLET BY MOUTH DAILY  WITH BREAKFAST   hydrocortisone 2.5 % ointment Apply topically 2 (two) times daily. To lesion on arm and buttocks   insulin glargine (LANTUS) 100 UNIT/ML injection Inject 0.4 mLs (40 Units total) into the skin every morning. Pt taking QAM (Patient taking differently: Inject 27 Units into the skin every morning. Pt taking QAM)   losartan (COZAAR) 50 MG tablet TAKE 1 TABLET BY MOUTH DAILY   meclizine (ANTIVERT) 12.5 MG tablet Take 1 tablet (12.5 mg total) by mouth 3 (three) times daily as needed for dizziness.   meloxicam (MOBIC) 7.5 MG tablet Take 1 tablet (7.5 mg total) by mouth daily.   metFORMIN (GLUCOPHAGE) 1000 MG tablet TAKE 1 TABLET BY MOUTH TWICE  DAILY WITH MEALS   metoprolol tartrate (LOPRESSOR) 50 MG tablet TAKE 1 TABLET BY MOUTH TWICE  DAILY  montelukast (SINGULAIR) 10 MG tablet TAKE 1 TABLET BY MOUTH DAILY   mupirocin ointment (BACTROBAN) 2 % Apply 1 Application topically 3 (three) times daily.   nystatin (MYCOSTATIN) 100000 UNIT/ML suspension 1 teaspoon  swish, gargle and spit four times per day.   Omega-3 Fatty Acids (FISH OIL) 1000 MG CAPS Take by mouth.   omeprazole (PRILOSEC) 20 MG capsule TAKE 1 CAPSULE BY MOUTH  DAILY   ONETOUCH DELICA PLUS LANCETS MISC 1 each by Does not apply route 2 (two) times daily.   OZEMPIC, 2 MG/DOSE, 8 MG/3ML SOPN Inject 2 mg into the skin once a week.   traZODone  (DESYREL) 50 MG tablet TAKE 1 TABLET BY MOUTH AT  BEDTIME   vitamin B-12 (CYANOCOBALAMIN) 1000 MCG tablet Take 1,000 mcg by mouth daily.   No facility-administered encounter medications on file as of 05/08/2022.    Allergies (verified) Prednisone   History: Past Medical History:  Diagnosis Date   Acid reflux    Chest pain    Diabetes mellitus without complication (HCC)    Hepatitis    age 40   High cholesterol    Hypertension    Kidney stones    Migraines    Psoriasis    Rapid heart rate    Shingles    spring 2021   Vertigo    1x - approx 12 yrs ago   Weight loss    Past Surgical History:  Procedure Laterality Date   BREAST BIOPSY Right    neg   CATARACT EXTRACTION W/PHACO Left 09/27/2019   Procedure: CATARACT EXTRACTION PHACO AND INTRAOCULAR LENS PLACEMENT ([place]) LEFT DIABETIC ISTENT INJ INTRAVITREAL KENALOG INJ;  Surgeon: Eulogio Bear, MD;  Location: [facility];  Service: Ophthalmology;  Laterality: Left;  2.83 0:28.7   CATARACT EXTRACTION W/PHACO Right 12/13/2019   Procedure: CATARACT EXTRACTION PHACO AND INTRAOCULAR LENS PLACEMENT ([place]) RIGHT ISTENT INJ KENALOG INJ DIABETIC;  Surgeon: Eulogio Bear, MD;  Location: [facility];  Service: Ophthalmology;  Laterality: Right;  1.39 0:21.5   CESAREAN SECTION     Family History  Problem Relation Age of Onset   Breast cancer Neg Hx    Diabetes Brother    Heart attack Brother    Diabetes Brother    Heart attack Brother    Diabetes Brother    Social History   Socioeconomic History   Marital status: Married    Spouse name: Not on file   Number of children: 1   Years of education: Not on file   Highest education level: Not on file  Occupational History   Not on file  Tobacco Use   Smoking status: Never   Smokeless tobacco: Never  Vaping Use   Vaping Use: Never used  Substance and Sexual Activity   Alcohol use: Never   Drug use: Never   Sexual activity: Not on file  Other Topics  Concern   Not on file  Social History Narrative   ** Merged History Encounter **            Social Determinants of Health   Financial Resource Strain: Low Risk  (07/06/2021)   Overall Financial Resource Strain (CARDIA)    Difficulty of Paying Living Expenses: Not hard at all  Food Insecurity: No Food Insecurity (05/08/2022)   Hunger Vital Sign    Worried About Running Out of Food in the Last Year: Never true    Ran Out of Food in the Last Year: Never true  Transportation Needs:  No Transportation Needs (05/08/2022)   PRAPARE - Hydrologist (Medical): No    Lack of Transportation (Non-Medical): No  Physical Activity: Inactive (10/16/2020)   Exercise Vital Sign    Days of Exercise per Week: 0 days    Minutes of Exercise per Session: 0 min  Stress: No Stress Concern Present (10/16/2020)   [place]    Feeling of Stress : Only a little  Social Connections: Moderately Isolated (05/08/2022)   Social Connection and Isolation Panel [NHANES]    Frequency of Communication with Friends and Family: More than three times a week    Frequency of Social Gatherings with Friends and Family: Twice a week    Attends Religious Services: Never    Marine scientist or Organizations: No    Attends Music therapist: Never    Marital Status: Married    Tobacco Counseling Counseling given: Not Answered   Clinical Intake:     Pain : No/denies pain     Nutritional Risks: None Diabetes: Yes  How often do you need to have someone help you when you read instructions, pamphlets, or other written materials from your doctor or pharmacy?: 1 - Never  Diabetic?Yes  Interpreter Needed?: No      Activities of Daily Living    05/08/2022    1:48 PM  In your present state of health, do you have any difficulty performing the following activities:  Hearing? 1  Vision? 0  Difficulty  concentrating or making decisions? 0  Walking or climbing stairs? 0  Dressing or bathing? 0  Doing errands, shopping? 0  Preparing Food and eating ? Y  Using the Toilet? Y  In the past six months, have you accidently leaked urine? Y  Do you have problems with loss of bowel control? Y  Managing your Medications? Y  Managing your Finances? Y  Housekeeping or managing your Housekeeping? Y    Patient Care Team: Glean Hess, MD as PCP - General (Internal Medicine) Lonia Farber, MD as Consulting Physician (Endocrinology) Laneta Simmers as Physician Assistant (Urology) Eulogio Bear, MD as Consulting Physician (Ophthalmology) Lonia Blood, MD as Referring Physician (Dermatology) Yolonda Kida, MD as Consulting Physician (Cardiology) Glean Hess, MD (Internal Medicine)  Indicate any recent Medical Services you may have received from other than [facility] providers in the past year (date may be approximate).     Assessment:   This is a routine wellness examination for Elizabeth Klein.  Hearing/Vision screen Hearing Screening - Comments:: Yes sometimes  Dietary issues and exercise activities discussed: Current Exercise Habits: The patient does not participate in regular exercise at present, Exercise limited by: None identified   Goals Addressed   None   Depression Screen    05/08/2022    1:48 PM 04/09/2022   11:17 AM 04/02/2022    9:04 AM 03/05/2022    8:32 AM 02/08/2022    2:31 PM 01/29/2022    2:27 PM 01/07/2022    4:07 PM  PHQ 2/9 Scores  PHQ - 2 Score 0 0 0 '1 1 1 2  '$ PHQ- 9 Score '5 4 4 6 4 4 6    '$ Fall Risk    05/08/2022    1:48 PM 04/02/2022    9:04 AM 03/05/2022    8:32 AM 01/29/2022    2:28 PM 01/07/2022    4:07 PM  Fall Risk   Falls in the  past year? 0 0 0 0 0  Number falls in past yr: 0 0 0 0 0  Injury with Fall? 0 0 0 0 0  Risk for fall due to : No Fall Risks No Fall Risks No Fall Risks No Fall Risks No Fall Risks  Follow up Falls evaluation  completed Falls evaluation completed Falls evaluation completed Falls evaluation completed Falls evaluation completed    [place]:  Any stairs in or around the home? Yes  If so, are there any without handrails? NO Home free of loose throw rugs in walkways, pet beds, electrical cords, etc? Yes  Adequate lighting in your home to reduce risk of falls? Yes   ASSISTIVE DEVICES UTILIZED TO PREVENT FALLS:  Life alert? No  Use of a cane, walker or w/c? No  Grab bars in the bathroom? No  Shower chair or bench in shower? Yes  Elevated toilet seat or a handicapped toilet? No   TIMED UP AND GO:  Was the test performed? No . n Length of time to ambulate 10 feet: NA sec.   Gait steady and fast with assistive device  Cognitive Function:        05/08/2022    1:50 PM 10/13/2019   10:33 AM 10/07/2018   10:21 AM  6CIT Screen  What Year? 0 points 0 points 0 points  What month? 0 points 0 points 0 points  What time? 0 points 0 points 0 points  Count back from 20 0 points 0 points 0 points  Months in reverse 0 points 0 points 0 points  Repeat phrase 2 points 0 points 0 points  Total Score 2 points 0 points 0 points    Immunizations Immunization History  Administered Date(s) Administered   Influenza, High Dose Seasonal PF 01/07/2018, 12/02/2018, 11/21/2021   Influenza-Unspecified 10/26/2016, 11/25/2020   PFIZER Comirnaty(Gray Top)Covid-19 Tri-Sucrose Vaccine 11/29/2021   PFIZER(Purple Top)SARS-COV-2 Vaccination 04/21/2019, 05/12/2019, 11/23/2019, 06/16/2020, 12/20/2020   Pneumococcal Conjugate-13 03/19/2017   Pneumococcal Polysaccharide-23 08/20/2018   Zoster Recombinat (Shingrix) 04/30/2017, 02/23/2020    TDAP status: Due, Education has been provided regarding the importance of this vaccine. Advised may receive this vaccine at local pharmacy or Health Dept. Aware to provide a copy of the vaccination record if obtained from local pharmacy or Health  Dept. Verbalized acceptance and understanding.  Flu Vaccine status: Up to date  Pneumococcal vaccine status: Up to date  Covid-19 vaccine status: Completed vaccines  Qualifies for Shingles Vaccine? Yes   Zostavax completed Yes   Shingrix Completed?: Yes  Screening Tests Health Maintenance  Topic Date Due   DTaP/Tdap/Td (1 - Tdap) Never done   COVID-19 Vaccine (7 - 2023-24 season) 01/24/2022   HEMOGLOBIN A1C  05/17/2022   Diabetic kidney evaluation - Urine ACR  08/31/2022   FOOT EXAM  08/31/2022   OPHTHALMOLOGY EXAM  10/13/2022   MAMMOGRAM  11/01/2022   Diabetic kidney evaluation - eGFR measurement  03/07/2023   Medicare Annual Wellness (AWV)  05/08/2023   DEXA SCAN  11/01/2023   COLONOSCOPY (Pts 45-73yr Insurance coverage will need to be confirmed)  09/25/2025   Pneumonia Vaccine 70 Years old  Completed   INFLUENZA VACCINE  Completed   Hepatitis C Screening  Completed   Zoster Vaccines- Shingrix  Completed   HPV VACCINES  Aged Out    Health Maintenance  Health Maintenance Due  Topic Date Due   DTaP/Tdap/Td (1 - Tdap) Never done   COVID-19 Vaccine (7 - 2023-24  season) 01/24/2022    Colorectal cancer screening: Type of screening: Colonoscopy. Completed 09/26/2015. Repeat every 10 years  Mammogram status: Completed 10/31/2021. Repeat every year  Bone Density status: Completed 10/31/2020. Results reflect: Bone density results: OSTEOPENIA. Repeat every 10 years.  Lung Cancer Screening: (Low Dose CT Chest recommended if Age 38-80 years, 30 pack-year currently smoking OR have quit w/in 15years.) does not qualify.   Lung Cancer Screening Referral: no  Additional Screening:  Hepatitis C Screening: does qualify; Completed 06/02/2017  Vision Screening: Recommended annual ophthalmology exams for early detection of glaucoma and other disorders of the eye. Is the patient up to date with their annual eye exam?  Yes  Who is the provider or what is the name of the office in which  the patient attends annual eye exams? Dr. Sharol Harness, [place] If pt is not established with a provider, would they like to be referred to a provider to establish care? No .   Dental Screening: Recommended annual dental exams for proper oral hygiene  Community Resource Referral / Chronic Care Management: CRR required this visit?  No   CCM required this visit?  No      Plan:     I have personally reviewed and noted the following in the patient's chart:   Medical and social history Use of alcohol, tobacco or illicit drugs  Current medications and supplements including opioid prescriptions. Patient is not currently taking opioid prescriptions. Functional ability and status Nutritional status Physical activity Advanced directives List of other physicians Hospitalizations, surgeries, and ER visits in previous 12 months Vitals Screenings to include cognitive, depression, and falls Referrals and appointments  In addition, I have reviewed and discussed with patient certain preventive protocols, quality metrics, and best practice recommendations. A written personalized care plan for preventive services as well as general preventive health recommendations were provided to patient.     Ival Bible Louis Gaw, CMA   05/08/2022   Nurse Notes: visit took 20 mins

## 2022-05-17 DIAGNOSIS — Z794 Long term (current) use of insulin: Secondary | ICD-10-CM | POA: Diagnosis not present

## 2022-05-17 DIAGNOSIS — E1159 Type 2 diabetes mellitus with other circulatory complications: Secondary | ICD-10-CM | POA: Diagnosis not present

## 2022-05-17 DIAGNOSIS — E1169 Type 2 diabetes mellitus with other specified complication: Secondary | ICD-10-CM | POA: Diagnosis not present

## 2022-05-17 DIAGNOSIS — E1142 Type 2 diabetes mellitus with diabetic polyneuropathy: Secondary | ICD-10-CM | POA: Diagnosis not present

## 2022-05-17 DIAGNOSIS — I152 Hypertension secondary to endocrine disorders: Secondary | ICD-10-CM | POA: Diagnosis not present

## 2022-05-17 DIAGNOSIS — E785 Hyperlipidemia, unspecified: Secondary | ICD-10-CM | POA: Diagnosis not present

## 2022-06-22 ENCOUNTER — Other Ambulatory Visit: Payer: Self-pay | Admitting: Internal Medicine

## 2022-06-22 DIAGNOSIS — I10 Essential (primary) hypertension: Secondary | ICD-10-CM

## 2022-06-22 DIAGNOSIS — I2510 Atherosclerotic heart disease of native coronary artery without angina pectoris: Secondary | ICD-10-CM

## 2022-06-24 NOTE — Telephone Encounter (Signed)
Requested Prescriptions  Pending Prescriptions Disp Refills   metoprolol tartrate (LOPRESSOR) 50 MG tablet [Pharmacy Med Name: Metoprolol Tartrate 50 MG Oral Tablet] 200 tablet 0    Sig: TAKE 1 TABLET BY MOUTH TWICE  DAILY     Cardiovascular:  Beta Blockers Passed - 06/22/2022  4:16 AM      Passed - Last BP in normal range    BP Readings from Last 1 Encounters:  04/02/22 (!) 90/54         Passed - Last Heart Rate in normal range    Pulse Readings from Last 1 Encounters:  04/02/22 (!) 108         Passed - Valid encounter within last 6 months    Recent Outpatient Visits           2 months ago COVID-19 virus infection   [place] [facility] at [facility], Nyoka Cowden, MD   2 months ago Upper respiratory tract infection, unspecified type   [facility] at [facility], Nyoka Cowden, MD   3 months ago Essential hypertension   [place] [facility] at [facility], Nyoka Cowden, MD   4 months ago Vertigo   [facility] at [facility], Nyoka Cowden, MD   4 months ago Contusion of right knee, initial encounter   [facility] at [facility], Nyoka Cowden, MD       Future Appointments             In 2 months Judithann Graves, Nyoka Cowden, MD [facility] at [facility], [facility] [address]

## 2022-07-09 ENCOUNTER — Ambulatory Visit: Payer: Self-pay | Admitting: *Deleted

## 2022-07-09 NOTE — Telephone Encounter (Signed)
Summary: Medication question   Question regarding a medication she use to take.

## 2022-07-09 NOTE — Telephone Encounter (Addendum)
Pt called, pt states she no longer had question regarding medication that she figured it out by looking back at her AVS from Dr Gershon Crane. No further assistance needed.   Summary: Medication question   Question regarding a medication she use to take.

## 2022-07-17 NOTE — Unmapped (Signed)
The [facility] has made a third and final attempt to reach this patient to refill the following medication:Dupixent.      We have left voicemails on the following phone numbers: [phone] (also made attempts at [phone], not sure which is correct phone number) .    Dates contacted: 2/12, 2/28, 07/17/22  Last scheduled delivery: 01/30/22    The patient may be at risk of non-compliance with this medication. The patient should call the [facility] at [phone]  Option 4, then Option 2: Dermatology, Gastroenterology, Rheumatology to refill medication.    Darryl Nestle, PharmD   [facility] Specialty Pharmacist

## 2022-07-18 NOTE — Unmapped (Signed)
Specialty Medication(s): Dupixent    Ms.Griffeth has been dis-enrolled from the [facility] specialty pharmacy services due to multiple unsuccessful outreach attempts by the pharmacy.    Additional information provided to the patient: see notes from [place] for attempts.     Lanney Gins  [facility] Specialty Pharmacist

## 2022-07-24 ENCOUNTER — Ambulatory Visit
Admit: 2022-07-24 | Discharge: 2022-07-25 | Payer: MEDICARE | Attending: Student in an Organized Health Care Education/Training Program | Primary: Student in an Organized Health Care Education/Training Program

## 2022-07-24 DIAGNOSIS — L281 Prurigo nodularis: Secondary | ICD-10-CM | POA: Diagnosis not present

## 2022-07-24 DIAGNOSIS — L409 Psoriasis, unspecified: Secondary | ICD-10-CM | POA: Diagnosis not present

## 2022-07-24 MED ORDER — NALTREXONE 1.5 MG CAPSULE
ORAL_CAPSULE | Freq: Every day | ORAL | 11 refills | 0.00000 days | Status: CP
Start: 2022-07-24 — End: ?

## 2022-07-24 MED ORDER — CLOBETASOL 0.05 % TOPICAL OINTMENT
OPHTHALMIC | 3 refills | 0.00000 days | Status: CP
Start: 2022-07-24 — End: ?

## 2022-08-05 ENCOUNTER — Ambulatory Visit: Payer: Medicare Other | Admitting: Family Medicine

## 2022-08-05 ENCOUNTER — Ambulatory Visit: Payer: Self-pay

## 2022-08-05 NOTE — Telephone Encounter (Signed)
  Chief Complaint: Difficulty breathing Symptoms: above Frequency: A few times recently Pertinent Negatives: Patient denies current s/s Disposition: [] ED /[] Urgent Care (no appt availability in office) / [x] Appointment(In office/virtual)/ []  [place] [facility]/ [] Home Care/ [] Refused Recommended Disposition /[] [facility]/ []  Follow-up with PCP Additional Notes: Pt states that she has had a few instances of difficulty breathing. 2 times were in the evening and pt used an inhaler which relieved the symptoms. Pt had another episode today during the day.   Reason for Disposition  [1] MILD difficulty breathing (e.g., minimal/no SOB at rest, SOB with walking, pulse <100) AND [2] NEW-onset or WORSE than normal  Protocols used: Breathing Difficulty-A-AH

## 2022-08-06 ENCOUNTER — Ambulatory Visit (INDEPENDENT_AMBULATORY_CARE_PROVIDER_SITE_OTHER): Payer: Medicare Other | Admitting: Internal Medicine

## 2022-08-06 ENCOUNTER — Encounter: Payer: Self-pay | Admitting: Internal Medicine

## 2022-08-06 VITALS — BP 122/70 | HR 104 | Ht 59.0 in | Wt 137.0 lb

## 2022-08-06 DIAGNOSIS — R0981 Nasal congestion: Secondary | ICD-10-CM | POA: Diagnosis not present

## 2022-08-06 DIAGNOSIS — E118 Type 2 diabetes mellitus with unspecified complications: Secondary | ICD-10-CM

## 2022-08-06 DIAGNOSIS — Z7984 Long term (current) use of oral hypoglycemic drugs: Secondary | ICD-10-CM

## 2022-08-06 DIAGNOSIS — E119 Type 2 diabetes mellitus without complications: Secondary | ICD-10-CM

## 2022-08-06 LAB — POCT GLYCOSYLATED HEMOGLOBIN (HGB A1C): Hemoglobin A1C: 7.5 % — AB (ref 4.0–5.6)

## 2022-08-06 LAB — HM DIABETES EYE EXAM

## 2022-08-06 NOTE — Progress Notes (Addendum)
Date:  08/06/2022   Name:  Elizabeth Klein   DOB:  1950/10/19   MRN:  [id number]   Chief Complaint: Asthma (Patient c/o of having a few episodes of difficulty breathing. Says she has used her vicks nasal inhaler during these episodes and her  vicks resolves the symptoms. Patient did have COVID 4 months ago.)  Sinus Problem This is a recurrent problem. The current episode started in the past 7 days. The problem is unchanged. There has been no fever. Associated symptoms include congestion. Pertinent negatives include no diaphoresis, headaches, shortness of breath or sore throat. Treatments tried: vicks vapo-inhaler.  Diabetes She presents for her follow-up diabetic visit. She has type 2 diabetes mellitus. Her disease course has been improving. Pertinent negatives for hypoglycemia include no dizziness, headaches or nervousness/anxiousness. Pertinent negatives for diabetes include no chest pain.    Lab Results  Component Value Date   NA 132 (L) 03/06/2022   K 5.7 (H) 03/06/2022   CO2 20 (L) 03/06/2022   GLUCOSE 91 03/06/2022   BUN 22 03/06/2022   CREATININE 0.95 03/06/2022   CALCIUM 9.0 03/06/2022   EGFR 49 (L) 03/05/2022   GFRNONAA >60 03/06/2022   Lab Results  Component Value Date   CHOL 124 08/30/2021   HDL 50 08/30/2021   LDLCALC 55 08/30/2021   TRIG 103 08/30/2021   CHOLHDL 2.5 08/30/2021   Lab Results  Component Value Date   TSH 1.360 08/30/2021   Lab Results  Component Value Date   HGBA1C 7.5 (A) 08/06/2022   Lab Results  Component Value Date   WBC 12.4 (H) 08/30/2021   HGB 11.8 08/30/2021   HCT 36.5 08/30/2021   MCV 90 08/30/2021   PLT 356 08/30/2021   Lab Results  Component Value Date   ALT 24 08/30/2021   AST 18 08/30/2021   ALKPHOS 76 08/30/2021   BILITOT 0.5 08/30/2021   Lab Results  Component Value Date   VD25OH 38.6 08/30/2021     Review of Systems  Constitutional:  Negative for diaphoresis and fever.  HENT:  Positive for congestion.  Negative for sinus pain and sore throat.   Respiratory:  Negative for chest tightness, shortness of breath and wheezing.   Cardiovascular:  Negative for chest pain, palpitations and leg swelling.  Neurological:  Negative for dizziness and headaches.  Psychiatric/Behavioral:  Negative for dysphoric mood and sleep disturbance. The patient is not nervous/anxious.     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   Microalbuminuria 04/30/2017   CKD (chronic kidney disease) stage 3, GFR 30-59 ml/min (HCC) 04/30/2017   Type II diabetes mellitus with complication (HCC) 03/19/2017   Essential hypertension 03/19/2017   CAD (coronary artery disease) 03/19/2017   History of kidney stones 03/19/2017   Psoriasis 03/19/2017   Hyperlipidemia associated with type 2 diabetes mellitus (HCC) 03/19/2017   Nocturnal leg cramps 03/19/2017   Gait instability 03/19/2017   Primary insomnia 03/19/2017   Allergic rhinitis 03/19/2017    Allergies  Allergen Reactions   Prednisone Other (See Comments)    Intolerance  - confusion     Past Surgical History:  Procedure Laterality Date   BREAST BIOPSY Right    neg   CATARACT EXTRACTION W/PHACO Left 09/27/2019   Procedure:  CATARACT EXTRACTION PHACO AND INTRAOCULAR LENS PLACEMENT (IOC) LEFT DIABETIC ISTENT INJ INTRAVITREAL KENALOG INJ;  Surgeon: Nevada Crane, MD;  Location: [facility];  Service: Ophthalmology;  Laterality: Left;  2.83 0:28.7   CATARACT EXTRACTION W/PHACO Right 12/13/2019   Procedure: CATARACT EXTRACTION PHACO AND INTRAOCULAR LENS PLACEMENT (IOC) RIGHT ISTENT INJ KENALOG INJ DIABETIC;  Surgeon: Nevada Crane, MD;  Location: [facility];  Service: Ophthalmology;   Laterality: Right;  1.39 0:21.5   CESAREAN SECTION      Social History   Tobacco Use   Smoking status: Never   Smokeless tobacco: Never  Vaping Use   Vaping Use: Never used  Substance Use Topics   Alcohol use: Never   Drug use: Never     Medication list has been reviewed and updated.  Current Meds  Medication Sig   ACCU-CHEK GUIDE test strip CHECK BLOOD SUGAR 3 TIMES DAILY   acetaminophen (TYLENOL) 500 MG tablet Take 1,000 mg by mouth every 8 (eight) hours as needed.   albuterol (VENTOLIN HFA) 108 (90 Base) MCG/ACT inhaler Inhale 2 puffs into the lungs every 6 (six) hours as needed for wheezing or shortness of breath.   Ascorbic Acid (VITAMIN C PO) Take by mouth.   aspirin EC 81 MG tablet Take 1 tablet (81 mg total) by mouth daily.   atorvastatin (LIPITOR) 80 MG tablet TAKE 1 TABLET BY MOUTH ONCE  DAILY   BD INSULIN SYRINGE U/F 30G X 1/2" 0.5 ML MISC USE WITH LANTUS INJECTIONS  FOR TYPE 2 DIABETES  MELLITUS   blood glucose meter kit and supplies Dispense based on patient and insurance preference. Use up to four times daily as directed. (FOR ICD-10 E10.9, E11.9).   clobetasol (TEMOVATE) 0.05 % external solution Apply topically 2 (two) times daily. To scalp lesions   clobetasol ointment (TEMOVATE) 0.05 % Apply 1 application topically 2 (two) times daily.   DUPIXENT 300 MG/2ML SOPN Inject into the skin every 14 (fourteen) days.   gabapentin (NEURONTIN) 100 MG capsule TAKE 1 CAPSULE BY MOUTH 3 TIMES  DAILY   glimepiride (AMARYL) 2 MG tablet TAKE 1 TABLET BY MOUTH DAILY  WITH BREAKFAST   hydrocortisone 2.5 % ointment Apply topically 2 (two) times daily. To lesion on arm and buttocks   insulin glargine (LANTUS) 100 UNIT/ML injection Inject 0.4 mLs (40 Units total) into the skin every morning. Pt taking QAM (Patient taking differently: Inject 27 Units into the skin every morning. Pt taking QAM)   losartan (COZAAR) 50 MG tablet TAKE 1 TABLET BY MOUTH DAILY   meclizine (ANTIVERT) 12.5 MG  tablet Take 1 tablet (12.5 mg total) by mouth 3 (three) times daily as needed for dizziness.   meloxicam (MOBIC) 7.5 MG tablet Take 1 tablet (7.5 mg total) by mouth daily.   metFORMIN (GLUCOPHAGE) 1000 MG tablet TAKE 1 TABLET BY MOUTH TWICE  DAILY WITH MEALS   metoprolol tartrate (LOPRESSOR) 50 MG tablet TAKE 1 TABLET BY MOUTH TWICE  DAILY   montelukast (SINGULAIR) 10 MG tablet TAKE 1 TABLET BY MOUTH DAILY   mupirocin ointment (BACTROBAN) 2 % Apply 1 Application topically 3 (three) times daily.   nystatin (MYCOSTATIN) 100000 UNIT/ML suspension 1 teaspoon  swish, gargle and spit four times per day.   Omega-3 Fatty Acids (FISH OIL) 1000 MG CAPS Take by mouth.   omeprazole (PRILOSEC) 20 MG capsule TAKE 1 CAPSULE BY MOUTH  DAILY   ONETOUCH DELICA PLUS LANCETS MISC 1 each by Does not apply route 2 (  two) times daily.   OZEMPIC, 2 MG/DOSE, 8 MG/3ML SOPN Inject 2 mg into the skin once a week.   phenylephrine (NEO-SYNEPHRINE) 0.5 % nasal solution Place 1 drop into both nostrils every 6 (six) hours as needed for congestion. VICKS NASAL INHALER   traZODone (DESYREL) 50 MG tablet TAKE 1 TABLET BY MOUTH AT  BEDTIME   UNABLE TO FIND Med Name: Naltrexone 3 mg - Bud 180mg    vitamin B-12 (CYANOCOBALAMIN) 1000 MCG tablet Take 1,000 mcg by mouth daily.       04/09/2022   11:17 AM 04/02/2022    9:04 AM 03/05/2022    8:32 AM 02/08/2022    2:31 PM  GAD 7 : Generalized Anxiety Score  Nervous, Anxious, on Edge 1 1 1 1   Control/stop worrying 0 0 0 1  Worry too much - different things 1 1 1 1   Trouble relaxing 1 1 1 1   Restless 1 1 1 1   Easily annoyed or irritable 0 0 1 1  Afraid - awful might happen 0 0 1 1  Total GAD 7 Score 4 4 6 7   Anxiety Difficulty Not difficult at all Not difficult at all Somewhat difficult Somewhat difficult       05/08/2022    1:48 PM 04/09/2022   11:17 AM 04/02/2022    9:04 AM  Depression screen PHQ 2/9  Decreased Interest 0 0 0  Down, Depressed, Hopeless 0 0 0  PHQ - 2 Score 0 0 0   Altered sleeping 2 1 1   Tired, decreased energy 1 1 1   Change in appetite 1 1 1   Feeling bad or failure about yourself  0 0 0  Trouble concentrating 0 0 0  Moving slowly or fidgety/restless 1 1 1   Suicidal thoughts 0 0 0  PHQ-9 Score 5 4 4   Difficult doing work/chores Not difficult at all Not difficult at all Not difficult at all    BP Readings from Last 3 Encounters:  08/06/22 122/70  04/02/22 (!) 90/54  03/05/22 112/76    Physical Exam Constitutional:      Appearance: Normal appearance. She is well-developed.  HENT:     Right Ear: Ear canal and external ear normal. Tympanic membrane is not erythematous or retracted.     Left Ear: Ear canal and external ear normal. Tympanic membrane is not erythematous or retracted.     Nose:     Right Sinus: No maxillary sinus tenderness or frontal sinus tenderness.     Left Sinus: No maxillary sinus tenderness or frontal sinus tenderness.     Mouth/Throat:     Mouth: No oral lesions.     Pharynx: Uvula midline. No oropharyngeal exudate or posterior oropharyngeal erythema.  Cardiovascular:     Rate and Rhythm: Normal rate and regular rhythm.     Heart sounds: Normal heart sounds.  Pulmonary:     Effort: Pulmonary effort is normal.     Breath sounds: Normal breath sounds. No decreased breath sounds, wheezing or rales.  Musculoskeletal:     Cervical back: Normal range of motion.  Lymphadenopathy:     Cervical: No cervical adenopathy.  Neurological:     Mental Status: She is alert and oriented to person, place, and time.     Wt Readings from Last 3 Encounters:  08/06/22 137 lb (62.1 kg)  04/02/22 134 lb (60.8 kg)  03/05/22 138 lb (62.6 kg)    BP 122/70   Pulse (!) 104   Ht 4\' 11"  (1.499 m)  Wt 137 lb (62.1 kg)   SpO2 98%   BMI 27.67 kg/m   Assessment and Plan:  Problem List Items Addressed This Visit     Type II diabetes mellitus with complication (HCC) (Chronic)    Blood sugars stable without hypoglycemic symptoms or  events. Last seen by Endo 04/2022. Currently being treated with metformin and insulin. Lab Results  Component Value Date   HGBA1C 7.1 11/16/2021  Last A1C 8.2 A1C today 7.5 Eye exam due soon - in office exam done today      Relevant Orders   POCT glycosylated hemoglobin (Hb A1C) (Completed)   Other Visit Diagnoses     Nasal congestion    -  Primary   Contine Vicks inhaler PRN no evidence if infection can begin Flonase daily if symptoms are persistent   Diabetes mellitus treated with oral medication (HCC)           No follow-ups on file.   Partially dictated using Dragon software, any errors are not intentional.  Reubin Milan, MD [facility] [place], [place]

## 2022-08-06 NOTE — Assessment & Plan Note (Addendum)
Blood sugars stable without hypoglycemic symptoms or events. Last seen by Endo 04/2022. Currently being treated with metformin and insulin. Lab Results  Component Value Date   HGBA1C 7.1 11/16/2021  Last A1C 8.2 A1C today 7.5 Eye exam due soon - in office exam done today

## 2022-08-14 ENCOUNTER — Other Ambulatory Visit: Payer: Self-pay | Admitting: Internal Medicine

## 2022-08-14 DIAGNOSIS — J309 Allergic rhinitis, unspecified: Secondary | ICD-10-CM

## 2022-09-03 ENCOUNTER — Ambulatory Visit (INDEPENDENT_AMBULATORY_CARE_PROVIDER_SITE_OTHER): Payer: Medicare Other | Admitting: Internal Medicine

## 2022-09-03 ENCOUNTER — Encounter: Payer: Self-pay | Admitting: Internal Medicine

## 2022-09-03 VITALS — BP 122/70 | HR 91 | Ht 59.0 in | Wt 136.6 lb

## 2022-09-03 DIAGNOSIS — Z0000 Encounter for general adult medical examination without abnormal findings: Secondary | ICD-10-CM | POA: Diagnosis not present

## 2022-09-03 DIAGNOSIS — Z23 Encounter for immunization: Secondary | ICD-10-CM

## 2022-09-03 DIAGNOSIS — K219 Gastro-esophageal reflux disease without esophagitis: Secondary | ICD-10-CM

## 2022-09-03 DIAGNOSIS — I10 Essential (primary) hypertension: Secondary | ICD-10-CM | POA: Diagnosis not present

## 2022-09-03 DIAGNOSIS — E118 Type 2 diabetes mellitus with unspecified complications: Secondary | ICD-10-CM

## 2022-09-03 DIAGNOSIS — E1169 Type 2 diabetes mellitus with other specified complication: Secondary | ICD-10-CM

## 2022-09-03 DIAGNOSIS — E785 Hyperlipidemia, unspecified: Secondary | ICD-10-CM | POA: Diagnosis not present

## 2022-09-03 DIAGNOSIS — I2510 Atherosclerotic heart disease of native coronary artery without angina pectoris: Secondary | ICD-10-CM

## 2022-09-03 DIAGNOSIS — Z7984 Long term (current) use of oral hypoglycemic drugs: Secondary | ICD-10-CM | POA: Diagnosis not present

## 2022-09-03 DIAGNOSIS — Z1231 Encounter for screening mammogram for malignant neoplasm of breast: Secondary | ICD-10-CM

## 2022-09-03 DIAGNOSIS — N1831 Chronic kidney disease, stage 3a: Secondary | ICD-10-CM

## 2022-09-03 MED ORDER — METFORMIN HCL 1000 MG PO TABS: 1000.0000 mg | ORAL_TABLET | Freq: Two times a day (BID) | ORAL | 1 refills | Status: DC

## 2022-09-03 MED ORDER — METOPROLOL TARTRATE 50 MG PO TABS: 50.0000 mg | ORAL_TABLET | Freq: Two times a day (BID) | ORAL | 1 refills | Status: DC

## 2022-09-03 MED ORDER — OMEPRAZOLE 20 MG PO CPDR: 20.0000 mg | DELAYED_RELEASE_CAPSULE | Freq: Every day | ORAL | 0 refills | Status: DC

## 2022-09-03 MED ORDER — ATORVASTATIN CALCIUM 80 MG PO TABS: 80.0000 mg | ORAL_TABLET | Freq: Every day | ORAL | 3 refills | Status: DC

## 2022-09-03 NOTE — Assessment & Plan Note (Addendum)
Stable exam with well controlled BP.  Currently taking metoprolol primarily for palpitations.  Losartan stopped by Endo due to elevated K+. Tolerating medications without concerns or side effects. Will continue to recommend low sodium diet and current regimen.

## 2022-09-03 NOTE — Assessment & Plan Note (Addendum)
Blood sugars stable without hypoglycemic symptoms or events. Currently being treated with metformin and insulin. Followed by Endo Lab Results  Component Value Date   HGBA1C 7.5 (A) 08/06/2022

## 2022-09-03 NOTE — Patient Instructions (Signed)
Call [facility] to schedule your mammogram at [phone].  

## 2022-09-03 NOTE — Assessment & Plan Note (Signed)
GFR stable around 50.

## 2022-09-03 NOTE — Progress Notes (Signed)
Date:  09/03/2022   Name:  Elizabeth Klein   DOB:  10/22/1950   MRN:  [id number]   Chief Complaint: Annual Exam Elizabeth Klein is a 72 y.o. female who presents today for her Complete Annual Exam. She feels fairly well. She reports exercising- none. She reports she is sleeping well. Breast complaints - none.  Mammogram: 10/2021 MCM DEXA: 10/2020 osteopenia Colonoscopy: 09/2015 repeat 10 yrs  Health Maintenance Due  Topic Date Due   DTaP/Tdap/Td (1 - Tdap) Never done   COVID-19 Vaccine (7 - 2023-24 season) 01/24/2022   Diabetic kidney evaluation - Urine ACR  08/31/2022    Immunization History  Administered Date(s) Administered   Influenza, High Dose Seasonal PF 01/07/2018, 12/02/2018, 11/21/2021   Influenza-Unspecified 10/26/2016, 11/25/2020   PFIZER Comirnaty(Gray Top)Covid-19 Tri-Sucrose Vaccine 11/29/2021   PFIZER(Purple Top)SARS-COV-2 Vaccination 04/21/2019, 05/12/2019, 11/23/2019, 06/16/2020, 12/20/2020   PNEUMOCOCCAL CONJUGATE-20 09/03/2022   Pneumococcal Conjugate-13 03/19/2017   Pneumococcal Polysaccharide-23 08/20/2018   Zoster Recombinant(Shingrix) 04/30/2017, 02/23/2020    Hypertension This is a chronic problem. The problem is controlled. Pertinent negatives include no chest pain, headaches, palpitations or shortness of breath. Past treatments include angiotensin blockers and beta blockers. The current treatment provides significant improvement. Hypertensive end-organ damage includes kidney disease.  Diabetes She presents for her follow-up diabetic visit. She has type 2 diabetes mellitus. Pertinent negatives for hypoglycemia include no dizziness, headaches, nervousness/anxiousness or tremors. Pertinent negatives for diabetes include no chest pain, no fatigue, no polydipsia and no polyuria. Symptoms are stable.  Hyperlipidemia This is a chronic problem. The problem is controlled. Pertinent negatives include no chest pain or shortness of breath. Current  antihyperlipidemic treatment includes statins. The current treatment provides moderate improvement of lipids.    Lab Results  Component Value Date   NA 132 (L) 03/06/2022   K 5.7 (H) 03/06/2022   CO2 20 (L) 03/06/2022   GLUCOSE 91 03/06/2022   BUN 22 03/06/2022   CREATININE 0.95 03/06/2022   CALCIUM 9.0 03/06/2022   EGFR 49 (L) 03/05/2022   GFRNONAA >60 03/06/2022   Lab Results  Component Value Date   CHOL 124 08/30/2021   HDL 50 08/30/2021   LDLCALC 55 08/30/2021   TRIG 103 08/30/2021   CHOLHDL 2.5 08/30/2021   Lab Results  Component Value Date   TSH 1.360 08/30/2021   Lab Results  Component Value Date   HGBA1C 7.5 (A) 08/06/2022   Lab Results  Component Value Date   WBC 12.4 (H) 08/30/2021   HGB 11.8 08/30/2021   HCT 36.5 08/30/2021   MCV 90 08/30/2021   PLT 356 08/30/2021   Lab Results  Component Value Date   ALT 24 08/30/2021   AST 18 08/30/2021   ALKPHOS 76 08/30/2021   BILITOT 0.5 08/30/2021   Lab Results  Component Value Date   VD25OH 38.6 08/30/2021     Review of Systems  Constitutional:  Negative for chills, fatigue and fever.  HENT:  Negative for congestion, hearing loss, tinnitus, trouble swallowing and voice change.   Eyes:  Negative for visual disturbance.  Respiratory:  Negative for cough, chest tightness, shortness of breath and wheezing.   Cardiovascular:  Negative for chest pain, palpitations and leg swelling.  Gastrointestinal:  Negative for abdominal pain, constipation, diarrhea and vomiting.  Endocrine: Negative for polydipsia and polyuria.  Genitourinary:  Negative for dysuria, frequency, genital sores, vaginal bleeding and vaginal discharge.  Musculoskeletal:  Negative for arthralgias, gait problem and joint swelling.  Skin:  Negative for color change  and rash.  Neurological:  Negative for dizziness, tremors, light-headedness and headaches.  Hematological:  Negative for adenopathy. Does not bruise/bleed easily.   Psychiatric/Behavioral:  Negative for dysphoric mood and sleep disturbance. The patient is not nervous/anxious.     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   CKD (chronic kidney disease) stage 3, GFR 30-59 ml/min (HCC) 04/30/2017   Type II diabetes mellitus with complication (HCC) 03/19/2017   Essential hypertension 03/19/2017   CAD (coronary artery disease) 03/19/2017   History of kidney stones 03/19/2017   Psoriasis 03/19/2017   Hyperlipidemia associated with type 2 diabetes mellitus (HCC) 03/19/2017   Nocturnal leg cramps 03/19/2017   Primary insomnia 03/19/2017   Allergic rhinitis 03/19/2017    Allergies  Allergen Reactions   Prednisone Other (See Comments)    Intolerance  - confusion     Past Surgical History:  Procedure Laterality Date   BREAST BIOPSY Right    neg   CATARACT EXTRACTION W/PHACO Left 09/27/2019   Procedure: CATARACT EXTRACTION PHACO AND INTRAOCULAR LENS PLACEMENT (IOC) LEFT DIABETIC ISTENT INJ INTRAVITREAL KENALOG INJ;  Surgeon: Nevada Crane, MD;  Location: [facility];  Service: Ophthalmology;  Laterality: Left;  2.83 0:28.7   CATARACT EXTRACTION W/PHACO Right 12/13/2019   Procedure: CATARACT EXTRACTION PHACO AND INTRAOCULAR LENS PLACEMENT (IOC) RIGHT ISTENT INJ KENALOG INJ DIABETIC;  Surgeon: Nevada Crane, MD;  Location: [facility];  Service: Ophthalmology;  Laterality: Right;  1.39 0:21.5   CESAREAN SECTION      Social History   Tobacco Use   Smoking status: Never   Smokeless tobacco: Never  Vaping Use   Vaping Use: Never used  Substance Use Topics   Alcohol use: Never   Drug use: Never     Medication list has been reviewed and updated.  Current  Meds  Medication Sig   ACCU-CHEK GUIDE test strip CHECK BLOOD SUGAR 3 TIMES DAILY   acetaminophen (TYLENOL) 500 MG tablet Take 1,000 mg by mouth every 8 (eight) hours as needed.   albuterol (VENTOLIN HFA) 108 (90 Base) MCG/ACT inhaler Inhale 2 puffs into the lungs every 6 (six) hours as needed for wheezing or shortness of breath.   Ascorbic Acid (VITAMIN C PO) Take by mouth.   aspirin EC 81 MG tablet Take 1 tablet (81 mg total) by mouth daily.   blood glucose meter kit and supplies Dispense based on patient and insurance preference. Use up to four times daily as directed. (FOR ICD-10 E10.9, E11.9).   clobetasol (TEMOVATE) 0.05 % external solution Apply topically 2 (two) times daily. To scalp lesions   clobetasol ointment (TEMOVATE) 0.05 % Apply 1 application topically 2 (two) times daily.   DUPIXENT 300 MG/2ML SOPN Inject into the skin every 14 (fourteen) days.   gabapentin (NEURONTIN) 100 MG capsule TAKE 1 CAPSULE BY MOUTH 3 TIMES  DAILY   glimepiride (AMARYL) 2 MG tablet TAKE 1 TABLET BY MOUTH DAILY  WITH BREAKFAST   hydrocortisone 2.5 % ointment Apply topically 2 (two) times daily. To lesion on arm and buttocks   insulin glargine (LANTUS) 100 UNIT/ML injection Inject 0.4 mLs (40 Units total) into the skin every morning. Pt taking QAM (Patient taking differently: Inject 27 Units into the skin every morning. Pt taking  QAM)   Insulin Syringe-Needle U-100 (BD INSULIN SYRINGE U/F) 30G X 1/2" 0.5 ML MISC USE WITH LANTUS INJECTIONS FOR  TYPE 2 DIABETES MELLITUS   meclizine (ANTIVERT) 12.5 MG tablet Take 1 tablet (12.5 mg total) by mouth 3 (three) times daily as needed for dizziness.   meloxicam (MOBIC) 7.5 MG tablet Take 1 tablet (7.5 mg total) by mouth daily.   montelukast (SINGULAIR) 10 MG tablet TAKE 1 TABLET BY MOUTH DAILY   mupirocin ointment (BACTROBAN) 2 % Apply 1 Application topically 3 (three) times daily.   nystatin (MYCOSTATIN) 100000 UNIT/ML suspension 1 teaspoon  swish, gargle and spit  four times per day.   Omega-3 Fatty Acids (FISH OIL) 1000 MG CAPS Take by mouth.   ONETOUCH DELICA PLUS LANCETS MISC 1 each by Does not apply route 2 (two) times daily.   OZEMPIC, 2 MG/DOSE, 8 MG/3ML SOPN Inject 2 mg into the skin once a week.   phenylephrine (NEO-SYNEPHRINE) 0.5 % nasal solution Place 1 drop into both nostrils every 6 (six) hours as needed for congestion. VICKS NASAL INHALER   traZODone (DESYREL) 50 MG tablet TAKE 1 TABLET BY MOUTH AT  BEDTIME   UNABLE TO FIND Med Name: Naltrexone 3 mg - Bud 180mg    vitamin B-12 (CYANOCOBALAMIN) 1000 MCG tablet Take 1,000 mcg by mouth daily.   [DISCONTINUED] atorvastatin (LIPITOR) 80 MG tablet TAKE 1 TABLET BY MOUTH ONCE  DAILY   [DISCONTINUED] metFORMIN (GLUCOPHAGE) 1000 MG tablet TAKE 1 TABLET BY MOUTH TWICE  DAILY WITH MEALS   [DISCONTINUED] metoprolol tartrate (LOPRESSOR) 50 MG tablet TAKE 1 TABLET BY MOUTH TWICE  DAILY   [DISCONTINUED] omeprazole (PRILOSEC) 20 MG capsule TAKE 1 CAPSULE BY MOUTH  DAILY       09/03/2022    8:42 AM 04/09/2022   11:17 AM 04/02/2022    9:04 AM 03/05/2022    8:32 AM  GAD 7 : Generalized Anxiety Score  Nervous, Anxious, on Edge 0 1 1 1   Control/stop worrying 0 0 0 0  Worry too much - different things 0 1 1 1   Trouble relaxing 0 1 1 1   Restless 0 1 1 1   Easily annoyed or irritable 0 0 0 1  Afraid - awful might happen 0 0 0 1  Total GAD 7 Score 0 4 4 6   Anxiety Difficulty Not difficult at all Not difficult at all Not difficult at all Somewhat difficult       09/03/2022    8:41 AM 05/08/2022    1:48 PM 04/09/2022   11:17 AM  Depression screen PHQ 2/9  Decreased Interest 2 0 0  Down, Depressed, Hopeless 2 0 0  PHQ - 2 Score 4 0 0  Altered sleeping 1 2 1   Tired, decreased energy 1 1 1   Change in appetite 0 1 1  Feeling bad or failure about yourself  0 0 0  Trouble concentrating 0 0 0  Moving slowly or fidgety/restless 0 1 1  Suicidal thoughts 0 0 0  PHQ-9 Score 6 5 4   Difficult doing work/chores Not  difficult at all Not difficult at all Not difficult at all    BP Readings from Last 3 Encounters:  09/03/22 122/70  08/06/22 122/70  04/02/22 (!) 90/54    Physical Exam Vitals and nursing note reviewed.  Constitutional:      General: She is not in acute distress.    Appearance: She is well-developed.  HENT:     Head: Normocephalic and atraumatic.  Right Ear: Tympanic membrane and ear canal normal.     Left Ear: Tympanic membrane and ear canal normal.     Nose:     Right Sinus: No maxillary sinus tenderness.     Left Sinus: No maxillary sinus tenderness.  Eyes:     General: No scleral icterus.       Right eye: No discharge.        Left eye: No discharge.     Conjunctiva/sclera: Conjunctivae normal.  Neck:     Thyroid: No thyromegaly.     Vascular: No carotid bruit.  Cardiovascular:     Rate and Rhythm: Normal rate and regular rhythm.     Pulses: Normal pulses.     Heart sounds: Normal heart sounds.  Pulmonary:     Effort: Pulmonary effort is normal. No respiratory distress.     Breath sounds: No wheezing.  Chest:  Breasts:    Right: No mass, nipple discharge, skin change or tenderness.     Left: No mass, nipple discharge, skin change or tenderness.  Abdominal:     General: Bowel sounds are normal.     Palpations: Abdomen is soft.     Tenderness: There is no abdominal tenderness.  Musculoskeletal:     Cervical back: Normal range of motion. No erythema.     Right lower leg: No edema.     Left lower leg: No edema.  Lymphadenopathy:     Cervical: No cervical adenopathy.  Skin:    General: Skin is warm and dry.     Findings: No rash.  Neurological:     Mental Status: She is alert and oriented to person, place, and time.     Cranial Nerves: No cranial nerve deficit.     Sensory: No sensory deficit.     Deep Tendon Reflexes: Reflexes are normal and symmetric.  Psychiatric:        Attention and Perception: Attention normal.        Mood and Affect: Mood normal.      Wt Readings from Last 3 Encounters:  09/03/22 136 lb 9.6 oz (62 kg)  08/06/22 137 lb (62.1 kg)  04/02/22 134 lb (60.8 kg)    BP 122/70   Pulse 91   Ht 4\' 11"  (1.499 m)   Wt 136 lb 9.6 oz (62 kg)   SpO2 99%   BMI 27.59 kg/m   Assessment and Plan:  Problem List Items Addressed This Visit     Type II diabetes mellitus with complication (HCC) (Chronic)    Blood sugars stable without hypoglycemic symptoms or events. Currently being treated with metformin and insulin. Followed by Endo Lab Results  Component Value Date   HGBA1C 7.5 (A) 08/06/2022        Relevant Medications   atorvastatin (LIPITOR) 80 MG tablet   metFORMIN (GLUCOPHAGE) 1000 MG tablet   Other Relevant Orders   Microalbumin / creatinine urine ratio   Hyperlipidemia associated with type 2 diabetes mellitus (HCC) (Chronic)    Tolerating statin medications.  No side effects noted. LDL is  Lab Results  Component Value Date   LDLCALC 55 08/30/2021  Atorvastatin 80 mg.       Relevant Medications   metoprolol tartrate (LOPRESSOR) 50 MG tablet   atorvastatin (LIPITOR) 80 MG tablet   metFORMIN (GLUCOPHAGE) 1000 MG tablet   Other Relevant Orders   Lipid panel   Essential hypertension (Chronic)    Stable exam with well controlled BP.  Currently taking metoprolol primarily for  palpitations.  Losartan stopped by Endo due to elevated K+. Tolerating medications without concerns or side effects. Will continue to recommend low sodium diet and current regimen.       Relevant Medications   metoprolol tartrate (LOPRESSOR) 50 MG tablet   atorvastatin (LIPITOR) 80 MG tablet   Other Relevant Orders   Comprehensive metabolic panel   CBC with Differential/Platelet   CKD (chronic kidney disease) stage 3, GFR 30-59 ml/min (HCC) (Chronic)    GFR stable around 50.      Relevant Orders   Comprehensive metabolic panel   CAD (coronary artery disease) (Chronic)   Relevant Medications   metoprolol tartrate  (LOPRESSOR) 50 MG tablet   atorvastatin (LIPITOR) 80 MG tablet   Other Visit Diagnoses     Annual physical exam    -  Primary   Encounter for screening mammogram for breast cancer       Relevant Orders   MM 3D SCREENING MAMMOGRAM BILATERAL BREAST   Long term current use of oral hypoglycemic drug       Gastroesophageal reflux disease, unspecified whether esophagitis present       Relevant Medications   omeprazole (PRILOSEC) 20 MG capsule   Hyperlipidemia, unspecified hyperlipidemia type       Relevant Medications   metoprolol tartrate (LOPRESSOR) 50 MG tablet   atorvastatin (LIPITOR) 80 MG tablet   Need for vaccination for pneumococcus       Relevant Orders   Pneumococcal conjugate vaccine 20-valent (Completed)       Return in about 4 months (around 01/04/2023) for DM.   Partially dictated using Dragon software, any errors are not intentional.  Reubin Milan, MD [facility] [place], [place]

## 2022-09-03 NOTE — Assessment & Plan Note (Signed)
Tolerating statin medications.  No side effects noted. LDL is  Lab Results  Component Value Date   LDLCALC 55 08/30/2021  Atorvastatin 80 mg.

## 2022-09-04 LAB — COMPREHENSIVE METABOLIC PANEL
ALT: 17 IU/L (ref 0–32)
AST: 17 IU/L (ref 0–40)
Albumin: 4.4 g/dL (ref 3.9–4.9)
Alkaline Phosphatase: 107 IU/L (ref 44–121)
BUN/Creatinine Ratio: 22 (ref 12–28)
BUN: 20 mg/dL (ref 8–27)
Bilirubin Total: 0.4 mg/dL (ref 0.0–1.2)
CO2: 22 mmol/L (ref 20–29)
Calcium: 9.9 mg/dL (ref 8.7–10.3)
Chloride: 104 mmol/L (ref 96–106)
Creatinine, Ser: 0.9 mg/dL (ref 0.57–1.00)
Globulin, Total: 2.9 g/dL (ref 1.5–4.5)
Glucose: 165 mg/dL — ABNORMAL HIGH (ref 70–99)
Potassium: 5.8 mmol/L — ABNORMAL HIGH (ref 3.5–5.2)
Sodium: 141 mmol/L (ref 134–144)
Total Protein: 7.3 g/dL (ref 6.0–8.5)
eGFR: 69 mL/min/{1.73_m2} (ref >59)

## 2022-09-04 LAB — LIPID PANEL
Chol/HDL Ratio: 2.5 ratio (ref 0.0–4.4)
Cholesterol, Total: 117 mg/dL (ref 100–199)
HDL: 47 mg/dL (ref >39)
LDL Chol Calc (NIH): 46 mg/dL (ref 0–99)
Triglycerides: 139 mg/dL (ref 0–149)
VLDL Cholesterol Cal: 24 mg/dL (ref 5–40)

## 2022-09-04 LAB — CBC WITH DIFFERENTIAL/PLATELET
Basophils Absolute: 0.1 10*3/uL (ref 0.0–0.2)
Basos: 1 %
EOS (ABSOLUTE): 0.6 10*3/uL — ABNORMAL HIGH (ref 0.0–0.4)
Eos: 7 %
Hematocrit: 37.7 % (ref 34.0–46.6)
Hemoglobin: 11.8 g/dL (ref 11.1–15.9)
Immature Grans (Abs): 0 10*3/uL (ref 0.0–0.1)
Immature Granulocytes: 0 %
Lymphocytes Absolute: 2.1 10*3/uL (ref 0.7–3.1)
Lymphs: 24 %
MCH: 28 pg (ref 26.6–33.0)
MCHC: 31.3 g/dL — ABNORMAL LOW (ref 31.5–35.7)
MCV: 89 fL (ref 79–97)
Monocytes Absolute: 0.6 10*3/uL (ref 0.1–0.9)
Monocytes: 7 %
Neutrophils Absolute: 5.3 10*3/uL (ref 1.4–7.0)
Neutrophils: 61 %
Platelets: 401 10*3/uL (ref 150–450)
RBC: 4.22 x10E6/uL (ref 3.77–5.28)
RDW: 13.4 % (ref 11.7–15.4)
WBC: 8.7 10*3/uL (ref 3.4–10.8)

## 2022-09-04 LAB — MICROALBUMIN / CREATININE URINE RATIO
Creatinine, Urine: 330.6 mg/dL
Microalb/Creat Ratio: 362 mg/g creat — ABNORMAL HIGH (ref 0–29)
Microalbumin, Urine: 1195.5 ug/mL

## 2022-09-20 DIAGNOSIS — E1142 Type 2 diabetes mellitus with diabetic polyneuropathy: Secondary | ICD-10-CM | POA: Diagnosis not present

## 2022-09-20 DIAGNOSIS — I152 Hypertension secondary to endocrine disorders: Secondary | ICD-10-CM | POA: Diagnosis not present

## 2022-09-20 DIAGNOSIS — Z794 Long term (current) use of insulin: Secondary | ICD-10-CM | POA: Diagnosis not present

## 2022-09-20 DIAGNOSIS — E1159 Type 2 diabetes mellitus with other circulatory complications: Secondary | ICD-10-CM | POA: Diagnosis not present

## 2022-09-20 DIAGNOSIS — E785 Hyperlipidemia, unspecified: Secondary | ICD-10-CM | POA: Diagnosis not present

## 2022-09-20 DIAGNOSIS — E1169 Type 2 diabetes mellitus with other specified complication: Secondary | ICD-10-CM | POA: Diagnosis not present

## 2022-09-20 LAB — HEMOGLOBIN A1C: Hemoglobin A1C: 7.5

## 2022-10-01 ENCOUNTER — Encounter: Payer: Self-pay | Admitting: Internal Medicine

## 2022-11-04 ENCOUNTER — Other Ambulatory Visit: Payer: Self-pay | Admitting: Internal Medicine

## 2022-11-04 ENCOUNTER — Ambulatory Visit
Admission: RE | Admit: 2022-11-04 | Discharge: 2022-11-04 | Disposition: A | Discharge status: Home / Self Care | Payer: Medicare Other | Source: Ambulatory Visit | Attending: Internal Medicine | Admitting: Internal Medicine

## 2022-11-04 DIAGNOSIS — K219 Gastro-esophageal reflux disease without esophagitis: Secondary | ICD-10-CM

## 2022-11-04 DIAGNOSIS — Z1231 Encounter for screening mammogram for malignant neoplasm of breast: Secondary | ICD-10-CM | POA: Insufficient documentation

## 2022-12-04 DIAGNOSIS — E113213 Type 2 diabetes mellitus with mild nonproliferative diabetic retinopathy with macular edema, bilateral: Secondary | ICD-10-CM | POA: Diagnosis not present

## 2022-12-04 DIAGNOSIS — H401121 Primary open-angle glaucoma, left eye, mild stage: Secondary | ICD-10-CM | POA: Diagnosis not present

## 2022-12-04 DIAGNOSIS — H401111 Primary open-angle glaucoma, right eye, mild stage: Secondary | ICD-10-CM | POA: Diagnosis not present

## 2022-12-04 DIAGNOSIS — Z961 Presence of intraocular lens: Secondary | ICD-10-CM | POA: Diagnosis not present

## 2022-12-04 LAB — HM DIABETES EYE EXAM

## 2022-12-06 ENCOUNTER — Encounter: Payer: Self-pay | Admitting: Internal Medicine

## 2022-12-10 ENCOUNTER — Ambulatory Visit: Admit: 2022-12-10 | Discharge: 2022-12-11 | Payer: MEDICARE | Attending: Dermatology | Primary: Dermatology

## 2022-12-10 DIAGNOSIS — L281 Prurigo nodularis: Secondary | ICD-10-CM | POA: Diagnosis not present

## 2022-12-13 ENCOUNTER — Ambulatory Visit: Payer: Medicare Other | Admitting: Internal Medicine

## 2022-12-13 ENCOUNTER — Telehealth: Payer: Self-pay | Admitting: Internal Medicine

## 2022-12-13 NOTE — Telephone Encounter (Signed)
Pt husband called in requesting an appointment for today. Stated pt is having a lot of itching on her back. Husband stated it was a rash, then said it wasn't; she was just very itchy going on for a week. Scheduled appointment per request for today, then he stated pt did not want to come in and ask that I cancel the appointment.  FYI to the office.

## 2023-01-06 ENCOUNTER — Ambulatory Visit (INDEPENDENT_AMBULATORY_CARE_PROVIDER_SITE_OTHER): Payer: Medicare Other | Admitting: Internal Medicine

## 2023-01-06 ENCOUNTER — Encounter: Payer: Self-pay | Admitting: Internal Medicine

## 2023-01-06 VITALS — BP 98/68 | HR 98 | Ht 59.0 in | Wt 135.0 lb

## 2023-01-06 DIAGNOSIS — E118 Type 2 diabetes mellitus with unspecified complications: Secondary | ICD-10-CM | POA: Diagnosis not present

## 2023-01-06 DIAGNOSIS — Z7984 Long term (current) use of oral hypoglycemic drugs: Secondary | ICD-10-CM

## 2023-01-06 DIAGNOSIS — L281 Prurigo nodularis: Secondary | ICD-10-CM | POA: Diagnosis not present

## 2023-01-06 DIAGNOSIS — I10 Essential (primary) hypertension: Secondary | ICD-10-CM

## 2023-01-06 LAB — POCT GLYCOSYLATED HEMOGLOBIN (HGB A1C): Hemoglobin A1C: 7.4 % — AB (ref 4.0–5.6)

## 2023-01-06 NOTE — Assessment & Plan Note (Addendum)
Blood sugars stable without hypoglycemic symptoms or events. Currently managed with metformin, Lantus, Amaryl and ozempic. Changes made last visit are none.  Followed by Endo. Lab Results  Component Value Date   HGBA1C 7.5 09/20/2022  A1C today = 7.4. Continue same regimen.

## 2023-01-06 NOTE — Assessment & Plan Note (Signed)
 Controlled BP with normal exam. Current regimen is metoprolol. Will continue same medications; encourage continued reduced sodium diet.

## 2023-01-06 NOTE — Progress Notes (Signed)
Date:  01/06/2023   Name:  Elizabeth Klein   DOB:  Oct 29, 1950   MRN:  [id number]   Chief Complaint: Diabetes  Diabetes She presents for her follow-up diabetic visit. She has type 2 diabetes mellitus. Her disease course has been stable. Pertinent negatives for hypoglycemia include no dizziness or headaches. Pertinent negatives for diabetes include no chest pain, no fatigue and no weakness. Symptoms are stable. Current diabetic treatments: glimepiride, Lantus, metformin, Ozempic.  Hypertension This is a chronic problem. The problem is controlled. Pertinent negatives include no chest pain, headaches, palpitations or shortness of breath. Past treatments include beta blockers. The current treatment provides significant improvement.    Review of Systems  Constitutional:  Negative for fatigue and unexpected weight change.  HENT:  Negative for nosebleeds.   Eyes:  Negative for visual disturbance.  Respiratory:  Negative for cough, chest tightness, shortness of breath and wheezing.   Cardiovascular:  Negative for chest pain, palpitations and leg swelling.  Gastrointestinal:  Negative for abdominal pain, constipation and diarrhea.  Neurological:  Negative for dizziness, weakness, light-headedness and headaches.     Lab Results  Component Value Date   NA 141 09/03/2022   K 5.8 (H) 09/03/2022   CO2 22 09/03/2022   GLUCOSE 165 (H) 09/03/2022   BUN 20 09/03/2022   CREATININE 0.90 09/03/2022   CALCIUM 9.9 09/03/2022   EGFR 69 09/03/2022   GFRNONAA >60 03/06/2022   Lab Results  Component Value Date   CHOL 117 09/03/2022   HDL 47 09/03/2022   LDLCALC 46 09/03/2022   TRIG 139 09/03/2022   CHOLHDL 2.5 09/03/2022   Lab Results  Component Value Date   TSH 1.360 08/30/2021   Lab Results  Component Value Date   HGBA1C 7.4 (A) 01/06/2023   Lab Results  Component Value Date   WBC 8.7 09/03/2022   HGB 11.8 09/03/2022   HCT 37.7 09/03/2022   MCV 89 09/03/2022   PLT 401 09/03/2022    Lab Results  Component Value Date   ALT 17 09/03/2022   AST 17 09/03/2022   ALKPHOS 107 09/03/2022   BILITOT 0.4 09/03/2022   Lab Results  Component Value Date   VD25OH 38.6 08/30/2021     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   CKD (chronic kidney disease) stage 3, GFR 30-59 ml/min (HCC) 04/30/2017   Type II diabetes mellitus with complication (HCC) 03/19/2017   Essential hypertension 03/19/2017   CAD (coronary artery disease) 03/19/2017   History of kidney stones 03/19/2017   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   Hyperlipidemia associated with type 2 diabetes mellitus (HCC) 03/19/2017   Nocturnal leg cramps 03/19/2017   Primary insomnia 03/19/2017   Allergic rhinitis 03/19/2017    Allergies  Allergen Reactions   Prednisone Other (See Comments)    Intolerance  - confusion     Past Surgical History:  Procedure Laterality Date   BREAST BIOPSY Right    neg   CATARACT EXTRACTION W/PHACO Left 09/27/2019   Procedure: CATARACT EXTRACTION PHACO AND INTRAOCULAR LENS PLACEMENT (IOC) LEFT DIABETIC ISTENT INJ INTRAVITREAL KENALOG INJ;  Surgeon: Nevada Crane, MD;  Location: [facility];  Service: Ophthalmology;  Laterality: Left;  2.83 0:28.7   CATARACT EXTRACTION W/PHACO Right 12/13/2019  Procedure: CATARACT EXTRACTION PHACO AND INTRAOCULAR LENS PLACEMENT (IOC) RIGHT ISTENT INJ KENALOG INJ DIABETIC;  Surgeon: Nevada Crane, MD;  Location: [facility];  Service: Ophthalmology;  Laterality: Right;  1.39 0:21.5   CESAREAN SECTION      Social History   Tobacco Use   Smoking status: Never   Smokeless tobacco: Never  Vaping Use   Vaping status: Never Used  Substance Use Topics   Alcohol  use: Never   Drug use: Never     Medication list has been reviewed and updated.  Current Meds  Medication Sig   ACCU-CHEK GUIDE test strip CHECK BLOOD SUGAR 3 TIMES DAILY   acetaminophen (TYLENOL) 500 MG tablet Take 1,000 mg by mouth every 8 (eight) hours as needed.   albuterol (VENTOLIN HFA) 108 (90 Base) MCG/ACT inhaler Inhale 2 puffs into the lungs every 6 (six) hours as needed for wheezing or shortness of breath.   Ascorbic Acid (VITAMIN C PO) Take by mouth.   aspirin EC 81 MG tablet Take 1 tablet (81 mg total) by mouth daily.   atorvastatin (LIPITOR) 80 MG tablet Take 1 tablet (80 mg total) by mouth daily.   blood glucose meter kit and supplies Dispense based on patient and insurance preference. Use up to four times daily as directed. (FOR ICD-10 E10.9, E11.9).   clobetasol (TEMOVATE) 0.05 % external solution Apply topically 2 (two) times daily. To scalp lesions   clobetasol ointment (TEMOVATE) 0.05 % Apply 1 application topically 2 (two) times daily.   DUPIXENT 300 MG/2ML SOPN Inject into the skin every 14 (fourteen) days.   gabapentin (NEURONTIN) 100 MG capsule TAKE 1 CAPSULE BY MOUTH 3 TIMES  DAILY   glimepiride (AMARYL) 2 MG tablet TAKE 1 TABLET BY MOUTH DAILY  WITH BREAKFAST   hydrocortisone 2.5 % ointment Apply topically 2 (two) times daily. To lesion on arm and buttocks   insulin glargine (LANTUS) 100 UNIT/ML injection Inject 0.4 mLs (40 Units total) into the skin every morning. Pt taking QAM (Patient taking differently: Inject 27 Units into the skin every morning. Pt taking QAM)   Insulin Syringe-Needle U-100 (BD INSULIN SYRINGE U/F) 30G X 1/2" 0.5 ML MISC USE WITH LANTUS INJECTIONS FOR  TYPE 2 DIABETES MELLITUS   meclizine (ANTIVERT) 12.5 MG tablet Take 1 tablet (12.5 mg total) by mouth 3 (three) times daily as needed for dizziness.   meloxicam (MOBIC) 7.5 MG tablet Take 1 tablet (7.5 mg total) by mouth daily.   metFORMIN (GLUCOPHAGE) 1000 MG tablet Take 1 tablet (1,000 mg  total) by mouth 2 (two) times daily with a meal.   metoprolol tartrate (LOPRESSOR) 50 MG tablet Take 1 tablet (50 mg total) by mouth 2 (two) times daily.   montelukast (SINGULAIR) 10 MG tablet TAKE 1 TABLET BY MOUTH DAILY   mupirocin ointment (BACTROBAN) 2 % Apply 1 Application topically 3 (three) times daily.   nystatin (MYCOSTATIN) 100000 UNIT/ML suspension 1 teaspoon  swish, gargle and spit four times per day.   Omega-3 Fatty Acids (FISH OIL) 1000 MG CAPS Take by mouth.   omeprazole (PRILOSEC) 20 MG capsule TAKE 1 CAPSULE BY MOUTH DAILY   ONETOUCH DELICA PLUS LANCETS MISC 1 each by Does not apply route 2 (two) times daily.   OZEMPIC, 2 MG/DOSE, 8 MG/3ML SOPN Inject 2 mg into the skin once a week.   phenylephrine (NEO-SYNEPHRINE) 0.5 % nasal solution Place 1 drop into both nostrils every 6 (six) hours as needed for congestion. VICKS NASAL INHALER  traZODone (DESYREL) 50 MG tablet TAKE 1 TABLET BY MOUTH AT  BEDTIME   UNABLE TO FIND Med Name: Naltrexone 3 mg - Bud 180mg    vitamin B-12 (CYANOCOBALAMIN) 1000 MCG tablet Take 1,000 mcg by mouth daily.       01/06/2023    2:27 PM 09/03/2022    8:42 AM 04/09/2022   11:17 AM 04/02/2022    9:04 AM  GAD 7 : Generalized Anxiety Score  Nervous, Anxious, on Edge 0 0 1 1  Control/stop worrying 0 0 0 0  Worry too much - different things 0 0 1 1  Trouble relaxing 0 0 1 1  Restless 0 0 1 1  Easily annoyed or irritable 0 0 0 0  Afraid - awful might happen 0 0 0 0  Total GAD 7 Score 0 0 4 4  Anxiety Difficulty Not difficult at all Not difficult at all Not difficult at all Not difficult at all       01/06/2023    2:27 PM 09/03/2022    8:41 AM 05/08/2022    1:48 PM  Depression screen PHQ 2/9  Decreased Interest 0 2 0  Down, Depressed, Hopeless 0 2 0  PHQ - 2 Score 0 4 0  Altered sleeping 1 1 2   Tired, decreased energy 0 1 1  Change in appetite 0 0 1  Feeling bad or failure about yourself  0 0 0  Trouble concentrating 0 0 0  Moving slowly or  fidgety/restless 0 0 1  Suicidal thoughts 0 0 0  PHQ-9 Score 1 6 5   Difficult doing work/chores Not difficult at all Not difficult at all Not difficult at all    BP Readings from Last 3 Encounters:  01/06/23 98/68  09/03/22 122/70  08/06/22 122/70    Physical Exam Vitals and nursing note reviewed.  Constitutional:      General: She is not in acute distress.    Appearance: She is well-developed.  HENT:     Head: Normocephalic and atraumatic.  Cardiovascular:     Rate and Rhythm: Normal rate and regular rhythm.     Pulses: Normal pulses.     Heart sounds: No murmur heard. Pulmonary:     Effort: Pulmonary effort is normal. No respiratory distress.     Breath sounds: No wheezing or rhonchi.  Musculoskeletal:     Cervical back: Normal range of motion.     Right lower leg: No edema.     Left lower leg: No edema.  Lymphadenopathy:     Cervical: No cervical adenopathy.  Skin:    General: Skin is warm and dry.     Capillary Refill: Capillary refill takes less than 2 seconds.     Findings: Rash present.  Neurological:     Mental Status: She is alert and oriented to person, place, and time.  Psychiatric:        Mood and Affect: Mood normal.        Behavior: Behavior normal.     Wt Readings from Last 3 Encounters:  01/06/23 135 lb (61.2 kg)  09/03/22 136 lb 9.6 oz (62 kg)  08/06/22 137 lb (62.1 kg)    BP 98/68   Pulse 98   Ht 4\' 11"  (1.499 m)   Wt 135 lb (61.2 kg)   SpO2 98%   BMI 27.27 kg/m   Assessment and Plan:  Problem List Items Addressed This Visit       Unprioritized   Type II diabetes mellitus with  complication (HCC) (Chronic)    Blood sugars stable without hypoglycemic symptoms or events. Currently managed with metformin, Lantus, Amaryl and ozempic. Changes made last visit are none.  Followed by Endo. Lab Results  Component Value Date   HGBA1C 7.5 09/20/2022  A1C today = 7.4. Continue same regimen.       Relevant Orders   POCT glycosylated  hemoglobin (Hb A1C) (Completed)   Essential hypertension - Primary (Chronic)    Controlled BP with normal exam. Current regimen is metoprolol. Will continue same medications; encourage continued reduced sodium diet.       Prurigo nodularis    Chronic skin changes - followed by [facility]      Other Visit Diagnoses     Long term current use of oral hypoglycemic drug           Return in about 4 months (around 05/06/2023) for DM, HTN.    Reubin Milan, MD [facility] [place]

## 2023-01-06 NOTE — Assessment & Plan Note (Signed)
Chronic skin changes - followed by [facility]

## 2023-01-20 ENCOUNTER — Other Ambulatory Visit: Payer: Self-pay | Admitting: Internal Medicine

## 2023-01-20 DIAGNOSIS — E118 Type 2 diabetes mellitus with unspecified complications: Secondary | ICD-10-CM

## 2023-01-21 NOTE — Telephone Encounter (Signed)
Requested Prescriptions  Refused Prescriptions Disp Refills   metFORMIN (GLUCOPHAGE) 1000 MG tablet [Pharmacy Med Name: metFORMIN HCl 1000 MG Oral Tablet] 200 tablet 2    Sig: TAKE 1 TABLET BY MOUTH TWICE  DAILY WITH MEALS     Endocrinology:  Diabetes - Biguanides Passed - 01/20/2023  4:08 AM      Passed - Cr in normal range and within 360 days    Creatinine, Ser  Date Value Ref Range Status  09/03/2022 0.90 0.57 - 1.00 mg/dL Final         Passed - HBA1C is between 0 and 7.9 and within 180 days    Hemoglobin A1C  Date Value Ref Range Status  01/06/2023 7.4 (A) 4.0 - 5.6 % Final  09/20/2022 7.5  Final         Passed - eGFR in normal range and within 360 days    GFR calc Af Amer  Date Value Ref Range Status  08/23/2019 >60 >60 mL/min Final   GFR, Estimated  Date Value Ref Range Status  03/06/2022 >60 >60 mL/min Final    Comment:    (NOTE) Calculated using the CKD-EPI Creatinine Equation (2021)    eGFR  Date Value Ref Range Status  09/03/2022 69 >59 mL/min/1.73 Final         Passed - B12 Level in normal range and within 720 days    Vitamin B-12  Date Value Ref Range Status  11/16/2021 378  Final         Passed - Valid encounter within last 6 months    Recent Outpatient Visits           2 weeks ago Essential hypertension   [place] [facility] at [facility], Nyoka Cowden, MD   4 months ago Annual physical exam   [facility] at [facility], Nyoka Cowden, MD   5 months ago Nasal congestion   [place] [facility] at [facility], Nyoka Cowden, MD   9 months ago COVID-19 virus infection   [place] [facility] at [facility], Nyoka Cowden, MD   9 months ago Upper respiratory tract infection, unspecified type   [facility] at [facility], Nyoka Cowden, MD       Future Appointments              In 3 months Judithann Graves Nyoka Cowden, MD [facility] at [facility]            Passed - CBC within normal limits and completed in the last 12 months    WBC  Date Value Ref Range Status  09/03/2022 8.7 3.4 - 10.8 x10E3/uL Final  04/20/2021 14.1 (H) 4.0 - 10.5 K/uL Final   RBC  Date Value Ref Range Status  09/03/2022 4.22 3.77 - 5.28 x10E6/uL Final  04/20/2021 4.22 3.87 - 5.11 MIL/uL Final   Hemoglobin  Date Value Ref Range Status  09/03/2022 11.8 11.1 - 15.9 g/dL Final   Hematocrit  Date Value Ref Range Status  09/03/2022 37.7 34.0 - 46.6 % Final   MCHC  Date Value Ref Range Status  09/03/2022 31.3 (L) 31.5 - 35.7 g/dL Final  57/84/6962 95.2 30.0 - 36.0 g/dL Final   [facility]  Date Value Ref Range Status  09/03/2022 28.0 26.6 - 33.0 pg Final  04/20/2021 28.2 26.0 -  34.0 pg Final   MCV  Date Value Ref Range Status  09/03/2022 89 79 - 97 fL Final   No results found for: "PLTCOUNTKUC", "LABPLAT", "POCPLA" RDW  Date Value Ref Range Status  09/03/2022 13.4 11.7 - 15.4 % Final

## 2023-02-06 ENCOUNTER — Ambulatory Visit: Payer: Medicare Other

## 2023-02-10 ENCOUNTER — Ambulatory Visit (INDEPENDENT_AMBULATORY_CARE_PROVIDER_SITE_OTHER): Payer: Medicare Other | Admitting: Internal Medicine

## 2023-02-10 ENCOUNTER — Ambulatory Visit: Payer: Self-pay | Admitting: *Deleted

## 2023-02-10 ENCOUNTER — Encounter: Payer: Self-pay | Admitting: Internal Medicine

## 2023-02-10 VITALS — BP 104/76 | HR 109 | Ht 59.0 in | Wt 136.0 lb

## 2023-02-10 DIAGNOSIS — E118 Type 2 diabetes mellitus with unspecified complications: Secondary | ICD-10-CM

## 2023-02-10 DIAGNOSIS — L409 Psoriasis, unspecified: Secondary | ICD-10-CM | POA: Insufficient documentation

## 2023-02-10 DIAGNOSIS — Z7984 Long term (current) use of oral hypoglycemic drugs: Secondary | ICD-10-CM

## 2023-02-10 DIAGNOSIS — G43009 Migraine without aura, not intractable, without status migrainosus: Secondary | ICD-10-CM | POA: Diagnosis not present

## 2023-02-10 MED ORDER — CLOBETASOL PROPIONATE 0.05 % EX SOLN
Freq: Two times a day (BID) | CUTANEOUS | 0 refills | Status: DC
Start: 2023-02-10 — End: 2023-06-16

## 2023-02-10 MED ORDER — EMPAGLIFLOZIN 25 MG PO TABS: 25.0000 mg | ORAL_TABLET | Freq: Every day | ORAL | 1 refills | Status: DC

## 2023-02-10 NOTE — Assessment & Plan Note (Addendum)
Had proteinuria last July and had discontinued losartan due to low BP. BP still low.  Will add Jardiance 25 mg daily. She has follow up with Endo in January.  No change in other medications unless glucoses drop.

## 2023-02-10 NOTE — Assessment & Plan Note (Signed)
Apply clobetasol solution to scalp lesions

## 2023-02-10 NOTE — Assessment & Plan Note (Signed)
Hx of migraines when younger Now having mild HA with n/v not relieved by Tylenol. Will give sample of Nurtec to take daily as needed.

## 2023-02-10 NOTE — Telephone Encounter (Signed)
Summary: Headache, no appt soon enough   Headache, no appt soon enough, seeking appt tomorrow.     Reason for Disposition  [1] MODERATE headache (e.g., interferes with normal activities) AND [2] present > 24 hours AND [3] unexplained  (Exceptions: analgesics not tried, typical migraine, or headache part of viral illness)  Answer Assessment - Initial Assessment Questions 1. LOCATION: "Where does it hurt?"     Top- Middle to the side and front 2. ONSET: "When did the headache start?" (Minutes, hours or days)      4 days 3. PATTERN: "Does the pain come and go, or has it been constant since it started?"     Comes and goes - using Tylenol 4. SEVERITY: "How bad is the pain?" and "What does it keep you from doing?"  (e.g., Scale 1-10; mild, moderate, or severe)   - MILD (1-3): doesn't interfere with normal activities    - MODERATE (4-7): interferes with normal activities or awakens from sleep    - SEVERE (8-10): excruciating pain, unable to do any normal activities        moderate 5. RECURRENT SYMPTOM: "Have you ever had headaches before?" If Yes, ask: "When was the last time?" and "What happened that time?"      Yes- hx migraine  6. CAUSE: "What do you think is causing the headache?"     unsure 7. MIGRAINE: "Have you been diagnosed with migraine headaches?" If Yes, ask: "Is this headache similar?"      Yes- not as bad 8. HEAD INJURY: "Has there been any recent injury to the head?"      no 9. OTHER SYMPTOMS: "Do you have any other symptoms?" (fever, stiff neck, eye pain, sore throat, cold symptoms)     Neck pain with headache  Protocols used: Headache-A-AH

## 2023-02-10 NOTE — Patient Instructions (Signed)
Take Nurtec 75 mg - one tablet daily as needed for headache.

## 2023-02-10 NOTE — Progress Notes (Signed)
Date:  02/10/2023   Name:  Elizabeth Klein   DOB:  13-Feb-1951   MRN:  [id number]   Chief Complaint: Headache  Migraine  This is a recurrent problem. Episode onset: has not had a migraine in a long time. The problem occurs constantly. The problem has been gradually worsening. The pain is located in the Occipital region. The pain quality is similar to prior headaches. Associated symptoms include nausea and vomiting. Pertinent negatives include no coughing, dizziness, fever, numbness or weakness.  Diabetes She presents for her follow-up diabetic visit. She has type 2 diabetes mellitus. Hypoglycemia symptoms include headaches. Pertinent negatives for hypoglycemia include no dizziness, nervousness/anxiousness or tremors. Pertinent negatives for diabetes include no chest pain, no fatigue and no weakness.  Rash - recurrent rash - psoriasis vs prurigo nodularis.  Seeing Dermatology.  Will refill clobetasol solution for scalp.  Review of Systems  Constitutional:  Negative for chills, fatigue and fever.  Respiratory:  Positive for shortness of breath. Negative for cough, chest tightness and wheezing.   Cardiovascular:  Negative for chest pain and palpitations.  Gastrointestinal:  Positive for nausea and vomiting.  Musculoskeletal:  Negative for arthralgias.  Skin:  Positive for color change and rash.  Neurological:  Positive for headaches. Negative for dizziness, tremors, weakness, light-headedness and numbness.  Psychiatric/Behavioral:  Negative for dysphoric mood and sleep disturbance. The patient is not nervous/anxious.      Lab Results  Component Value Date   NA 141 09/03/2022   K 5.8 (H) 09/03/2022   CO2 22 09/03/2022   GLUCOSE 165 (H) 09/03/2022   BUN 20 09/03/2022   CREATININE 0.90 09/03/2022   CALCIUM 9.9 09/03/2022   EGFR 69 09/03/2022   GFRNONAA >60 03/06/2022   Lab Results  Component Value Date   CHOL 117 09/03/2022   HDL 47 09/03/2022   LDLCALC 46 09/03/2022   TRIG  139 09/03/2022   CHOLHDL 2.5 09/03/2022   Lab Results  Component Value Date   TSH 1.360 08/30/2021   Lab Results  Component Value Date   HGBA1C 7.4 (A) 01/06/2023   Lab Results  Component Value Date   WBC 8.7 09/03/2022   HGB 11.8 09/03/2022   HCT 37.7 09/03/2022   MCV 89 09/03/2022   PLT 401 09/03/2022   Lab Results  Component Value Date   ALT 17 09/03/2022   AST 17 09/03/2022   ALKPHOS 107 09/03/2022   BILITOT 0.4 09/03/2022   Lab Results  Component Value Date   VD25OH 38.6 08/30/2021     Patient Active Problem List   Diagnosis Date Noted   Migraine without aura and without status migrainosus, not intractable 02/10/2023   Psoriasis 02/10/2023   Impingement syndrome of shoulder, right 07/09/2021   Cervical paraspinal muscle spasm 07/09/2021   Degeneration of lumbar intervertebral disc 04/19/2021   Oropharyngeal dysphagia 02/23/2019   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   CKD (chronic kidney disease) stage 3, GFR 30-59 ml/min (HCC) 04/30/2017   Type II diabetes mellitus with complication (HCC) 03/19/2017   Essential hypertension 03/19/2017   CAD (coronary artery disease) 03/19/2017   History of kidney stones 03/19/2017   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   Hyperlipidemia associated with type 2 diabetes mellitus (HCC) 03/19/2017   Nocturnal leg cramps 03/19/2017   Primary insomnia 03/19/2017   Allergic rhinitis 03/19/2017    Allergies  Allergen Reactions   Prednisone Other (See Comments)    Intolerance  - confusion     Past Surgical  History:  Procedure Laterality Date   BREAST BIOPSY Right    neg   CATARACT EXTRACTION W/PHACO Left 09/27/2019   Procedure: CATARACT EXTRACTION PHACO AND INTRAOCULAR LENS PLACEMENT (IOC) LEFT DIABETIC ISTENT INJ INTRAVITREAL KENALOG INJ;  Surgeon: Nevada Crane, MD;  Location: [facility];  Service: Ophthalmology;  Laterality: Left;  2.83 0:28.7   CATARACT EXTRACTION W/PHACO Right 12/13/2019    Procedure: CATARACT EXTRACTION PHACO AND INTRAOCULAR LENS PLACEMENT (IOC) RIGHT ISTENT INJ KENALOG INJ DIABETIC;  Surgeon: Nevada Crane, MD;  Location: [facility];  Service: Ophthalmology;  Laterality: Right;  1.39 0:21.5   CESAREAN SECTION      Social History   Tobacco Use   Smoking status: Never   Smokeless tobacco: Never  Vaping Use   Vaping status: Never Used  Substance Use Topics   Alcohol use: Never   Drug use: Never     Medication list has been reviewed and updated.  Current Meds  Medication Sig   ACCU-CHEK GUIDE test strip CHECK BLOOD SUGAR 3 TIMES DAILY   acetaminophen (TYLENOL) 500 MG tablet Take 1,000 mg by mouth every 8 (eight) hours as needed.   albuterol (VENTOLIN HFA) 108 (90 Base) MCG/ACT inhaler Inhale 2 puffs into the lungs every 6 (six) hours as needed for wheezing or shortness of breath.   Ascorbic Acid (VITAMIN C PO) Take by mouth.   aspirin EC 81 MG tablet Take 1 tablet (81 mg total) by mouth daily.   atorvastatin (LIPITOR) 80 MG tablet Take 1 tablet (80 mg total) by mouth daily.   blood glucose meter kit and supplies Dispense based on patient and insurance preference. Use up to four times daily as directed. (FOR ICD-10 E10.9, E11.9).   clobetasol ointment (TEMOVATE) 0.05 % Apply 1 application topically 2 (two) times daily.   DUPIXENT 300 MG/2ML SOPN Inject into the skin every 14 (fourteen) days.   empagliflozin (JARDIANCE) 25 MG TABS tablet Take 1 tablet (25 mg total) by mouth daily before breakfast.   gabapentin (NEURONTIN) 100 MG capsule TAKE 1 CAPSULE BY MOUTH 3 TIMES  DAILY   glimepiride (AMARYL) 2 MG tablet TAKE 1 TABLET BY MOUTH DAILY  WITH BREAKFAST   hydrocortisone 2.5 % ointment Apply topically 2 (two) times daily. To lesion on arm and buttocks   insulin glargine (LANTUS) 100 UNIT/ML injection Inject 0.4 mLs (40 Units total) into the skin every morning. Pt taking QAM (Patient taking differently: Inject 27 Units into the skin every  morning. Pt taking QAM)   Insulin Syringe-Needle U-100 (BD INSULIN SYRINGE U/F) 30G X 1/2" 0.5 ML MISC USE WITH LANTUS INJECTIONS FOR  TYPE 2 DIABETES MELLITUS   meclizine (ANTIVERT) 12.5 MG tablet Take 1 tablet (12.5 mg total) by mouth 3 (three) times daily as needed for dizziness.   meloxicam (MOBIC) 7.5 MG tablet Take 1 tablet (7.5 mg total) by mouth daily.   metFORMIN (GLUCOPHAGE) 1000 MG tablet Take 1 tablet (1,000 mg total) by mouth 2 (two) times daily with a meal.   metoprolol tartrate (LOPRESSOR) 50 MG tablet Take 1 tablet (50 mg total) by mouth 2 (two) times daily.   montelukast (SINGULAIR) 10 MG tablet TAKE 1 TABLET BY MOUTH DAILY   mupirocin ointment (BACTROBAN) 2 % Apply 1 Application topically 3 (three) times daily.   nystatin (MYCOSTATIN) 100000 UNIT/ML suspension 1 teaspoon  swish, gargle and spit four times per day.   Omega-3 Fatty Acids (FISH OIL) 1000 MG CAPS Take by mouth.   omeprazole (PRILOSEC)  20 MG capsule TAKE 1 CAPSULE BY MOUTH DAILY   ONETOUCH DELICA PLUS LANCETS MISC 1 each by Does not apply route 2 (two) times daily.   OZEMPIC, 2 MG/DOSE, 8 MG/3ML SOPN Inject 2 mg into the skin once a week.   phenylephrine (NEO-SYNEPHRINE) 0.5 % nasal solution Place 1 drop into both nostrils every 6 (six) hours as needed for congestion. VICKS NASAL INHALER   traZODone (DESYREL) 50 MG tablet TAKE 1 TABLET BY MOUTH AT  BEDTIME   UNABLE TO FIND Med Name: Naltrexone 3 mg - Bud 180mg    vitamin B-12 (CYANOCOBALAMIN) 1000 MCG tablet Take 1,000 mcg by mouth daily.   [DISCONTINUED] clobetasol (TEMOVATE) 0.05 % external solution Apply topically 2 (two) times daily. To scalp lesions       02/10/2023    2:03 PM 01/06/2023    2:27 PM 09/03/2022    8:42 AM 04/09/2022   11:17 AM  GAD 7 : Generalized Anxiety Score  Nervous, Anxious, on Edge 0 0 0 1  Control/stop worrying 0 0 0 0  Worry too much - different things 0 0 0 1  Trouble relaxing 0 0 0 1  Restless 0 0 0 1  Easily annoyed or irritable  0 0 0 0  Afraid - awful might happen 0 0 0 0  Total GAD 7 Score 0 0 0 4  Anxiety Difficulty Not difficult at all Not difficult at all Not difficult at all Not difficult at all       02/10/2023    2:03 PM 01/06/2023    2:27 PM 09/03/2022    8:41 AM  Depression screen PHQ 2/9  Decreased Interest 0 0 2  Down, Depressed, Hopeless 0 0 2  PHQ - 2 Score 0 0 4  Altered sleeping 1 1 1   Tired, decreased energy 0 0 1  Change in appetite 0 0 0  Feeling bad or failure about yourself  0 0 0  Trouble concentrating 0 0 0  Moving slowly or fidgety/restless 0 0 0  Suicidal thoughts 0 0 0  PHQ-9 Score 1 1 6   Difficult doing work/chores Not difficult at all Not difficult at all Not difficult at all    BP Readings from Last 3 Encounters:  02/10/23 104/76  01/06/23 98/68  09/03/22 122/70    Physical Exam Vitals and nursing note reviewed.  Constitutional:      General: She is not in acute distress.    Appearance: She is well-developed.  HENT:     Head: Normocephalic and atraumatic.     Jaw: There is normal jaw occlusion.  Cardiovascular:     Rate and Rhythm: Normal rate and regular rhythm.  Pulmonary:     Effort: Pulmonary effort is normal. No respiratory distress.     Breath sounds: No wheezing or rhonchi.  Musculoskeletal:     Cervical back: Normal range of motion and neck supple. No pain with movement.  Skin:    General: Skin is warm and dry.     Findings: No rash.     Comments: Scattered excoriated lesions on scalp, hands, legs.  Neurological:     Mental Status: She is alert and oriented to person, place, and time.  Psychiatric:        Mood and Affect: Mood normal.        Behavior: Behavior normal.     Wt Readings from Last 3 Encounters:  02/10/23 136 lb (61.7 kg)  01/06/23 135 lb (61.2 kg)  09/03/22 136 lb  9.6 oz (62 kg)    BP 104/76 (BP Location: Left Arm, Patient Position: Sitting, Cuff Size: Normal)   Pulse (!) 109   Ht 4\' 11"  (1.499 m)   Wt 136 lb (61.7 kg)   SpO2  97%   BMI 27.47 kg/m   Assessment and Plan:  Problem List Items Addressed This Visit       Unprioritized   Type II diabetes mellitus with complication (HCC) (Chronic)   Had proteinuria last July and had discontinued losartan due to low BP. BP still low.  Will add Jardiance 25 mg daily. She has follow up with Endo in January.  No change in other medications unless glucoses drop.      Relevant Medications   empagliflozin (JARDIANCE) 25 MG TABS tablet   Migraine without aura and without status migrainosus, not intractable - Primary   Hx of migraines when younger Now having mild HA with n/v not relieved by Tylenol. Will give sample of Nurtec to take daily as needed.      Psoriasis   Apply clobetasol solution to scalp lesions      Relevant Medications   clobetasol (TEMOVATE) 0.05 % external solution    No follow-ups on file.    Reubin Milan, MD [facility] [place]

## 2023-02-10 NOTE — Telephone Encounter (Signed)
  Chief Complaint: headache Symptoms: moderate headache- comes and goes- but present for last 4 days.  Frequency: daily Pertinent Negatives: Patient denies fever, stiff neck, eye pain, sore throat, cold symptoms  Disposition: [] ED /[] Urgent Care (no appt availability in office) / [x] Appointment(In office/virtual)/ []  [facility] [place] [facility]/ [] Home Care/ [] Refused Recommended Disposition /[] [place] [facility]/ []  Follow-up with PCP Additional Notes: Patient states she has migraine hx but it has been a long time since she had migraine. Appointment scheduled

## 2023-02-28 ENCOUNTER — Ambulatory Visit: Payer: Self-pay

## 2023-02-28 ENCOUNTER — Ambulatory Visit: Payer: Medicare Other | Admitting: Family Medicine

## 2023-02-28 NOTE — Telephone Encounter (Signed)
 Chief Complaint: Low blood sugar Symptoms: sweaty at night, urine frequency, tired the next day Frequency: occurring on 02/20/23 and again last night Pertinent Negatives: Patient denies other symptoms Disposition: [] ED /[] Urgent Care (no appt availability in office) / [x] Appointment(In office/virtual)/ []  [place] [facility]/ [] Home Care/ [] Refused Recommended Disposition /[] [place] [facility]/ []  Follow-up with PCP Additional Notes: Patient reports low blood sugars after waking up sweating at night, 61 on 02/20/23 and 71 last night. She says she drinks ginger ale w/sugar and glucose liquid that she keeps next to her bed, her blood sugars go up in the low 90's. She says the next day she feels tired all day after the low blood sugar episodes. She is wanting to be seen in the office today by Dr. Justus. Advised no availability with her until 03/11/23, she agreed to see Dr. Alvia today.    Reason for Disposition  [1] Blood glucose 70  mg/dL (3.9 mmol/L) or below OR symptomatic, now improved with Care Advice AND [2] cause unknown  Answer Assessment - Initial Assessment Questions 1. SYMPTOMS: What symptoms are you concerned about?     Low blood sugar, sweating at night 2. ONSET:  When did the symptoms start?     Last week 26th 3. BLOOD GLUCOSE: What is your blood glucose level?      61 4. USUAL RANGE: What is your blood glucose level usually? (e.g., usual fasting morning value, usual evening value)     Varies 80-100's 5. TYPE 1 or 2:  Do you know what type of diabetes you have?  (e.g., Type 1, Type 2, Gestational; doesn't know)      Type 2 6. INSULIN : Do you take insulin ? What type of insulin (s) do you use? What is the mode of delivery? (syringe, pen; injection or pump) When did you last give yourself an insulin  dose? (i.e., time or hours/minutes ago) How much did you give? (i.e., how many units)     Yes-lantus  20 units am, ozempic every other week 7. DIABETES PILLS:  Do you take any pills for your diabetes? If Yes, ask: What is the name of the medicine(s) that you take for high blood sugar?     Pills-jardiance , metformin  8. OTHER SYMPTOMS: Do you have any symptoms? (e.g., fever, frequent urination, difficulty breathing, vomiting)     Frequent urination 9. LOW BLOOD GLUCOSE TREATMENT: What have you done so far to treat the low blood glucose level?     Glucose liquid, gingerale with sugar  Protocols used: Diabetes - Low Blood Sugar-A-AH

## 2023-03-05 ENCOUNTER — Ambulatory Visit: Payer: Medicare Other | Admitting: Internal Medicine

## 2023-03-05 ENCOUNTER — Encounter: Payer: Self-pay | Admitting: Internal Medicine

## 2023-03-05 VITALS — BP 128/74 | HR 91 | Ht 59.0 in | Wt 135.0 lb

## 2023-03-05 DIAGNOSIS — E118 Type 2 diabetes mellitus with unspecified complications: Secondary | ICD-10-CM | POA: Diagnosis not present

## 2023-03-05 DIAGNOSIS — Z7984 Long term (current) use of oral hypoglycemic drugs: Secondary | ICD-10-CM | POA: Diagnosis not present

## 2023-03-05 NOTE — Progress Notes (Signed)
 Date:  03/05/2023   Name:  Elizabeth Klein   DOB:  [birth year]   MRN:  [id number]   Chief Complaint: Hypoglycemia (Taking metformin  1000 mg daily instead of twice daily since low blood sugar episodes last week. Patient is feeling better but still fatigued. Last low BS episodes was a few days ago in the morning. Its was 61.)  Diabetes She presents for her follow-up diabetic visit. She has type 2 diabetes mellitus. Her disease course has been improving. Hypoglycemia symptoms include dizziness and sweats. Pertinent negatives for hypoglycemia include no headaches or tremors. Associated symptoms include fatigue and weakness. Pertinent negatives for diabetes include no chest pain, no polydipsia and no polyuria. Hypoglycemia complications include nocturnal hypoglycemia. Pertinent negatives for hypoglycemia complications include no blackouts, no hospitalization and no required assistance. Current diabetic treatments: Jardiance  and MTF.    Review of Systems  Constitutional:  Positive for fatigue. Negative for appetite change, fever and unexpected weight change.  HENT:  Negative for tinnitus and trouble swallowing.   Eyes:  Negative for visual disturbance.  Respiratory:  Negative for cough, chest tightness and shortness of breath.   Cardiovascular:  Negative for chest pain, palpitations and leg swelling.  Gastrointestinal:  Negative for abdominal pain.  Endocrine: Negative for polydipsia and polyuria.  Genitourinary:  Negative for dysuria and hematuria.  Musculoskeletal:  Negative for arthralgias.  Neurological:  Positive for dizziness and weakness. Negative for tremors, numbness and headaches.  Psychiatric/Behavioral:  Negative for dysphoric mood.      Lab Results  Component Value Date   NA 141 09/03/2022   K 5.8 (H) 09/03/2022   CO2 22 09/03/2022   GLUCOSE 165 (H) 09/03/2022   BUN 20 09/03/2022   CREATININE 0.90 09/03/2022   CALCIUM  9.9 09/03/2022   EGFR 69 09/03/2022   GFRNONAA >60  03/06/2022   Lab Results  Component Value Date   CHOL 117 09/03/2022   HDL 47 09/03/2022   LDLCALC 46 09/03/2022   TRIG 139 09/03/2022   CHOLHDL 2.5 09/03/2022   Lab Results  Component Value Date   TSH 1.360 08/30/2021   Lab Results  Component Value Date   HGBA1C 7.4 (A) 01/06/2023   Lab Results  Component Value Date   WBC 8.7 09/03/2022   HGB 11.8 09/03/2022   HCT 37.7 09/03/2022   MCV 89 09/03/2022   PLT 401 09/03/2022   Lab Results  Component Value Date   ALT 17 09/03/2022   AST 17 09/03/2022   ALKPHOS 107 09/03/2022   BILITOT 0.4 09/03/2022   Lab Results  Component Value Date   VD25OH 38.6 08/30/2021     Patient Active Problem List   Diagnosis Date Noted   Migraine without aura and without status migrainosus, not intractable 02/10/2023   Psoriasis 02/10/2023   Impingement syndrome of shoulder, right 07/09/2021   Cervical paraspinal muscle spasm 07/09/2021   Degeneration of lumbar intervertebral disc 04/19/2021   Oropharyngeal dysphagia 02/23/2019   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   CKD (chronic kidney disease) stage 3, GFR 30-59 ml/min (HCC) 04/30/2017   Type II diabetes mellitus with complication (HCC) 03/19/2017   Essential hypertension 03/19/2017   CAD (coronary artery disease) 03/19/2017   History of kidney stones 03/19/2017   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   Hyperlipidemia associated with type 2 diabetes mellitus (HCC) 03/19/2017   Nocturnal leg cramps 03/19/2017   Primary insomnia 03/19/2017   Allergic rhinitis 03/19/2017    Allergies  Allergen Reactions   Prednisone  Other (See Comments)    Intolerance  - confusion     Past Surgical History:  Procedure Laterality Date   BREAST BIOPSY Right    neg   CATARACT EXTRACTION W/PHACO Left 09/27/2019   Procedure: CATARACT EXTRACTION PHACO AND INTRAOCULAR LENS PLACEMENT (IOC) LEFT DIABETIC ISTENT INJ INTRAVITREAL KENALOG  INJ;  Surgeon: Myrna Adine Anes, MD;  Location:  [facility];  Service: Ophthalmology;  Laterality: Left;  2.83 0:28.7   CATARACT EXTRACTION W/PHACO Right 12/13/2019   Procedure: CATARACT EXTRACTION PHACO AND INTRAOCULAR LENS PLACEMENT (IOC) RIGHT ISTENT INJ KENALOG  INJ DIABETIC;  Surgeon: Myrna Adine Anes, MD;  Location: [facility];  Service: Ophthalmology;  Laterality: Right;  1.39 0:21.5   CESAREAN SECTION      Social History   Tobacco Use   Smoking status: Never   Smokeless tobacco: Never  Vaping Use   Vaping status: Never Used  Substance Use Topics   Alcohol use: Never   Drug use: Never     Medication list has been reviewed and updated.  Current Meds  Medication Sig   ACCU-CHEK GUIDE test strip CHECK BLOOD SUGAR 3 TIMES DAILY   acetaminophen  (TYLENOL ) 500 MG tablet Take 1,000 mg by mouth every 8 (eight) hours as needed.   albuterol  (VENTOLIN  HFA) 108 (90 Base) MCG/ACT inhaler Inhale 2 puffs into the lungs every 6 (six) hours as needed for wheezing or shortness of breath.   Ascorbic Acid (VITAMIN C PO) Take by mouth.   aspirin  EC 81 MG tablet Take 1 tablet (81 mg total) by mouth daily.   atorvastatin  (LIPITOR) 80 MG tablet Take 1 tablet (80 mg total) by mouth daily.   blood glucose meter kit and supplies Dispense based on patient and insurance preference. Use up to four times daily as directed. (FOR ICD-10 E10.9, E11.9).   clobetasol  (TEMOVATE ) 0.05 % external solution Apply topically 2 (two) times daily. To scalp lesions   clobetasol  ointment (TEMOVATE ) 0.05 % Apply 1 application topically 2 (two) times daily.   DUPIXENT 300 MG/2ML SOPN Inject into the skin every 14 (fourteen) days.   empagliflozin  (JARDIANCE ) 25 MG TABS tablet Take 1 tablet (25 mg total) by mouth daily before breakfast.   gabapentin  (NEURONTIN ) 100 MG capsule TAKE 1 CAPSULE BY MOUTH 3 TIMES  DAILY   glimepiride  (AMARYL ) 2 MG tablet TAKE 1 TABLET BY MOUTH DAILY  WITH BREAKFAST   hydrocortisone  2.5 % ointment Apply topically 2 (two)  times daily. To lesion on arm and buttocks   insulin  glargine (LANTUS ) 100 UNIT/ML injection Inject 0.4 mLs (40 Units total) into the skin every morning. Pt taking QAM (Patient taking differently: Inject 27 Units into the skin every morning. Pt taking QAM)   Insulin  Syringe-Needle U-100 (BD INSULIN  SYRINGE U/F) 30G X 1/2 0.5 ML MISC USE WITH LANTUS  INJECTIONS FOR  TYPE 2 DIABETES MELLITUS   meclizine  (ANTIVERT ) 12.5 MG tablet Take 1 tablet (12.5 mg total) by mouth 3 (three) times daily as needed for dizziness.   meloxicam  (MOBIC ) 7.5 MG tablet Take 1 tablet (7.5 mg total) by mouth daily.   metFORMIN  (GLUCOPHAGE ) 1000 MG tablet Take 1 tablet (1,000 mg total) by mouth 2 (two) times daily with a meal. (Patient taking differently: Take 1,000 mg by mouth daily with breakfast.)   metoprolol  tartrate (LOPRESSOR ) 50 MG tablet Take 1 tablet (50 mg total) by mouth 2 (two) times daily.   montelukast  (SINGULAIR ) 10 MG tablet TAKE 1 TABLET BY MOUTH DAILY   mupirocin ointment (BACTROBAN) 2 %  Apply 1 Application topically 3 (three) times daily.   nystatin  (MYCOSTATIN ) 100000 UNIT/ML suspension 1 teaspoon  swish, gargle and spit four times per day.   Omega-3 Fatty Acids (FISH OIL) 1000 MG CAPS Take by mouth.   omeprazole  (PRILOSEC) 20 MG capsule TAKE 1 CAPSULE BY MOUTH DAILY   ONETOUCH DELICA PLUS LANCETS MISC 1 each by Does not apply route 2 (two) times daily.   OZEMPIC, 2 MG/DOSE, 8 MG/3ML SOPN Inject 2 mg into the skin once a week.   phenylephrine (NEO-SYNEPHRINE) 0.5 % nasal solution Place 1 drop into both nostrils every 6 (six) hours as needed for congestion. VICKS NASAL INHALER   traZODone  (DESYREL ) 50 MG tablet TAKE 1 TABLET BY MOUTH AT  BEDTIME   UNABLE TO FIND Med Name: Naltrexone 3 mg - Bud 180mg    vitamin B-12 (CYANOCOBALAMIN) 1000 MCG tablet Take 1,000 mcg by mouth daily.       03/05/2023    1:41 PM 02/10/2023    2:03 PM 01/06/2023    2:27 PM 09/03/2022    8:42 AM  GAD 7 : Generalized Anxiety  Score  Nervous, Anxious, on Edge 1 0 0 0  Control/stop worrying 1 0 0 0  Worry too much - different things 1 0 0 0  Trouble relaxing 0 0 0 0  Restless 0 0 0 0  Easily annoyed or irritable 0 0 0 0  Afraid - awful might happen 0 0 0 0  Total GAD 7 Score 3 0 0 0  Anxiety Difficulty Not difficult at all Not difficult at all Not difficult at all Not difficult at all       03/05/2023    1:41 PM 02/10/2023    2:03 PM 01/06/2023    2:27 PM  Depression screen PHQ 2/9  Decreased Interest 1 0 0  Down, Depressed, Hopeless 1 0 0  PHQ - 2 Score 2 0 0  Altered sleeping 1 1 1   Tired, decreased energy 2 0 0  Change in appetite 1 0 0  Feeling bad or failure about yourself  1 0 0  Trouble concentrating 1 0 0  Moving slowly or fidgety/restless 1 0 0  Suicidal thoughts 0 0 0  PHQ-9 Score 9 1 1   Difficult doing work/chores Somewhat difficult Not difficult at all Not difficult at all    BP Readings from Last 3 Encounters:  03/05/23 128/74  02/10/23 104/76  01/06/23 98/68    Physical Exam Vitals and nursing note reviewed.  Constitutional:      General: She is not in acute distress.    Appearance: Normal appearance. She is well-developed.  HENT:     Head: Normocephalic and atraumatic.  Cardiovascular:     Rate and Rhythm: Normal rate and regular rhythm.  Pulmonary:     Effort: Pulmonary effort is normal. No respiratory distress.     Breath sounds: No wheezing or rhonchi.  Musculoskeletal:     Cervical back: Normal range of motion.     Right lower leg: No edema.     Left lower leg: No edema.  Skin:    General: Skin is warm and dry.     Findings: No rash.  Neurological:     General: No focal deficit present.     Mental Status: She is alert and oriented to person, place, and time.  Psychiatric:        Mood and Affect: Mood normal.        Behavior: Behavior normal.  Wt Readings from Last 3 Encounters:  03/05/23 135 lb (61.2 kg)  02/10/23 136 lb (61.7 kg)  01/06/23 135 lb  (61.2 kg)    BP 128/74   Pulse 91   Ht 4' 11 (1.499 m)   Wt 135 lb (61.2 kg)   SpO2 98%   BMI 27.27 kg/m   Assessment and Plan:  Problem List Items Addressed This Visit       Unprioritized   Type II diabetes mellitus with complication (HCC) - Primary (Chronic)   Started Jardiance  last visit then reduced MTF to once a day due to low BS Feeling somewhat improved but still weak and having low BS readings - Will reduce Jardiance  12.5 mg per day - pt will discuss with Dr. Cherilyn and get new Rx for 10 mg if appropriate       No follow-ups on file.    Leita HILARIO Adie, MD [facility] [place]

## 2023-03-05 NOTE — Assessment & Plan Note (Addendum)
 Started Jardiance  last visit then reduced MTF to once a day due to low BS Feeling somewhat improved but still weak and having low BS readings - Will reduce Jardiance  12.5 mg per day - pt will discuss with Dr. Cherilyn and get new Rx for 10 mg if appropriate

## 2023-03-05 NOTE — Patient Instructions (Signed)
 Cut the Jardiance in half and take only 1/2 per day.  Continue one Metformin per day.  Discuss the changes with Dr. Gershon Crane.

## 2023-03-07 DIAGNOSIS — E1169 Type 2 diabetes mellitus with other specified complication: Secondary | ICD-10-CM | POA: Diagnosis not present

## 2023-03-07 DIAGNOSIS — E1159 Type 2 diabetes mellitus with other circulatory complications: Secondary | ICD-10-CM | POA: Diagnosis not present

## 2023-03-07 DIAGNOSIS — Z794 Long term (current) use of insulin: Secondary | ICD-10-CM | POA: Diagnosis not present

## 2023-03-07 DIAGNOSIS — I152 Hypertension secondary to endocrine disorders: Secondary | ICD-10-CM | POA: Diagnosis not present

## 2023-03-07 DIAGNOSIS — E1142 Type 2 diabetes mellitus with diabetic polyneuropathy: Secondary | ICD-10-CM | POA: Diagnosis not present

## 2023-03-07 DIAGNOSIS — E785 Hyperlipidemia, unspecified: Secondary | ICD-10-CM | POA: Diagnosis not present

## 2023-04-10 DIAGNOSIS — E1142 Type 2 diabetes mellitus with diabetic polyneuropathy: Secondary | ICD-10-CM | POA: Diagnosis not present

## 2023-04-16 ENCOUNTER — Ambulatory Visit: Payer: Medicare Other

## 2023-04-18 ENCOUNTER — Other Ambulatory Visit: Payer: Self-pay | Admitting: Internal Medicine

## 2023-04-18 ENCOUNTER — Ambulatory Visit: Payer: Medicare Other

## 2023-04-18 DIAGNOSIS — I10 Essential (primary) hypertension: Secondary | ICD-10-CM

## 2023-04-18 DIAGNOSIS — I2510 Atherosclerotic heart disease of native coronary artery without angina pectoris: Secondary | ICD-10-CM

## 2023-04-18 NOTE — Progress Notes (Signed)
Patient is in office today for a nurse visit for  [place] monitor . Patient was provided with instruction and demonstration on how to Apply CGM sensor and use receiver

## 2023-04-18 NOTE — Telephone Encounter (Signed)
Requested Prescriptions  Pending Prescriptions Disp Refills   metoprolol tartrate (LOPRESSOR) 50 MG tablet [Pharmacy Med Name: Metoprolol Tartrate 50 MG Oral Tablet] 200 tablet 2    Sig: TAKE 1 TABLET BY MOUTH TWICE  DAILY     Cardiovascular:  Beta Blockers Passed - 04/18/2023  3:49 PM      Passed - Last BP in normal range    BP Readings from Last 1 Encounters:  03/05/23 128/74         Passed - Last Heart Rate in normal range    Pulse Readings from Last 1 Encounters:  03/05/23 91         Passed - Valid encounter within last 6 months    Recent Outpatient Visits           1 month ago Type II diabetes mellitus with complication [facility])   [place] [facility] at [facility], Nyoka Cowden, MD   2 months ago Migraine without aura and without status migrainosus, not intractable   [place] [facility] at [facility], Nyoka Cowden, MD   3 months ago Essential hypertension   [place] [facility] at [facility], Nyoka Cowden, MD   7 months ago Annual physical exam   [facility] at [facility], Nyoka Cowden, MD   8 months ago Nasal congestion   [facility] at [facility], Nyoka Cowden, MD       Future Appointments             In 2 weeks Judithann Graves, Nyoka Cowden, MD [facility] Primary [facility] & [facility] Medicine at [facility], [facility]

## 2023-04-20 ENCOUNTER — Other Ambulatory Visit: Payer: Self-pay | Admitting: Internal Medicine

## 2023-04-20 DIAGNOSIS — E118 Type 2 diabetes mellitus with unspecified complications: Secondary | ICD-10-CM

## 2023-04-22 NOTE — Telephone Encounter (Signed)
 Requested Prescriptions  Pending Prescriptions Disp Refills   empagliflozin (JARDIANCE) 25 MG TABS tablet [Pharmacy Med Name: Jardiance 25 MG Oral Tablet] 100 tablet 0    Sig: TAKE 1 TABLET BY MOUTH DAILY  BEFORE BREAKFAST     Endocrinology:  Diabetes - SGLT2 Inhibitors Passed - 04/22/2023  8:54 AM      Passed - Cr in normal range and within 360 days    Creatinine, Ser  Date Value Ref Range Status  09/03/2022 0.90 0.57 - 1.00 mg/dL Final         Passed - HBA1C is between 0 and 7.9 and within 180 days    Hemoglobin A1C  Date Value Ref Range Status  01/06/2023 7.4 (A) 4.0 - 5.6 % Final  09/20/2022 7.5  Final         Passed - eGFR in normal range and within 360 days    GFR calc Af Amer  Date Value Ref Range Status  08/23/2019 >60 >60 mL/min Final   GFR, Estimated  Date Value Ref Range Status  03/06/2022 >60 >60 mL/min Final    Comment:    (NOTE) Calculated using the CKD-EPI Creatinine Equation (2021)    eGFR  Date Value Ref Range Status  09/03/2022 69 >59 mL/min/1.73 Final         Passed - Valid encounter within last 6 months    Recent Outpatient Visits           1 month ago Type II diabetes mellitus with complication (HCC)   [place] [facility] at [facility], Nyoka Cowden, MD   2 months ago Migraine without aura and without status migrainosus, not intractable   [place] [facility] at [facility], Nyoka Cowden, MD   3 months ago Essential hypertension   [place] [facility] at [facility], Nyoka Cowden, MD   7 months ago Annual physical exam   [facility] at [facility], Nyoka Cowden, MD   8 months ago Nasal congestion   [place] [facility] at [facility], Nyoka Cowden, MD       Future Appointments             In 2 weeks Judithann Graves Nyoka Cowden, MD [facility] at [facility]             metFORMIN (GLUCOPHAGE) 1000 MG tablet [Pharmacy Med Name: metFORMIN HCl 1000 MG Oral Tablet] 180 tablet 0    Sig: Take 1 tablet (1,000 mg total) by mouth daily with breakfast.     Endocrinology:  Diabetes - Biguanides Passed - 04/22/2023  8:54 AM      Passed - Cr in normal range and within 360 days    Creatinine, Ser  Date Value Ref Range Status  09/03/2022 0.90 0.57 - 1.00 mg/dL Final         Passed - HBA1C is between 0 and 7.9 and within 180 days    Hemoglobin A1C  Date Value Ref Range Status  01/06/2023 7.4 (A) 4.0 - 5.6 % Final  09/20/2022 7.5  Final         Passed - eGFR in normal range and within 360 days    GFR calc Af Amer  Date Value Ref Range Status  08/23/2019 >60 >60 mL/min Final   GFR, Estimated  Date Value Ref Range Status  03/06/2022 >60 >60 mL/min Final    Comment:    (NOTE) Calculated using the CKD-EPI Creatinine Equation (2021)    eGFR  Date Value Ref Range Status  09/03/2022 69 >59 mL/min/1.73 Final         Passed - B12 Level in normal range and within 720 days    Vitamin B-12  Date Value Ref Range Status  11/16/2021 378  Final         Passed - Valid encounter within last 6 months    Recent Outpatient Visits           1 month ago Type II diabetes mellitus with complication [facility])   [place] [facility] at [facility], Nyoka Cowden, MD   2 months ago Migraine without aura and without status migrainosus, not intractable   [place] [facility] at [facility], Nyoka Cowden, MD   3 months ago Essential hypertension   [place] [facility] at [facility], Nyoka Cowden, MD   7 months ago Annual physical exam   [facility] & Sports Medicine at [facility], Nyoka Cowden, MD   8 months ago Nasal congestion    Primary Care & Sports Medicine at [facility], Nyoka Cowden, MD       Future  Appointments             In 2 weeks Reubin Milan, MD [facility] Primary Care & Sports Medicine at [facility], [facility]            Passed - CBC within normal limits and completed in the last 12 months    WBC  Date Value Ref Range Status  09/03/2022 8.7 3.4 - 10.8 x10E3/uL Final  04/20/2021 14.1 (H) 4.0 - 10.5 K/uL Final   RBC  Date Value Ref Range Status  09/03/2022 4.22 3.77 - 5.28 x10E6/uL Final  04/20/2021 4.22 3.87 - 5.11 MIL/uL Final   Hemoglobin  Date Value Ref Range Status  09/03/2022 11.8 11.1 - 15.9 g/dL Final   Hematocrit  Date Value Ref Range Status  09/03/2022 37.7 34.0 - 46.6 % Final   MCHC  Date Value Ref Range Status  09/03/2022 31.3 (L) 31.5 - 35.7 g/dL Final  16/11/9602 54.0 30.0 - 36.0 g/dL Final   [facility]  Date Value Ref Range Status  09/03/2022 28.0 26.6 - 33.0 pg Final  04/20/2021 28.2 26.0 - 34.0 pg Final   MCV  Date Value Ref Range Status  09/03/2022 89 79 - 97 fL Final   No results found for: "PLTCOUNTKUC", "LABPLAT", "POCPLA" RDW  Date Value Ref Range Status  09/03/2022 13.4 11.7 - 15.4 % Final          glucose blood (ACCU-CHEK GUIDE TEST) test strip [Pharmacy Med Name: Accu-Chek Guide In Vitro Strip] 300 strip 0    Sig: USE TO CHECK BLOOD SUGAR 3 TIMES DAILY     There is no refill protocol information for this order

## 2023-05-01 ENCOUNTER — Ambulatory Visit: Payer: Medicare Other | Admitting: Emergency Medicine

## 2023-05-01 VITALS — Ht 59.0 in | Wt 130.0 lb

## 2023-05-01 DIAGNOSIS — Z780 Asymptomatic menopausal state: Secondary | ICD-10-CM | POA: Diagnosis not present

## 2023-05-01 DIAGNOSIS — Z1231 Encounter for screening mammogram for malignant neoplasm of breast: Secondary | ICD-10-CM

## 2023-05-01 DIAGNOSIS — M8580 Other specified disorders of bone density and structure, unspecified site: Secondary | ICD-10-CM | POA: Diagnosis not present

## 2023-05-01 DIAGNOSIS — Z0000 Encounter for general adult medical examination without abnormal findings: Secondary | ICD-10-CM | POA: Diagnosis not present

## 2023-05-01 DIAGNOSIS — E118 Type 2 diabetes mellitus with unspecified complications: Secondary | ICD-10-CM

## 2023-05-01 NOTE — Progress Notes (Signed)
 Subjective:   Elizabeth Klein is a 73 y.o. who presents for a Medicare Wellness preventive visit.  Visit Complete: Virtual I connected with  [name] on 05/01/23 by a audio enabled telemedicine application and verified that I am speaking with the correct person using two identifiers.  Patient Location: Home  Provider Location: Home Office  I discussed the limitations of evaluation and management by telemedicine. The patient expressed understanding and agreed to proceed.  Vital Signs: Because this visit was a virtual/telehealth visit, some criteria may be missing or patient reported. Any vitals not documented were not able to be obtained and vitals that have been documented are patient reported.  VideoDeclined- This patient declined Librarian, academic. Therefore the visit was completed with audio only.  AWV Questionnaire: No: Patient Medicare AWV questionnaire was not completed prior to this visit.  Cardiac Risk Factors include: advanced age (>76men, >52 women);hypertension;dyslipidemia;diabetes mellitus;Other (see comment), Risk factor comments: CAD     Objective:    Today's Vitals   05/01/23 0957  Weight: 130 lb (59 kg)  Height: 4\' 11"  (1.499 m)   Body mass index is 26.26 kg/m.     05/01/2023   10:24 AM 04/20/2021    4:53 PM 04/20/2021    3:37 PM 10/16/2020   10:14 AM 12/13/2019    8:28 AM 10/13/2019   10:24 AM 09/27/2019    6:45 AM  Advanced Directives  Does Patient Have a Medical Advance Directive? No No No No No No No  Would patient like information on creating a medical advance directive? Yes (MAU/Ambulatory/Procedural Areas - Information given) No - Patient declined  Yes (MAU/Ambulatory/Procedural Areas - Information given) No - Patient declined Yes (MAU/Ambulatory/Procedural Areas - Information given) No - Patient declined    Current Medications (verified) Outpatient Encounter Medications as of 05/01/2023  Medication Sig    acetaminophen (TYLENOL) 500 MG tablet Take 1,000 mg by mouth every 8 (eight) hours as needed.   Ascorbic Acid (VITAMIN C PO) Take by mouth.   aspirin EC 81 MG tablet Take 1 tablet (81 mg total) by mouth daily.   atorvastatin (LIPITOR) 80 MG tablet Take 1 tablet (80 mg total) by mouth daily.   blood glucose meter kit and supplies Dispense based on patient and insurance preference. Use up to four times daily as directed. (FOR ICD-10 E10.9, E11.9).   clobetasol (TEMOVATE) 0.05 % external solution Apply topically 2 (two) times daily. To scalp lesions   clobetasol ointment (TEMOVATE) 0.05 % Apply 1 application topically 2 (two) times daily.   empagliflozin (JARDIANCE) 25 MG TABS tablet TAKE 1 TABLET BY MOUTH DAILY  BEFORE BREAKFAST   gabapentin (NEURONTIN) 100 MG capsule TAKE 1 CAPSULE BY MOUTH 3 TIMES  DAILY   glimepiride (AMARYL) 2 MG tablet TAKE 1 TABLET BY MOUTH DAILY  WITH BREAKFAST   glucose blood (ACCU-CHEK GUIDE TEST) test strip USE TO CHECK BLOOD SUGAR 3 TIMES DAILY   hydrocortisone 2.5 % ointment Apply topically 2 (two) times daily. To lesion on arm and buttocks   insulin glargine (LANTUS) 100 UNIT/ML injection Inject 0.4 mLs (40 Units total) into the skin every morning. Pt taking QAM (Patient taking differently: Inject 27 Units into the skin every morning. Pt taking QAM)   Insulin Syringe-Needle U-100 (BD INSULIN SYRINGE U/F) 30G X 1/2" 0.5 ML MISC USE WITH LANTUS INJECTIONS FOR  TYPE 2 DIABETES MELLITUS   meloxicam (MOBIC) 7.5 MG tablet Take 1 tablet (7.5 mg total) by mouth daily.   metFORMIN (  GLUCOPHAGE) 1000 MG tablet Take 1 tablet (1,000 mg total) by mouth daily with breakfast.   metoprolol tartrate (LOPRESSOR) 50 MG tablet TAKE 1 TABLET BY MOUTH TWICE  DAILY   montelukast (SINGULAIR) 10 MG tablet TAKE 1 TABLET BY MOUTH DAILY   Omega-3 Fatty Acids (FISH OIL) 1000 MG CAPS Take by mouth.   omeprazole (PRILOSEC) 20 MG capsule TAKE 1 CAPSULE BY MOUTH DAILY   ondansetron (ZOFRAN) 4 MG tablet  Take 4 mg by mouth every 8 (eight) hours as needed.   ONETOUCH DELICA PLUS LANCETS MISC 1 each by Does not apply route 2 (two) times daily.   OZEMPIC, 2 MG/DOSE, 8 MG/3ML SOPN Inject 2 mg into the skin once a week.   traZODone (DESYREL) 50 MG tablet TAKE 1 TABLET BY MOUTH AT  BEDTIME   vitamin B-12 (CYANOCOBALAMIN) 1000 MCG tablet Take 1,000 mcg by mouth daily.   albuterol (VENTOLIN HFA) 108 (90 Base) MCG/ACT inhaler Inhale 2 puffs into the lungs every 6 (six) hours as needed for wheezing or shortness of breath. (Patient not taking: Reported on 05/01/2023)   DUPIXENT 300 MG/2ML SOPN Inject into the skin every 14 (fourteen) days. (Patient not taking: Reported on 05/01/2023)   meclizine (ANTIVERT) 12.5 MG tablet Take 1 tablet (12.5 mg total) by mouth 3 (three) times daily as needed for dizziness. (Patient not taking: Reported on 05/01/2023)   mupirocin ointment (BACTROBAN) 2 % Apply 1 Application topically 3 (three) times daily. (Patient not taking: Reported on 05/01/2023)   nystatin (MYCOSTATIN) 100000 UNIT/ML suspension 1 teaspoon  swish, gargle and spit four times per day. (Patient not taking: Reported on 05/01/2023)   phenylephrine (NEO-SYNEPHRINE) 0.5 % nasal solution Place 1 drop into both nostrils every 6 (six) hours as needed for congestion. VICKS NASAL INHALER (Patient not taking: Reported on 05/01/2023)   UNABLE TO FIND Med Name: Naltrexone 3 mg - Bud 180mg  (Patient not taking: Reported on 05/01/2023)   No facility-administered encounter medications on file as of 05/01/2023.    Allergies (verified) Prednisone   History: Past Medical History:  Diagnosis Date   Acid reflux    Chest pain    Diabetes mellitus without complication (HCC)    Hepatitis    age 31   High cholesterol    Hypertension    Kidney stones    Migraines    Psoriasis    Rapid heart rate    Shingles    spring 2021   Vertigo    1x - approx 12 yrs ago   Weight loss    Past Surgical History:  Procedure Laterality Date    BREAST BIOPSY Right    neg   CATARACT EXTRACTION W/PHACO Left 09/27/2019   Procedure: CATARACT EXTRACTION PHACO AND INTRAOCULAR LENS PLACEMENT (IOC) LEFT DIABETIC ISTENT INJ INTRAVITREAL KENALOG INJ;  Surgeon: Nevada Crane, MD;  Location: [facility];  Service: Ophthalmology;  Laterality: Left;  2.83 0:28.7   CATARACT EXTRACTION W/PHACO Right 12/13/2019   Procedure: CATARACT EXTRACTION PHACO AND INTRAOCULAR LENS PLACEMENT (IOC) RIGHT ISTENT INJ KENALOG INJ DIABETIC;  Surgeon: Nevada Crane, MD;  Location: [facility];  Service: Ophthalmology;  Laterality: Right;  1.39 0:21.5   CESAREAN SECTION     Family History  Problem Relation Age of Onset   Breast cancer Neg Hx    Diabetes Brother    Heart attack Brother    Diabetes Brother    Heart attack Brother    Diabetes Brother    Social History  Socioeconomic History   Marital status: Married    Spouse name: Manoj   Number of children: 1   Years of education: Not on file   Highest education level: Not on file  Occupational History   Not on file  Tobacco Use   Smoking status: Never   Smokeless tobacco: Never  Vaping Use   Vaping status: Never Used  Substance and Sexual Activity   Alcohol use: Never   Drug use: Never   Sexual activity: Not on file  Other Topics Concern   Not on file  Social History Narrative   ** Merged History Encounter **            Social Drivers of Health   Financial Resource Strain: Low Risk  (05/01/2023)   Overall Financial Resource Strain (CARDIA)    Difficulty of Paying Living Expenses: Not hard at all  Food Insecurity: No Food Insecurity (05/01/2023)   Hunger Vital Sign    Worried About Running Out of Food in the Last Year: Never true    Ran Out of Food in the Last Year: Never true  Transportation Needs: No Transportation Needs (05/01/2023)   PRAPARE - Administrator, Civil Service (Medical): No    Lack of Transportation (Non-Medical): No  Physical Activity:  Inactive (05/01/2023)   Exercise Vital Sign    Days of Exercise per Week: 0 days    Minutes of Exercise per Session: 0 min  Stress: No Stress Concern Present (05/01/2023)   [employer] of Occupational Health - Occupational Stress Questionnaire    Feeling of Stress : Only a little  Social Connections: Moderately Integrated (05/01/2023)   Social Connection and Isolation Panel [NHANES]    Frequency of Communication with Friends and Family: More than three times a week    Frequency of Social Gatherings with Friends and Family: Twice a week    Attends Religious Services: More than 4 times per year    Active Member of [employer] or Organizations: No    Attends Engineer, structural: Never    Marital Status: Married    Tobacco Counseling Counseling given: Not Answered    Clinical Intake:  Pre-visit preparation completed: Yes  Pain : No/denies pain     BMI - recorded: 26.26 Nutritional Status: BMI 25 -29 Overweight Nutritional Risks: Nausea/ vomitting/ diarrhea (nausea after meals) Diabetes: Yes CBG done?: No (FBS 119 per patient) Did pt. bring in CBG monitor from home?: No  How often do you need to have someone help you when you read instructions, pamphlets, or other written materials from your doctor or pharmacy?: 1 - Never  Interpreter Needed?: No  Information entered by :: Tora Kindred, CMA   Activities of Daily Living     05/01/2023   10:04 AM 05/08/2022    1:48 PM  In your present state of health, do you have any difficulty performing the following activities:  Hearing? 0 1  Vision? 0 0  Difficulty concentrating or making decisions? 1 0  Comment making decisions   Walking or climbing stairs? 0 0  Dressing or bathing? 0 0  Doing errands, shopping? 1 0  Comment son or husbands takes to appointments   Preparing Food and eating ? N Y  Using the Toilet? N Y  In the past six months, have you accidently leaked urine? Malvin Johns  Comment wears a pad   Do you have problems  with loss of bowel control? Y Y  Comment after taking prune juice  for constipation   Managing your Medications? N Y  Managing your Finances? N Y  Housekeeping or managing your Housekeeping? N Y    Patient Care Team: Reubin Milan, MD as PCP - General (Internal Medicine) Sherlon Handing, MD as Consulting Physician (Endocrinology) Hulan Fray as Physician Assistant (Urology) Nevada Crane, MD as Consulting Physician (Ophthalmology) Dossie Arbour, MD as Referring Physician (Dermatology) Alwyn Pea, MD as Consulting Physician (Cardiology) Reubin Milan, MD (Internal Medicine)  Indicate any recent Medical Services you may have received from other than [facility] providers in the past year (date may be approximate).     Assessment:   This is a routine wellness examination for [place].  Hearing/Vision screen Hearing Screening - Comments:: No hearing loss Vision Screening - Comments:: Gets eye exams, Dr. Willey Blade [facility]   Goals Addressed             This Visit's Progress    Patient Stated       Eat healthy and maintain blood sugar levels       Depression Screen     05/01/2023   10:20 AM 03/05/2023    1:41 PM 02/10/2023    2:03 PM 01/06/2023    2:27 PM 09/03/2022    8:41 AM 05/08/2022    1:48 PM 04/09/2022   11:17 AM  PHQ 2/9 Scores  PHQ - 2 Score 0 2 0 0 4 0 0  PHQ- 9 Score 0 9 1 1 6 5 4     Fall Risk     05/01/2023   10:25 AM 03/05/2023    1:41 PM 02/10/2023    2:03 PM 01/06/2023    2:27 PM 09/03/2022    8:41 AM  Fall Risk   Falls in the past year? 0 0 0 0 0  Number falls in past yr: 0 0 0 0 0  Injury with Fall? 0 0 0 0 0  Risk for fall due to : No Fall Risks No Fall Risks No Fall Risks No Fall Risks No Fall Risks  Follow up Falls prevention discussed;Falls evaluation completed Falls evaluation completed Falls evaluation completed Falls evaluation completed Falls evaluation completed    MEDICARE RISK AT HOME:  Medicare Risk at  Home Any stairs in or around the home?: Yes If so, are there any without handrails?: No Home free of loose throw rugs in walkways, pet beds, electrical cords, etc?: Yes Adequate lighting in your home to reduce risk of falls?: Yes Life alert?: No Use of a cane, walker or w/c?: No Grab bars in the bathroom?: Yes Shower chair or bench in shower?: Yes Elevated toilet seat or a handicapped toilet?: No  TIMED UP AND GO:  Was the test performed?  No  Cognitive Function: 6CIT completed        05/01/2023   10:26 AM 05/08/2022    1:50 PM 10/13/2019   10:33 AM 10/07/2018   10:21 AM  6CIT Screen  What Year? 0 points 0 points 0 points 0 points  What month? 0 points 0 points 0 points 0 points  What time? 0 points 0 points 0 points 0 points  Count back from 20 0 points 0 points 0 points 0 points  Months in reverse 2 points 0 points 0 points 0 points  Repeat phrase 4 points 2 points 0 points 0 points  Total Score 6 points 2 points 0 points 0 points    Immunizations Immunization History  Administered Date(s) Administered  Fluad Trivalent(High Dose 65+) 11/23/2022   Influenza, High Dose Seasonal PF 01/07/2018, 12/02/2018, 11/21/2021   Influenza-Unspecified 10/26/2016, 11/25/2020   Moderna Covid-19 Fall Seasonal Vaccine 40yrs & older 11/23/2022   PFIZER Comirnaty(Gray Top)Covid-19 Tri-Sucrose Vaccine 11/29/2021   PFIZER(Purple Top)SARS-COV-2 Vaccination 04/21/2019, 05/12/2019, 11/23/2019, 06/16/2020, 12/20/2020   PNEUMOCOCCAL CONJUGATE-20 09/03/2022   Pneumococcal Conjugate-13 03/19/2017   Pneumococcal Polysaccharide-23 08/20/2018   Zoster Recombinant(Shingrix) 04/30/2017, 02/23/2020    Screening Tests Health Maintenance  Topic Date Due   COVID-19 Vaccine (8 - 2024-25 season) 01/18/2023   DTaP/Tdap/Td (1 - Tdap) 01/06/2024 (Originally 03/14/1969)   HEMOGLOBIN A1C  07/06/2023   Diabetic kidney evaluation - eGFR measurement  09/03/2023   Diabetic kidney evaluation - Urine ACR   09/03/2023   FOOT EXAM  09/03/2023   DEXA SCAN  11/01/2023   MAMMOGRAM  11/04/2023   OPHTHALMOLOGY EXAM  12/04/2023   Medicare Annual Wellness (AWV)  04/30/2024   Colonoscopy  09/25/2025   Pneumonia Vaccine 29+ Years old  Completed   INFLUENZA VACCINE  Completed   Hepatitis C Screening  Completed   Zoster Vaccines- Shingrix  Completed   HPV VACCINES  Aged Out    Health Maintenance  Health Maintenance Due  Topic Date Due   COVID-19 Vaccine (8 - 2024-25 season) 01/18/2023   Health Maintenance Items Addressed: Mammogram ordered, DEXA ordered, See Nurse Notes  Additional Screening:  Vision Screening: Recommended annual ophthalmology exams for early detection of glaucoma and other disorders of the eye.  Dental Screening: Recommended annual dental exams for proper oral hygiene  Community Resource Referral / Chronic Care Management: CRR required this visit?  No   CCM required this visit?  No     Plan:     I have personally reviewed and noted the following in the patient's chart:   Medical and social history Use of alcohol, tobacco or illicit drugs  Current medications and supplements including opioid prescriptions. Patient is not currently taking opioid prescriptions. Functional ability and status Nutritional status Physical activity Advanced directives List of other physicians Hospitalizations, surgeries, and ER visits in previous 12 months Vitals Screenings to include cognitive, depression, and falls Referrals and appointments  In addition, I have reviewed and discussed with patient certain preventive protocols, quality metrics, and best practice recommendations. A written personalized care plan for preventive services as well as general preventive health recommendations were provided to patient.     Tora Kindred, CMA   05/01/2023   After Visit Summary: (MyChart) Due to this being a telephonic visit, the after visit summary with patients personalized plan was  offered to patient via MyChart   Notes:  6 CIT Score - 6 Needs Tdap vaccine Placed referral to DM & Nutrition education (per patient's request) Placed order for DEXA scan (due 11/01/23) Placed order for MMG (due 11/04/23)

## 2023-05-01 NOTE — Patient Instructions (Addendum)
 Elizabeth Klein , Thank you for taking time to come for your Medicare Wellness Visit. I appreciate your ongoing commitment to your health goals. Please review the following plan we discussed and let me know if I can assist you in the future.   Referrals/Orders/Follow-Ups/Clinician Recommendations:  I have placed a referral to Diabetes and Nutrition Education at [facility]. Someone from that office will call you to schedule appointment. I have placed and order for a bone density test (due 11/01/23) and mammogram (due 11/04/23). Call [facility] @ [phone] to schedule. You may be able to have both tests done in the same visit.  This is a list of the screening recommended for you and due dates:  Health Maintenance  Topic Date Due   COVID-19 Vaccine (8 - 2024-25 season) 01/18/2023   DTaP/Tdap/Td vaccine (1 - Tdap) 01/06/2024*   Hemoglobin A1C  07/06/2023   Yearly kidney function blood test for diabetes  09/03/2023   Yearly kidney health urinalysis for diabetes  09/03/2023   Complete foot exam   09/03/2023   DEXA scan (bone density measurement)  11/01/2023   Mammogram  11/04/2023   Eye exam for diabetics  12/04/2023   Medicare Annual Wellness Visit  04/30/2024   Colon Cancer Screening  09/25/2025   Pneumonia Vaccine  Completed   Flu Shot  Completed   Hepatitis C Screening  Completed   Zoster (Shingles) Vaccine  Completed   HPV Vaccine  Aged Out  *Topic was postponed. The date shown is not the original due date.    Advanced directives: (ACP Link)Information on Advanced Care Planning can be found at [facility] Advance Health Care Directives Advance Health Care Directives ([email or web address]  You may also get these forms at your doctor's office.  Once you have completed the forms, please bring a copy of your health care power of attorney and living will to the office to be added to your chart at your convenience.   Next Medicare Annual Wellness Visit scheduled for next  year: Yes, 05/13/24 @ 11:20am (phone visit)

## 2023-05-06 ENCOUNTER — Encounter: Payer: Self-pay | Admitting: Internal Medicine

## 2023-05-06 ENCOUNTER — Ambulatory Visit (INDEPENDENT_AMBULATORY_CARE_PROVIDER_SITE_OTHER): Payer: Medicare Other | Admitting: Internal Medicine

## 2023-05-06 VITALS — BP 124/68 | HR 88 | Ht 59.0 in | Wt 134.2 lb

## 2023-05-06 DIAGNOSIS — E1169 Type 2 diabetes mellitus with other specified complication: Secondary | ICD-10-CM

## 2023-05-06 DIAGNOSIS — E118 Type 2 diabetes mellitus with unspecified complications: Secondary | ICD-10-CM

## 2023-05-06 DIAGNOSIS — E785 Hyperlipidemia, unspecified: Secondary | ICD-10-CM

## 2023-05-06 DIAGNOSIS — Z794 Long term (current) use of insulin: Secondary | ICD-10-CM

## 2023-05-06 DIAGNOSIS — I10 Essential (primary) hypertension: Secondary | ICD-10-CM | POA: Diagnosis not present

## 2023-05-06 DIAGNOSIS — N1831 Chronic kidney disease, stage 3a: Secondary | ICD-10-CM | POA: Diagnosis not present

## 2023-05-06 LAB — POCT GLYCOSYLATED HEMOGLOBIN (HGB A1C): Hemoglobin A1C: 7 % — AB (ref 4.0–5.6)

## 2023-05-06 NOTE — Assessment & Plan Note (Signed)
 Monitoring Last GFR 69

## 2023-05-06 NOTE — Assessment & Plan Note (Addendum)
 Blood sugars stable without hypoglycemic symptoms or events. Currently managed with MTF, Jardiance, Ozempic and insulin. Changes made last visit are none.  She has noticed that her BS are running higher than usual, probably due to marital stresses. Lab Results  Component Value Date   HGBA1C 7.0 (A) 05/06/2023  Recommend increasing insulin dose to 25 units AM

## 2023-05-06 NOTE — Progress Notes (Signed)
 Date:  05/06/2023   Name:  Helaine Yackel   DOB:  [birth year]   MRN:  [id number]   Chief Complaint: Hypertension and Diabetes  Hypertension This is a chronic problem. The problem is controlled. Pertinent negatives include no chest pain, headaches or shortness of breath. Past treatments include beta blockers. The current treatment provides significant improvement.  Diabetes She presents for her follow-up diabetic visit. She has type 2 diabetes mellitus. Her disease course has been stable. Pertinent negatives for hypoglycemia include no dizziness, headaches or nervousness/anxiousness. Pertinent negatives for diabetes include no chest pain and no fatigue. Current diabetic treatments: MTF, Jardiance, Amaryl, Insulin.  Hyperlipidemia This is a chronic problem. The problem is controlled. Pertinent negatives include no chest pain or shortness of breath. Current antihyperlipidemic treatment includes statins. The current treatment provides significant improvement of lipids.    Review of Systems  Constitutional:  Negative for chills, fatigue and fever.  Eyes:  Negative for visual disturbance.  Respiratory:  Negative for chest tightness and shortness of breath.   Cardiovascular:  Negative for chest pain.  Gastrointestinal:  Negative for abdominal pain.  Musculoskeletal:  Negative for arthralgias and gait problem.  Neurological:  Negative for dizziness and headaches.  Psychiatric/Behavioral:  Negative for dysphoric mood and suicidal ideas. The patient is not nervous/anxious.      Lab Results  Component Value Date   NA 141 09/03/2022   K 5.8 (H) 09/03/2022   CO2 22 09/03/2022   GLUCOSE 165 (H) 09/03/2022   BUN 20 09/03/2022   CREATININE 0.90 09/03/2022   CALCIUM 9.9 09/03/2022   EGFR 69 09/03/2022   GFRNONAA >60 03/06/2022   Lab Results  Component Value Date   CHOL 117 09/03/2022   HDL 47 09/03/2022   LDLCALC 46 09/03/2022   TRIG 139 09/03/2022   CHOLHDL 2.5 09/03/2022   Lab  Results  Component Value Date   TSH 1.360 08/30/2021   Lab Results  Component Value Date   HGBA1C 7.0 (A) 05/06/2023   Lab Results  Component Value Date   WBC 8.7 09/03/2022   HGB 11.8 09/03/2022   HCT 37.7 09/03/2022   MCV 89 09/03/2022   PLT 401 09/03/2022   Lab Results  Component Value Date   ALT 17 09/03/2022   AST 17 09/03/2022   ALKPHOS 107 09/03/2022   BILITOT 0.4 09/03/2022   Lab Results  Component Value Date   VD25OH 38.6 08/30/2021     Patient Active Problem List   Diagnosis Date Noted   Migraine without aura and without status migrainosus, not intractable 02/10/2023   Psoriasis 02/10/2023   Impingement syndrome of shoulder, right 07/09/2021   Cervical paraspinal muscle spasm 07/09/2021   Degeneration of lumbar intervertebral disc 04/19/2021   Oropharyngeal dysphagia 02/23/2019   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   CKD (chronic kidney disease) stage 3, GFR 30-59 ml/min (HCC) 04/30/2017   Type II diabetes mellitus with complication (HCC) 03/19/2017   Essential hypertension 03/19/2017   CAD (coronary artery disease) 03/19/2017   History of kidney stones 03/19/2017   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   Hyperlipidemia associated with type 2 diabetes mellitus (HCC) 03/19/2017   Nocturnal leg cramps 03/19/2017   Primary insomnia 03/19/2017   Allergic rhinitis 03/19/2017    Allergies  Allergen Reactions   Prednisone Other (See Comments)    Intolerance  - confusion     Past Surgical History:  Procedure Laterality Date   BREAST BIOPSY Right    neg  CATARACT EXTRACTION W/PHACO Left 09/27/2019   Procedure: CATARACT EXTRACTION PHACO AND INTRAOCULAR LENS PLACEMENT (IOC) LEFT DIABETIC ISTENT INJ INTRAVITREAL KENALOG INJ;  Surgeon: Nevada Crane, MD;  Location: [facility];  Service: Ophthalmology;  Laterality: Left;  2.83 0:28.7   CATARACT EXTRACTION W/PHACO Right 12/13/2019   Procedure: CATARACT EXTRACTION PHACO AND  INTRAOCULAR LENS PLACEMENT (IOC) RIGHT ISTENT INJ KENALOG INJ DIABETIC;  Surgeon: Nevada Crane, MD;  Location: [facility];  Service: Ophthalmology;  Laterality: Right;  1.39 0:21.5   CESAREAN SECTION      Social History   Tobacco Use   Smoking status: Never   Smokeless tobacco: Never  Vaping Use   Vaping status: Never Used  Substance Use Topics   Alcohol use: Never   Drug use: Never     Medication list has been reviewed and updated.  Current Meds  Medication Sig   acetaminophen (TYLENOL) 500 MG tablet Take 1,000 mg by mouth every 8 (eight) hours as needed.   Ascorbic Acid (VITAMIN C PO) Take by mouth.   aspirin EC 81 MG tablet Take 1 tablet (81 mg total) by mouth daily.   atorvastatin (LIPITOR) 80 MG tablet Take 1 tablet (80 mg total) by mouth daily.   blood glucose meter kit and supplies Dispense based on patient and insurance preference. Use up to four times daily as directed. (FOR ICD-10 E10.9, E11.9).   clobetasol (TEMOVATE) 0.05 % external solution Apply topically 2 (two) times daily. To scalp lesions   clobetasol ointment (TEMOVATE) 0.05 % Apply 1 application topically 2 (two) times daily.   empagliflozin (JARDIANCE) 25 MG TABS tablet TAKE 1 TABLET BY MOUTH DAILY  BEFORE BREAKFAST   gabapentin (NEURONTIN) 100 MG capsule TAKE 1 CAPSULE BY MOUTH 3 TIMES  DAILY   glimepiride (AMARYL) 2 MG tablet TAKE 1 TABLET BY MOUTH DAILY  WITH BREAKFAST   glucose blood (ACCU-CHEK GUIDE TEST) test strip USE TO CHECK BLOOD SUGAR 3 TIMES DAILY   hydrocortisone 2.5 % ointment Apply topically 2 (two) times daily. To lesion on arm and buttocks   insulin glargine (LANTUS) 100 UNIT/ML injection Inject 0.4 mLs (40 Units total) into the skin every morning. Pt taking QAM (Patient taking differently: Inject 25 Units into the skin every morning. Pt taking QAM)   Insulin Syringe-Needle U-100 (BD INSULIN SYRINGE U/F) 30G X 1/2" 0.5 ML MISC USE WITH LANTUS INJECTIONS FOR  TYPE 2 DIABETES  MELLITUS   meloxicam (MOBIC) 7.5 MG tablet Take 1 tablet (7.5 mg total) by mouth daily.   metFORMIN (GLUCOPHAGE) 1000 MG tablet Take 1 tablet (1,000 mg total) by mouth daily with breakfast.   metoprolol tartrate (LOPRESSOR) 50 MG tablet TAKE 1 TABLET BY MOUTH TWICE  DAILY   montelukast (SINGULAIR) 10 MG tablet TAKE 1 TABLET BY MOUTH DAILY   Omega-3 Fatty Acids (FISH OIL) 1000 MG CAPS Take by mouth.   omeprazole (PRILOSEC) 20 MG capsule TAKE 1 CAPSULE BY MOUTH DAILY   ondansetron (ZOFRAN) 4 MG tablet Take 4 mg by mouth every 8 (eight) hours as needed.   ONETOUCH DELICA PLUS LANCETS MISC 1 each by Does not apply route 2 (two) times daily.   OZEMPIC, 2 MG/DOSE, 8 MG/3ML SOPN Inject 2 mg into the skin once a week.   traZODone (DESYREL) 50 MG tablet TAKE 1 TABLET BY MOUTH AT  BEDTIME   vitamin B-12 (CYANOCOBALAMIN) 1000 MCG tablet Take 1,000 mcg by mouth daily.       05/06/2023    2:27  PM 03/05/2023    1:41 PM 02/10/2023    2:03 PM 01/06/2023    2:27 PM  GAD 7 : Generalized Anxiety Score  Nervous, Anxious, on Edge 1 1 0 0  Control/stop worrying 1 1 0 0  Worry too much - different things 1 1 0 0  Trouble relaxing 1 0 0 0  Restless 1 0 0 0  Easily annoyed or irritable 1 0 0 0  Afraid - awful might happen 1 0 0 0  Total GAD 7 Score 7 3 0 0  Anxiety Difficulty  Not difficult at all Not difficult at all Not difficult at all       05/06/2023    2:27 PM 05/01/2023   10:20 AM 03/05/2023    1:41 PM  Depression screen PHQ 2/9  Decreased Interest 1 0 1  Down, Depressed, Hopeless 1 0 1  PHQ - 2 Score 2 0 2  Altered sleeping 0 0 1  Tired, decreased energy 1 0 2  Change in appetite 1 0 1  Feeling bad or failure about yourself  1 0 1  Trouble concentrating 1 0 1  Moving slowly or fidgety/restless 1 0 1  Suicidal thoughts 1 0 0  PHQ-9 Score 8 0 9  Difficult doing work/chores  Not difficult at all Somewhat difficult    BP Readings from Last 3 Encounters:  05/06/23 124/68  03/05/23 128/74   02/10/23 104/76    Physical Exam Constitutional:      Appearance: Normal appearance.  Cardiovascular:     Rate and Rhythm: Normal rate and regular rhythm.     Heart sounds: No murmur heard. Pulmonary:     Effort: Pulmonary effort is normal.     Breath sounds: No wheezing or rhonchi.  Musculoskeletal:     Cervical back: Normal range of motion.     Right lower leg: No edema.     Left lower leg: No edema.  Lymphadenopathy:     Cervical: No cervical adenopathy.  Skin:    General: Skin is warm and dry.     Capillary Refill: Capillary refill takes less than 2 seconds.  Neurological:     General: No focal deficit present.     Mental Status: She is alert and oriented to person, place, and time.     Wt Readings from Last 3 Encounters:  05/06/23 134 lb 4 oz (60.9 kg)  05/01/23 130 lb (59 kg)  03/05/23 135 lb (61.2 kg)    BP 124/68   Pulse 88   Ht 4\' 11"  (1.499 m)   Wt 134 lb 4 oz (60.9 kg)   SpO2 98%   BMI 27.12 kg/m   Assessment and Plan:  Problem List Items Addressed This Visit       Unprioritized   Type II diabetes mellitus with complication (HCC) - Primary (Chronic)   Blood sugars stable without hypoglycemic symptoms or events. Currently managed with MTF, Jardiance, Ozempic and insulin. Changes made last visit are none.  She has noticed that her BS are running higher than usual, probably due to marital stresses. Lab Results  Component Value Date   HGBA1C 7.0 (A) 05/06/2023  Recommend increasing insulin dose to 25 units AM       Relevant Orders   POCT glycosylated hemoglobin (Hb A1C) (Completed)   Essential hypertension (Chronic)   Controlled BP with normal exam. Current regimen is metoprolol. Will continue same medications; encourage continued reduced sodium diet.       Hyperlipidemia associated  with type 2 diabetes mellitus (HCC) (Chronic)   LDL is  Lab Results  Component Value Date   LDLCALC 46 09/03/2022   Current regimen is atorvastatin 80 mg.   Tolerating medications well without issues.       CKD (chronic kidney disease) stage 3, GFR 30-59 ml/min (HCC) (Chronic)   Monitoring Last GFR 69      Other Visit Diagnoses       Long-term insulin use (HCC)           Return in about 4 months (around 09/05/2023) for CPX.    Reubin Milan, MD [facility] [place]

## 2023-05-06 NOTE — Assessment & Plan Note (Signed)
 LDL is  Lab Results  Component Value Date   LDLCALC 46 09/03/2022   Current regimen is atorvastatin 80 mg.  Tolerating medications well without issues.

## 2023-05-06 NOTE — Assessment & Plan Note (Signed)
 Controlled BP with normal exam. Current regimen is metoprolol. Will continue same medications; encourage continued reduced sodium diet.

## 2023-06-02 ENCOUNTER — Ambulatory Visit: Payer: Self-pay

## 2023-06-02 NOTE — Telephone Encounter (Addendum)
 Chief Complaint: Fall Symptoms: Fall on arm and leg Frequency: this morning Pertinent Negatives: Patient denies injury, hitting head Disposition: [] ED /[] Urgent Care (no appt availability in office) / [x] Appointment(In office/virtual)/ []  [place] [facility]/ [] Home Care/ [] Refused Recommended Disposition /[]  Mobile Bus/ []  Follow-up with PCP Additional Notes: Patient's husband called in stating patient fell in the shower this morning, due to believed to be dizziness. Patient's husband declined full triage but states he wants appt with PCP for evaluation for his wife. When RN attempted to assess, patient's husband stated he wanted an appt and PCP is aware of patient's background. Patient's husband states patient did not hit her head and does not have any injuries from her fall. Patient is not on any blood thinners, per husband. Appt made for soonest availability.    Copied from CRM [id number]. Topic: Clinical - Red Word Triage >> Jun 02, 2023  8:15 AM Tiffany B wrote: Red Word that prompted transfer to Nurse Triage: Caller states patient fell in the shower and hit her head. Additional Information  Commented on: [1] Caller has NON-URGENT question AND [2] triager unable to answer question    Patient's husband is not wanting full triage and requesting appt for PCP. Stating physician knows why he is bringing wife in.  Answer Assessment - Initial Assessment Questions 1. MECHANISM: "How did the fall happen?"     Got dizzy in the shower 2. DOMESTIC VIOLENCE AND ELDER ABUSE SCREENING: "Did you fall because someone pushed you or tried to hurt you?" If Yes, ask: "Are you safe now?"     No 3. ONSET: "When did the fall happen?" (e.g., minutes, hours, or days ago)     This morning 4. LOCATION: "What part of the body hit the ground?" (e.g., back, buttocks, head, hips, knees, hands, head, stomach)     Leg and arm  5. INJURY: "Did you hurt (injure) yourself when you fell?" If Yes, ask: "What did  you injure? Tell me more about this?" (e.g., body area; type of injury; pain severity)"     No 6. PAIN: "Is there any pain?" If Yes, ask: "How bad is the pain?" (e.g., Scale 1-10; or mild,  moderate, severe)   - NONE (0): No pain   - MILD (1-3): Doesn't interfere with normal activities    - MODERATE (4-7): Interferes with normal activities or awakens from sleep    - SEVERE (8-10): Excruciating pain, unable to do any normal activities      N/a 7. SIZE: For cuts, bruises, or swelling, ask: "How large is it?" (e.g., inches or centimeters)      No injuries  Protocols used: Falls and [facility]

## 2023-06-02 NOTE — Telephone Encounter (Signed)
 Noted   Pt has a appointment.  KP

## 2023-06-03 ENCOUNTER — Encounter: Payer: Self-pay | Admitting: Internal Medicine

## 2023-06-03 ENCOUNTER — Ambulatory Visit (INDEPENDENT_AMBULATORY_CARE_PROVIDER_SITE_OTHER): Admitting: Internal Medicine

## 2023-06-03 ENCOUNTER — Other Ambulatory Visit (HOSPITAL_COMMUNITY)
Admission: RE | Admit: 2023-06-03 | Discharge: 2023-06-03 | Disposition: A | Discharge status: Home / Self Care | Source: Ambulatory Visit | Attending: Internal Medicine | Admitting: Internal Medicine

## 2023-06-03 VITALS — BP 104/66 | HR 91 | Ht 59.0 in | Wt 133.0 lb

## 2023-06-03 DIAGNOSIS — N760 Acute vaginitis: Secondary | ICD-10-CM | POA: Diagnosis present

## 2023-06-03 DIAGNOSIS — I10 Essential (primary) hypertension: Secondary | ICD-10-CM | POA: Diagnosis not present

## 2023-06-03 DIAGNOSIS — R55 Syncope and collapse: Secondary | ICD-10-CM | POA: Insufficient documentation

## 2023-06-03 LAB — POCT URINALYSIS DIPSTICK
Bilirubin, UA: NEGATIVE
Blood, UA: NEGATIVE
Glucose, UA: POSITIVE — AB
Ketones, UA: NEGATIVE
Leukocytes, UA: NEGATIVE
Nitrite, UA: POSITIVE
Protein, UA: POSITIVE — AB
Spec Grav, UA: 1.01 (ref 1.010–1.025)
Urobilinogen, UA: 0.2 U/dL
pH, UA: 6.5 (ref 5.0–8.0)

## 2023-06-03 MED ORDER — FLUCONAZOLE 100 MG PO TABS: 100.0000 mg | ORAL_TABLET | Freq: Once | ORAL | 0 refills | Status: AC

## 2023-06-03 NOTE — Progress Notes (Signed)
 Date:  06/03/2023   Name:  Elizabeth Klein   DOB:  April 22, 1950   MRN:  [id number]   Chief Complaint: Fall (Patient said she had finished taking a shower, stepped out and felt dizzy and walked towards her bed and doesn't remember anything after that. Woke up on the floor, does not remember hitting her head or any injury)  Loss of Consciousness This is a new problem. Episode onset: two days ago. She lost consciousness for a period of 1 to 5 minutes. Exacerbated by: after showering. Associated symptoms include light-headedness. Pertinent negatives include no bladder incontinence, bowel incontinence, chest pain, dizziness, fever, headaches, nausea, slurred speech or weakness.  Vaginal Itching The patient's primary symptoms include genital itching and vaginal discharge. This is a new problem. The problem has been unchanged. The patient is experiencing no pain. Pertinent negatives include no chills, fever, headaches or nausea.  FSBS was high afterwards.  She had no residual symptoms other than mild head ache.  She has been eating and drinking normally. FSBS this AM is 280.  Review of Systems  Constitutional:  Negative for chills, fatigue and fever.  HENT:  Negative for trouble swallowing.   Respiratory:  Negative for chest tightness and shortness of breath.   Cardiovascular:  Positive for syncope. Negative for chest pain.  Gastrointestinal:  Negative for bowel incontinence and nausea.  Genitourinary:  Positive for vaginal discharge. Negative for bladder incontinence.  Musculoskeletal:  Positive for arthralgias. Negative for gait problem.  Neurological:  Positive for light-headedness. Negative for dizziness, speech difficulty, weakness, numbness and headaches.  Psychiatric/Behavioral:  Negative for dysphoric mood and sleep disturbance. The patient is not nervous/anxious.      Lab Results  Component Value Date   NA 141 09/03/2022   K 5.8 (H) 09/03/2022   CO2 22 09/03/2022   GLUCOSE 165 (H)  09/03/2022   BUN 20 09/03/2022   CREATININE 0.90 09/03/2022   CALCIUM 9.9 09/03/2022   EGFR 69 09/03/2022   GFRNONAA >60 03/06/2022   Lab Results  Component Value Date   CHOL 117 09/03/2022   HDL 47 09/03/2022   LDLCALC 46 09/03/2022   TRIG 139 09/03/2022   CHOLHDL 2.5 09/03/2022   Lab Results  Component Value Date   TSH 1.360 08/30/2021   Lab Results  Component Value Date   HGBA1C 7.0 (A) 05/06/2023   Lab Results  Component Value Date   WBC 8.7 09/03/2022   HGB 11.8 09/03/2022   HCT 37.7 09/03/2022   MCV 89 09/03/2022   PLT 401 09/03/2022   Lab Results  Component Value Date   ALT 17 09/03/2022   AST 17 09/03/2022   ALKPHOS 107 09/03/2022   BILITOT 0.4 09/03/2022   Lab Results  Component Value Date   VD25OH 38.6 08/30/2021     Patient Active Problem List   Diagnosis Date Noted   Vasovagal syncope 06/03/2023   Migraine without aura and without status migrainosus, not intractable 02/10/2023   Psoriasis 02/10/2023   Impingement syndrome of shoulder, right 07/09/2021   Cervical paraspinal muscle spasm 07/09/2021   Degeneration of lumbar intervertebral disc 04/19/2021   Oropharyngeal dysphagia 02/23/2019   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   CKD (chronic kidney disease) stage 3, GFR 30-59 ml/min (HCC) 04/30/2017   Type II diabetes mellitus with complication (HCC) 03/19/2017   Essential hypertension 03/19/2017   CAD (coronary artery disease) 03/19/2017   History of kidney stones 03/19/2017   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  Hyperlipidemia associated with type 2 diabetes mellitus (HCC) 03/19/2017   Nocturnal leg cramps 03/19/2017   Primary insomnia 03/19/2017   Allergic rhinitis 03/19/2017    Allergies  Allergen Reactions   Prednisone Other (See Comments)    Intolerance  - confusion     Past Surgical History:  Procedure Laterality Date   BREAST BIOPSY Right    neg   CATARACT EXTRACTION W/PHACO Left 09/27/2019   Procedure:  CATARACT EXTRACTION PHACO AND INTRAOCULAR LENS PLACEMENT (IOC) LEFT DIABETIC ISTENT INJ INTRAVITREAL KENALOG INJ;  Surgeon: Nevada Crane, MD;  Location: [facility];  Service: Ophthalmology;  Laterality: Left;  2.83 0:28.7   CATARACT EXTRACTION W/PHACO Right 12/13/2019   Procedure: CATARACT EXTRACTION PHACO AND INTRAOCULAR LENS PLACEMENT (IOC) RIGHT ISTENT INJ KENALOG INJ DIABETIC;  Surgeon: Nevada Crane, MD;  Location: [facility];  Service: Ophthalmology;  Laterality: Right;  1.39 0:21.5   CESAREAN SECTION      Social History   Tobacco Use   Smoking status: Never   Smokeless tobacco: Never  Vaping Use   Vaping status: Never Used  Substance Use Topics   Alcohol use: Never   Drug use: Never     Medication list has been reviewed and updated.  Current Meds  Medication Sig   acetaminophen (TYLENOL) 500 MG tablet Take 1,000 mg by mouth every 8 (eight) hours as needed.   Ascorbic Acid (VITAMIN C PO) Take by mouth.   aspirin EC 81 MG tablet Take 1 tablet (81 mg total) by mouth daily.   atorvastatin (LIPITOR) 80 MG tablet Take 1 tablet (80 mg total) by mouth daily.   blood glucose meter kit and supplies Dispense based on patient and insurance preference. Use up to four times daily as directed. (FOR ICD-10 E10.9, E11.9).   clobetasol (TEMOVATE) 0.05 % external solution Apply topically 2 (two) times daily. To scalp lesions   clobetasol ointment (TEMOVATE) 0.05 % Apply 1 application topically 2 (two) times daily.   empagliflozin (JARDIANCE) 25 MG TABS tablet TAKE 1 TABLET BY MOUTH DAILY  BEFORE BREAKFAST   fluconazole (DIFLUCAN) 100 MG tablet Take 1 tablet (100 mg total) by mouth once for 1 dose.   gabapentin (NEURONTIN) 100 MG capsule TAKE 1 CAPSULE BY MOUTH 3 TIMES  DAILY   glimepiride (AMARYL) 2 MG tablet TAKE 1 TABLET BY MOUTH DAILY  WITH BREAKFAST   glucose blood (ACCU-CHEK GUIDE TEST) test strip USE TO CHECK BLOOD SUGAR 3 TIMES DAILY   hydrocortisone 2.5 %  ointment Apply topically 2 (two) times daily. To lesion on arm and buttocks   insulin glargine (LANTUS) 100 UNIT/ML injection Inject 0.4 mLs (40 Units total) into the skin every morning. Pt taking QAM (Patient taking differently: Inject 25 Units into the skin every morning. Pt taking QAM)   Insulin Syringe-Needle U-100 (BD INSULIN SYRINGE U/F) 30G X 1/2" 0.5 ML MISC USE WITH LANTUS INJECTIONS FOR  TYPE 2 DIABETES MELLITUS   meclizine (ANTIVERT) 12.5 MG tablet Take 1 tablet (12.5 mg total) by mouth 3 (three) times daily as needed for dizziness.   meloxicam (MOBIC) 7.5 MG tablet Take 1 tablet (7.5 mg total) by mouth daily.   metFORMIN (GLUCOPHAGE) 1000 MG tablet Take 1 tablet (1,000 mg total) by mouth daily with breakfast.   metoprolol tartrate (LOPRESSOR) 50 MG tablet TAKE 1 TABLET BY MOUTH TWICE  DAILY   montelukast (SINGULAIR) 10 MG tablet TAKE 1 TABLET BY MOUTH DAILY   Omega-3 Fatty Acids (FISH OIL) 1000 MG CAPS  Take by mouth.   omeprazole (PRILOSEC) 20 MG capsule TAKE 1 CAPSULE BY MOUTH DAILY   ondansetron (ZOFRAN) 4 MG tablet Take 4 mg by mouth every 8 (eight) hours as needed.   ONETOUCH DELICA PLUS LANCETS MISC 1 each by Does not apply route 2 (two) times daily.   OZEMPIC, 2 MG/DOSE, 8 MG/3ML SOPN Inject 2 mg into the skin once a week.   traZODone (DESYREL) 50 MG tablet TAKE 1 TABLET BY MOUTH AT  BEDTIME   vitamin B-12 (CYANOCOBALAMIN) 1000 MCG tablet Take 1,000 mcg by mouth daily.       06/03/2023    8:47 AM 05/06/2023    2:27 PM 03/05/2023    1:41 PM 02/10/2023    2:03 PM  GAD 7 : Generalized Anxiety Score  Nervous, Anxious, on Edge 1 1 1  0  Control/stop worrying 1 1 1  0  Worry too much - different things 1 1 1  0  Trouble relaxing 1 1 0 0  Restless 1 1 0 0  Easily annoyed or irritable 2 1 0 0  Afraid - awful might happen 1 1 0 0  Total GAD 7 Score 8 7 3  0  Anxiety Difficulty Somewhat difficult  Not difficult at all Not difficult at all       06/03/2023    8:47 AM 05/06/2023     2:27 PM 05/01/2023   10:20 AM  Depression screen PHQ 2/9  Decreased Interest 2 1 0  Down, Depressed, Hopeless 0 1 0  PHQ - 2 Score 2 2 0  Altered sleeping 1 0 0  Tired, decreased energy 2 1 0  Change in appetite 1 1 0  Feeling bad or failure about yourself  1 1 0  Trouble concentrating 1 1 0  Moving slowly or fidgety/restless 2 1 0  Suicidal thoughts 0 1 0  PHQ-9 Score 10 8 0  Difficult doing work/chores   Not difficult at all    BP Readings from Last 3 Encounters:  06/03/23 104/66  05/06/23 124/68  03/05/23 128/74    Physical Exam Vitals and nursing note reviewed.  Constitutional:      General: She is not in acute distress.    Appearance: Normal appearance. She is well-developed.  HENT:     Head: Normocephalic and atraumatic.  Neck:     Vascular: No carotid bruit.  Cardiovascular:     Rate and Rhythm: Normal rate and regular rhythm.  Pulmonary:     Effort: Pulmonary effort is normal. No respiratory distress.     Breath sounds: No wheezing or rhonchi.  Abdominal:     General: Abdomen is flat.     Palpations: Abdomen is soft.     Tenderness: There is no abdominal tenderness. There is no right CVA tenderness or left CVA tenderness.  Musculoskeletal:     Cervical back: Normal range of motion.     Right lower leg: No edema.     Left lower leg: No edema.  Lymphadenopathy:     Cervical: No cervical adenopathy.  Skin:    General: Skin is warm and dry.     Findings: No rash.  Neurological:     General: No focal deficit present.     Mental Status: She is alert and oriented to person, place, and time.     Sensory: Sensation is intact.     Motor: Motor function is intact.     Coordination: Coordination is intact.  Psychiatric:  Mood and Affect: Mood normal.        Behavior: Behavior normal.    Lab Results  Component Value Date   COLORU yellow 06/03/2023   CLARITYU clear 06/03/2023   GLUCOSEUR Positive (A) 06/03/2023   BILIRUBINUR negative 06/03/2023    KETONESU negative 06/03/2023   SPECGRAV 1.010 06/03/2023   RBCUR negative 06/03/2023   PHUR 6.5 06/03/2023   PROTEINUR Positive (A) 06/03/2023   UROBILINOGEN 0.2 06/03/2023   LEUKOCYTESUR Negative 06/03/2023     Wt Readings from Last 3 Encounters:  06/03/23 133 lb (60.3 kg)  05/06/23 134 lb 4 oz (60.9 kg)  05/01/23 130 lb (59 kg)    BP 104/66   Pulse 91   Ht 4\' 11"  (1.499 m)   Wt 133 lb (60.3 kg)   SpO2 95%   BMI 26.86 kg/m   Assessment and Plan:  Problem List Items Addressed This Visit       Unprioritized   Essential hypertension (Chronic)   BP low today on metoprolol 50 mg bid Will reduce dose to 25 mg bid Follow up one month.      Vasovagal syncope - Primary   New onset after showering 2 days ago. No residual noted.  Will rule out UTI, reduce metoprolol dose Obtain Carotid US      Relevant Orders   EKG 12-Lead (Completed)   US Carotid Duplex Bilateral   Other Visit Diagnoses       Acute vaginitis       Relevant Medications   fluconazole (DIFLUCAN) 100 MG tablet   Other Relevant Orders   POCT urinalysis dipstick (Completed)   Cervicovaginal ancillary only       Return in about 1 month (around 07/03/2023) for HTN.    Reubin Milan, MD [facility] [place]

## 2023-06-03 NOTE — Assessment & Plan Note (Signed)
 New onset after showering 2 days ago. No residual noted.  Will rule out UTI, reduce metoprolol dose Obtain Carotid US

## 2023-06-03 NOTE — Patient Instructions (Addendum)
 Cut the metoprolol in half and take 25 mg twice a day. Follow up in one month.  I have ordered a Carotid US - someone will call you with that appointment.  Take Diflucan 100 mg once - sent to pharmacy.

## 2023-06-03 NOTE — Assessment & Plan Note (Signed)
 BP low today on metoprolol 50 mg bid Will reduce dose to 25 mg bid Follow up one month.

## 2023-06-04 ENCOUNTER — Encounter: Payer: Self-pay | Admitting: Internal Medicine

## 2023-06-04 LAB — CERVICOVAGINAL ANCILLARY ONLY
Bacterial Vaginitis (gardnerella): NEGATIVE
Candida Glabrata: NEGATIVE
Candida Vaginitis: POSITIVE — AB
Chlamydia: NEGATIVE
Comment: NEGATIVE
Comment: NEGATIVE
Comment: NEGATIVE
Comment: NEGATIVE
Comment: NEGATIVE
Comment: NORMAL
Neisseria Gonorrhea: NEGATIVE
Trichomonas: NEGATIVE

## 2023-06-06 DIAGNOSIS — Z961 Presence of intraocular lens: Secondary | ICD-10-CM | POA: Diagnosis not present

## 2023-06-06 DIAGNOSIS — E113213 Type 2 diabetes mellitus with mild nonproliferative diabetic retinopathy with macular edema, bilateral: Secondary | ICD-10-CM | POA: Diagnosis not present

## 2023-06-09 NOTE — Unmapped (Signed)
 Dermatology Note     Assessment and Plan:      Prurigo nodularis - Severe, chronic, flared, extensively involving patients back, buttocks, bilateral upper and lower extremities, innumerable nodules (>20 nodules)  Chronic condition with exacerbation or progression.  - Patient previous on dupilumab  pen injections, but stopped because she disliked injecting herself with autopens. Since stopping treatment, patient is experience frequent and severe flare ups  - Patient advised to continue to use white cotton gloves when sleeping to prevent scratching.   - Advised against excoriation (discussed itch-scratch cycle), keep nails short  - Mosturization with Eucerin or Vaseline (purchase Cerave if desired), lukewarm showers, Dove sensitive skin soap to axilla/groin  - Start dupilumab  300 mg/mL syringes. Inject into the skin subcutaneously every 7 days. Prescription sent today  - Patient agreed to proceed with intralesional kenalog procedure, procedure note detailed below.    Intralesional Kenalog Procedure Note: After the patient was informed of risks (including atrophy and dyspigmentation), benefits and side effects of intralesional steroid injection, the patient elected to undergo injection and verbal consent was obtained. Skin was cleaned with alcohol and injected intralesionally into the sites (below). The patient tolerated the procedure well without complications and was instructed on post-procedure care.  Location(s): Buttock, Thighs, Back, Arms  Number of sites treated: >7  Kenalog (triamcinolone) Concentration: 10 mg/ml   Volume: 1 ml total    The patient was advised to call for an appointment should any new, changing, or symptomatic lesions develop.     RTC: Return in about 3 months (around 09/09/2023) for prurigo nodularis. or sooner as needed   _________________________________________________________________    Chief Complaint     Chief Complaint   Patient presents with    Skin Check     Pt coming in for 6 month FBSE.  No new areas of concern.        HPI     Monique Coleman is a 73 y.o. female who presents as a returning patient (last seen by Dr. Hobart Lulas on 12/10/2022) to Dermatology for a full body skin exam. Patient reports no specific lesions or rash of concern. At last visit, patient was to continue clobetasol  0.05% ointment for prurigo nodularis.    Prurigo Nodularis  - Patient reports worsening lesion across her both her arms and her shoulders. This rash has spread to her legs as well. The rash has also worsened significantly on her pack.  - She states that these lesions have worsened in the past few months.  - She did not like the dupixent  injections in the past because of the pain associated with the shots. She states that this medication caused more pain because it was cold.   - She endorses significant pruritus with these lesions.    The patient denies any other new or changing lesions or areas of concern.     Pertinent Past Medical History     No history of skin cancer    Family History:   Negative for melanoma    Past Medical History, Family History, Social History, Medication List, Allergies, and Problem List were reviewed in the rooming section of Epic.     ROS: Other than symptoms mentioned in the HPI, no fevers, chills, or other skin complaints    Physical Examination     GENERAL: Well-appearing female in no acute distress, resting comfortably.  NEURO: Alert and oriented, answers questions appropriately  PSYCH: Normal mood and affect  SKIN (Full Skin Exam): Examination of the face, eyelids, lips, nose,  ears, neck, chest, abdomen, back, arms, legs, hands, feet, palms, soles, nails was performed, including scalp, including buttocks  - innumerable hyperpigmented firm papules nodules and plaques on upper back lower back buttocks, bilateral upper and lower extremities, dorsal hands; > 20 lesions.    All areas not commented on are within normal limits or unremarkable    Scribe's Attestation: Junious Oliphant, MD, PhD obtained and performed the history, physical exam and medical decision making elements that were entered into the chart.  Signed by Clabe Crete, Scribe, on June 10, 2023 at 1:01 PM.    ----------------------------------------------------------------------------------------------------------------------  June 15, 2023 11:07 PM. Documentation assistance provided by the Scribe. I was present during the time the encounter was recorded. The information recorded by the Scribe was done at my direction and has been reviewed and validated by me.    Luvenia Salvage, MD  ----------------------------------------------------------------------------------------------------------------------

## 2023-06-10 ENCOUNTER — Ambulatory Visit
Admit: 2023-06-10 | Discharge: 2023-06-11 | Payer: Medicare (Managed Care) | Attending: Dermatology | Primary: Dermatology

## 2023-06-10 DIAGNOSIS — L281 Prurigo nodularis: Principal | ICD-10-CM

## 2023-06-10 MED ORDER — DUPIXENT 300 MG/2 ML SUBCUTANEOUS SYRINGE
11 refills | 0.00 days | Status: CP
Start: 2023-06-10 — End: ?

## 2023-06-10 MED ORDER — NALTREXONE 1.5 MG CAPSULE
ORAL_CAPSULE | Freq: Every day | ORAL | 11 refills | 0.00 days | Status: CN
Start: 2023-06-10 — End: ?

## 2023-06-10 MED ORDER — DUPILUMAB 300 MG/2 ML SUBCUTANEOUS SYRINGE
0 refills | 0.00 days | Status: CP
Start: 2023-06-10 — End: ?
  Filled 2023-06-24: qty 4, 14d supply, fill #0

## 2023-06-10 MED ORDER — DUPILUMAB 300 MG/2 ML SUBCUTANEOUS PEN INJECTOR
SUBCUTANEOUS | 10 refills | 28.00 days | Status: CN
Start: 2023-06-10 — End: ?

## 2023-06-11 ENCOUNTER — Encounter: Payer: Self-pay | Admitting: Internal Medicine

## 2023-06-11 ENCOUNTER — Ambulatory Visit
Admission: RE | Admit: 2023-06-11 | Discharge: 2023-06-11 | Disposition: A | Discharge status: Home / Self Care | Source: Ambulatory Visit | Attending: Internal Medicine | Admitting: Internal Medicine

## 2023-06-11 DIAGNOSIS — R55 Syncope and collapse: Secondary | ICD-10-CM | POA: Diagnosis not present

## 2023-06-11 DIAGNOSIS — E785 Hyperlipidemia, unspecified: Secondary | ICD-10-CM | POA: Diagnosis not present

## 2023-06-11 DIAGNOSIS — E119 Type 2 diabetes mellitus without complications: Secondary | ICD-10-CM | POA: Diagnosis not present

## 2023-06-11 NOTE — Unmapped (Signed)
 [facility] Specialty Medication Onboarding    Specialty Medication: Dupixent   Prior Authorization: Approved   Financial Assistance: No - copay  <$25  Final Copay/Day Supply: $12.15 / 14 days (LD) & 28 days (MD)    Insurance Restrictions: Yes - max 1 month supply     Notes to Pharmacist:   Credit Card on File: no  Start Date on Rx:      The triage team has completed the benefits investigation and has determined that the patient is able to fill this medication at [facility]. Please contact the patient to complete the onboarding or follow up with the prescribing physician as needed.

## 2023-06-11 NOTE — Unmapped (Signed)
 [facility]    Patient Onboarding/Medication Counseling    Monique Coleman is a 73 y.o. female with prurigo nodularis who I am counseling today on initiation of therapy.  I am speaking to the patient.    Was a Nurse, learning disability used for this call? No    Verified patient's date of birth / HIPAA.    Specialty medication(s) to be sent: Inflammatory Disorders: Dupixent      Non-specialty medications/supplies to be sent: sharps kit?      Medications not needed at this time: na         The patient declined counseling on medication administration, missed dose instructions, goals of therapy, side effects and monitoring parameters, warnings and precautions, drug/food interactions, and storage, handling precautions, and disposal because they have taken the medication previously. The information in the declined sections below are for informational purposes only and was not discussed with patient.     Briefly reviewed injection training - uses syringes for insulin. We did review dosing.       Dupixent (dupilumab)    Medication & Administration     Dosage: Prurigo Nodularis: Inject 600mg  under the skin as a loading dose followed by 300mg  every 14 days thereafter     Administration:     Dupixent Syringe  1. Gather all supplies needed for injection on a clean, flat working surface: medication syringe removed from packaging, alcohol swab, sharps container, etc.  2. Look at the medication label - look for correct medication, correct dose, and check the expiration date  3. Look at the medication - the liquid in the syringe should appear clear and colorless to pale yellow  4. Lay the syringe on a flat surface and allow it to warm up to room temperature for at least 45 minutes  5. Select injection site - you can use the front of your thigh or your belly (but not the area 2 inches around your belly button); if someone else is giving you the injection you can also use your upper arm in the skin covering your triceps muscle  6. Prepare injection site - wash your hands and clean the skin at the injection site with an alcohol swab and let it air dry, do not touch the injection site again before the injection  7. Hold the middle of the body of the syringe and gently pull the needle safety cap straight out. Be careful not to bend the needle. Do not remove until immediately prior to injection  8. Pinch the skin - with your hand not holding the syringe pinch up a fold of skin at the injection site using your forefinger and thumb  9. Insert the needle into the fold of skin at about a 45 degree angle - it's best to use a quick dart-like motion - with the syringe in position, release the pinch of skin and allow the skin to relax  10. Push the plunger down slowly as far as it will go until the syringe is empty, if the plunger is not fully depressed the needle shield will not extend to cover the needle when it is removed  11. Check that the syringe is empty and keep pressing down on the plunger while you pull the needle out at the same angle as inserted; after the needle is removed completely from the skin, release the plunger allowing the needle shield to activate and cover the used needle  12. Dispose of the used syringe immediately in your sharps disposal  container  13. If you see any blood at the injection site, press a cotton ball or gauze on the site and maintain pressure until the bleeding stops, do not rub the injection site    Adherence/Missed dose instructions:  If a dose is missed, administer within 7 days from the missed dose and then resume the original schedule. If the missed dose is not administered within 7 days, you can either wait until the next dose on the original schedule or take your dose now and resume every 14 days from the new injection date. Do not use 2 doses at the same time or extra doses.      Goals of Therapy     -Reduce symptoms of pruritus and dermatitis  -Prevent exacerbations  -Minimize therapeutic risks  -Avoidance of long-term systemic and topical glucocorticoid use  -Maintenance of effective psychosocial functioning    Side Effects & Monitoring Parameters     Injection site reaction (redness, irritation, inflammation localized to the site of administration)  Signs of a common cold - minor sore throat, runny or stuffy nose, etc.  Recurrence of cold sores (herpes simplex)      The following side effects should be reported to the provider:  Signs of a hypersensitivity reaction - rash; hives; itching; red, swollen, blistered, or peeling skin; wheezing; tightness in the chest or throat; difficulty breathing, swallowing, or talking; swelling of the mouth, face, lips, tongue, or throat; etc.  Eye pain or irritation or any visual disturbances  Shortness of breath or worsening of breathing      Contraindications, Warnings, & Precautions     Have your bloodwork checked as you have been told by your prescriber   Birth control pills and other hormone-based birth control may not work as well to prevent pregnancy  Talk with your doctor if you are pregnant, planning to become pregnant, or breastfeeding  Discuss the possible need for holding your dose(s) of Dupixent?? when a planned procedure is scheduled with the prescriber as it may delay healing/recovery timeline       Drug/Food Interactions     Medication list reviewed in Epic. The patient was instructed to inform the care team before taking any new medications or supplements. No drug interactions identified.   Talk with you prescriber or pharmacist before receiving any live vaccinations while taking this medication and after you stop taking it    Storage, Handling Precautions, & Disposal     Store this medication in the refrigerator.  Do not freeze  If needed, you may store at room temperature for up to 14 days  Store in original packaging, protected from light  Do not shake  Dispose of used syringes in a sharps disposal container            Current Medications (including OTC/herbals), Comorbidities and Allergies     Current Outpatient Medications   Medication Sig Dispense Refill    ACCU-CHEK GUIDE TEST STRIPS Strp       aspirin (ECOTRIN) 81 MG tablet Take 1 tablet (81 mg total) by mouth daily.      atorvastatin (LIPITOR) 80 MG tablet Take 1 tablet (80 mg total) by mouth daily.      blood sugar diagnostic Strp USE 3 TIMES DAILY      clobetasol (TEMOVATE) 0.05 % ointment Apply up to two times per day as needed to itchy areas on skin or scalp 60 g 3    dulaglutide (TRULICITY) 0.75 mg/0.5 mL injection pen Inject 0.5 mL (  0.75 mg total) under the skin once a week.      dupilumab (DUPIXENT SYRINGE) 300 mg/2 mL syringe Inject the contents of 1 syringe (300 mg total) under the skin every fourteen (14) days. 8 mL 11    dupilumab (DUPIXENT) 300 mg/2 mL syringe Inject the contents of 2 pens (600mg ) under the skin for loading dose 4 mL 0    dupilumab 300 mg/2 mL PnIj Inject the contents of 1 pen (300 mg) under the skin every fourteen (14) days as maintenance. (Patient not taking: Reported on 12/10/2022) 4 mL 10    empty container Misc Use as directed to dispose of Dupixent 1 each 2    gabapentin (NEURONTIN) 100 MG capsule       glimepiride (AMARYL) 2 MG tablet Take 1 tablet (2 mg total) by mouth daily before breakfast.      insulin glargine (LANTUS) 100 unit/mL injection Inject 0.4 mL (40 Units total) under the skin nightly.      irbesartan (AVAPRO) 300 MG tablet Take 1 tablet (300 mg total) by mouth nightly. (Patient not taking: Reported on 12/10/2022)      losartan (COZAAR) 50 MG tablet Take 1 tablet (50 mg total) by mouth daily. (Patient not taking: Reported on 12/10/2022)      meloxicam (MOBIC) 7.5 MG tablet Take 1 tablet (7.5 mg total) by mouth daily. (Patient not taking: Reported on 12/10/2022)      metFORMIN (GLUCOPHAGE) 1000 MG tablet Take 1 tablet (1,000 mg total) by mouth in the morning and 1 tablet (1,000 mg total) in the evening. Take with meals. metoprolol tartrate (LOPRESSOR) 50 MG tablet Take 1 tablet (50 mg total) by mouth two (2) times a day.      montelukast (SINGULAIR) 10 mg tablet Take 1 tablet (10 mg total) by mouth nightly.      mupirocin (BACTROBAN) 2 % ointment Apply topically Three (3) times a day. To affected area BID  till healed (Patient not taking: Reported on 12/10/2022) 15 g 1    naltrexone 1.5 mg cap Take 3 mg by mouth in the morning. (Patient not taking: Reported on 12/10/2022) 30 capsule 11    omeprazole (PRILOSEC) 20 MG capsule Take 1 capsule (20 mg total) by mouth daily.      OZEMPIC 2 mg/dose (8 mg/3 mL) PnIj       traZODone (DESYREL) 50 MG tablet Take 1 tablet (50 mg total) by mouth nightly.       No current facility-administered medications for this visit.       No Known Allergies    Patient Active Problem List   Diagnosis    Severe episode of recurrent major depressive disorder, without psychotic features    Generalized anxiety disorder    Adjustment disorder       Medication list has been reviewed and updated in Epic: Yes    Allergies have been reviewed and updated in Epic: Yes    Appropriateness of Therapy     Acute infections noted within Epic:  No active infections  Patient reported infection: None    Is the medication and dose appropriate based on diagnosis, medication list, comorbidities, allergies, medical history, patient???s ability to self-administer the medication, and therapeutic goals? Yes    Prescription has been clinically reviewed: Yes      Baseline Quality of Life Assessment      How many days over the past month did your PN  keep you from your normal activities? For example, brushing your teeth  or getting up in the morning. Patient declined to answer    Financial Information     Medication Assistance provided: Prior Authorization    Anticipated copay of $12.15 reviewed with patient. Verified delivery address.    Delivery Information     Scheduled delivery date: 4/29 for load then 5/12 for maintenance dose    Expected start date: 4/29      Medication will be delivered via Same Day Courier to the prescription address in [facility].  This shipment will not require a signature.      Explained the services we provide at [facility] and that each month we would call to set up refills.  Stressed importance of returning phone calls so that we could ensure they receive their medications in time each month.  Informed patient that we should be setting up refills 7-10 days prior to when they will run out of medication.  A pharmacist will reach out to perform a clinical assessment periodically.  Informed patient that a welcome packet, containing information about our pharmacy and other support services, a Notice of Privacy Practices, and a drug information handout will be sent.      The patient or caregiver noted above participated in the development of this care plan and knows that they can request review of or adjustments to the care plan at any time.      Patient or caregiver verbalized understanding of the above information as well as how to contact the pharmacy at [phone] option 4 with any questions/concerns.  The pharmacy is open Monday through Friday 8:30am-4:30pm.  A pharmacist is available 24/7 via pager to answer any clinical questions they may have.    Patient Specific Needs     Does the patient have any physical, cognitive, or cultural barriers? No    Does the patient have adequate living arrangements? (i.e. the ability to store and take their medication appropriately) Yes    Did you identify any home environmental safety or security hazards? No    Patient prefers to have medications discussed with  Patient     Is the patient or caregiver able to read and understand education materials at a high school level or above? Yes    Patient's primary language is  English     Is the patient high risk? No    Does the patient have an additional or emergency contact listed in their chart? Yes    SOCIAL DETERMINANTS OF HEALTH     At the [facility], we have learned that life circumstances - like trouble affording food, housing, utilities, or transportation can affect the health of many of our patients.   That is why we wanted to ask: are you currently experiencing any life circumstances that are negatively impacting your health and/or quality of life? Patient declined to answer    Social Drivers of Health     Food Insecurity: No Food Insecurity (05/01/2023)    Received from [facility]    Hunger Vital Sign     Worried About Running Out of Food in the Last Year: Never true     Ran Out of Food in the Last Year: Never true   Tobacco Use: Low Risk  (06/03/2023)    Received from [facility]    Patient History     Smoking Tobacco Use: Never     Smokeless Tobacco Use: Never     Passive Exposure: Not on file   Transportation Needs: No  Transportation Needs (05/01/2023)    Received from [facility] - Transportation     Lack of Transportation (Medical): No     Lack of Transportation (Non-Medical): No   Alcohol Use: Not on file   Housing: Not on file   Physical Activity: Inactive (05/01/2023)    Received from [facility]    Exercise Vital Sign     Days of Exercise per Week: 0 days     Minutes of Exercise per Session: 0 min   Utilities: Not At Risk (05/01/2023)    Received from [facility]ities     Threatened with loss of utilities: No   Stress: No Stress Concern Present (05/01/2023)    Received from [facility] - Occupational Stress Questionnaire     Feeling of Stress : Only a little   Interpersonal Safety: Not on file   Substance Use: Not on file (12/31/2022)   Intimate Partner Violence: Not At Risk (05/01/2023)    Received from [facility]    Humiliation, Afraid, Rape, and Kick questionnaire     Fear of Current or Ex-Partner: No     Emotionally Abused: No     Physically Abused: No     Sexually Abused: No   Social Connections: Moderately Integrated (05/01/2023)    Received from [facility]    Social Connection and Isolation Panel [NHANES]     Frequency of Communication with Friends and Family: More than three times a week     Frequency of Social Gatherings with Friends and Family: Twice a week     Attends Religious Services: More than 4 times per year     Active Member of [employer] or Organizations: No     Attends Banker Meetings: Never     Marital Status: Married   Programmer, applications: Low Risk  (05/01/2023)    Received from [employer] [facility]    Overall Financial Resource Strain (CARDIA)     Difficulty of Paying Living Expenses: Not hard at all   Depression: Not on file   Internet Connectivity: Not on file   Health Literacy: Adequate Health Literacy (05/01/2023)    Received from [facility]    B1300 Health Literacy     Frequency of need for help with medical instructions: Never       Would you be willing to receive help with any of the needs that you have identified today? Not applicable       Nori Winegar [facility] Specialty Pharmacist

## 2023-06-13 ENCOUNTER — Other Ambulatory Visit: Payer: Self-pay | Admitting: Internal Medicine

## 2023-06-13 DIAGNOSIS — L281 Prurigo nodularis: Principal | ICD-10-CM

## 2023-06-13 DIAGNOSIS — L409 Psoriasis, unspecified: Secondary | ICD-10-CM

## 2023-06-13 MED ORDER — CLOBETASOL 0.05 % TOPICAL OINTMENT
0 refills | 0.00 days
Start: 2023-06-13 — End: ?

## 2023-06-16 DIAGNOSIS — L281 Prurigo nodularis: Principal | ICD-10-CM

## 2023-06-16 MED ORDER — CLOBETASOL 0.05 % TOPICAL OINTMENT
0 refills | 0.00 days | Status: CP
Start: 2023-06-16 — End: ?

## 2023-06-16 NOTE — Telephone Encounter (Signed)
 Requested medications are due for refill today.  yes  Requested medications are on the active medications list.  yes  Last refill. 02/10/2023 50mL 0 rf  Future visit scheduled.   yes  Notes to clinic.  Refill not delegated.    Requested Prescriptions  Pending Prescriptions Disp Refills   clobetasol  (TEMOVATE ) 0.05 % external solution [Pharmacy Med Name: CLOBETASOL  PROP SOL 0.05%] 50 mL 0    Sig: APPLY TOPICALLY TWICE DAILY TO  SCALP LESIONS     Not Delegated - Dermatology:  Corticosteroids Failed - 06/16/2023  8:01 AM      Failed - This refill cannot be delegated      Passed - Valid encounter within last 12 months    Recent Outpatient Visits           1 week ago Vasovagal syncope   [place] [facility] at [facility], Chales [place], MD   1 month ago Type II diabetes mellitus with complication [facility])   [place] [facility] at [facility], Chales [place], MD       Future Appointments             In 2 months Gala Jubilee, [place]ales [place], MD [facility] Medicine at [facility], [facility]

## 2023-06-16 NOTE — Telephone Encounter (Signed)
 Med refill

## 2023-06-17 ENCOUNTER — Encounter: Attending: Internal Medicine | Admitting: Dietician

## 2023-06-17 DIAGNOSIS — Z713 Dietary counseling and surveillance: Secondary | ICD-10-CM | POA: Insufficient documentation

## 2023-06-17 DIAGNOSIS — E118 Type 2 diabetes mellitus with unspecified complications: Secondary | ICD-10-CM | POA: Insufficient documentation

## 2023-06-17 NOTE — Progress Notes (Signed)
 Pt reports falling a couple weeks ago, states they had a dizzy spell in the morning and passed out at home, home health nurse performed assessment after fall and found evidence of orthostatic hypotension.  Pt reports history of nocturnal hypoglycemia, states it is becoming less frequent  Pt reports family members are doctors in [place], states they are a very good support system for controlling their diabetes.  Pt reports taking Metformin  once nightly, Empagliflozin , Glimepiride , Ozempic @2  mg, and Lantus  @25u  in the morning for their DM. Pt reports using CGM [facility]), but did not bring receiver to appointment.  Pt reports having psoriasis, states they have had a recent flare up and their FBG values have been higher since.  Pt reports eating 3 meals every day.    CGM overview CGM and Nutrition

## 2023-06-17 NOTE — Patient Instructions (Addendum)
 Pay attention to how high your blood sugar goes after you eat your meals. You want your blood sugar to rise between 40 - 60 mg/dL after a meal at most, and stay under 180 mg/dL at all times. If you notice a food makes your blood sugar go higher than that, have a smaller portion next time.  Use your CGM handouts to help you understand the information that your receiver provides in the menu.  Desired Ranges on GCM Report: Very Low (below 54 mg/dL): <1% Low (below 70 mg/dL): <6% Time in Range (70 - 180 mg/dL): >10% High (960 - 454 mg/dL): <09% Very High (above 250 mg/dL): <8%  Your goal J1B is below 7.0%  Take a short 10-15 minute walk after your lunch meal each day. This will help to keep your blood sugar.   Take your Metformin  AFTER your breakfast on a full stomach!

## 2023-06-18 ENCOUNTER — Encounter: Payer: Self-pay | Admitting: Dietician

## 2023-06-23 MED ORDER — EMPTY CONTAINER
2 refills | 0.00000 days
Start: 2023-06-23 — End: ?

## 2023-06-24 MED FILL — EMPTY CONTAINER: 120 days supply | Qty: 1 | Fill #0

## 2023-06-28 ENCOUNTER — Other Ambulatory Visit: Payer: Self-pay | Admitting: Internal Medicine

## 2023-06-28 DIAGNOSIS — I2510 Atherosclerotic heart disease of native coronary artery without angina pectoris: Secondary | ICD-10-CM

## 2023-06-28 DIAGNOSIS — J309 Allergic rhinitis, unspecified: Secondary | ICD-10-CM

## 2023-06-28 DIAGNOSIS — I10 Essential (primary) hypertension: Secondary | ICD-10-CM

## 2023-07-01 NOTE — Telephone Encounter (Signed)
 Requested Prescriptions  Pending Prescriptions Disp Refills   metoprolol  tartrate (LOPRESSOR ) 50 MG tablet [Pharmacy Med Name: Metoprolol  Tartrate 50 MG Oral Tablet] 200 tablet 0    Sig: TAKE 1 TABLET BY MOUTH TWICE  DAILY     Cardiovascular:  Beta Blockers Passed - 07/01/2023 11:07 AM      Passed - Last BP in normal range    BP Readings from Last 1 Encounters:  06/03/23 104/66         Passed - Last Heart Rate in normal range    Pulse Readings from Last 1 Encounters:  06/03/23 91         Passed - Valid encounter within last 6 months    Recent Outpatient Visits           4 weeks ago Vasovagal syncope   [place] [facility] at [facility], Chales [place], MD   1 month ago Type II diabetes mellitus with complication [facility])   [place] [facility] at [facility], Chales [place], MD       Future Appointments             In 2 months Gala Jubilee [place], MD [facility] at [facility]             montelukast  (SINGULAIR ) 10 MG tablet [Pharmacy Med Name: Montelukast  Sodium 10 MG Oral Tablet] 100 tablet 0    Sig: TAKE 1 TABLET BY MOUTH DAILY     Pulmonology:  Leukotriene Inhibitors Passed - 07/01/2023 11:07 AM      Passed - Valid encounter within last 12 months    Recent Outpatient Visits           4 weeks ago Vasovagal syncope   [place] [facility] at [facility], Chales [place], MD   1 month ago Type II diabetes mellitus with complication [facility])   [place] [facility] at [facility], Chales [place], MD       Future Appointments             In 2 months Gala Jubilee, [place], MD [facility] Medicine at [facility], [facility]

## 2023-07-03 ENCOUNTER — Other Ambulatory Visit: Payer: Self-pay | Admitting: Internal Medicine

## 2023-07-03 DIAGNOSIS — E118 Type 2 diabetes mellitus with unspecified complications: Secondary | ICD-10-CM

## 2023-07-04 ENCOUNTER — Ambulatory Visit (INDEPENDENT_AMBULATORY_CARE_PROVIDER_SITE_OTHER): Admitting: Internal Medicine

## 2023-07-04 ENCOUNTER — Encounter: Payer: Self-pay | Admitting: Internal Medicine

## 2023-07-04 VITALS — BP 114/64 | HR 83 | Ht 59.0 in | Wt 132.4 lb

## 2023-07-04 DIAGNOSIS — I10 Essential (primary) hypertension: Secondary | ICD-10-CM | POA: Diagnosis not present

## 2023-07-04 DIAGNOSIS — R110 Nausea: Secondary | ICD-10-CM

## 2023-07-04 DIAGNOSIS — I2510 Atherosclerotic heart disease of native coronary artery without angina pectoris: Secondary | ICD-10-CM

## 2023-07-04 MED ORDER — ONDANSETRON HCL 4 MG PO TABS: 4.0000 mg | ORAL_TABLET | Freq: Three times a day (TID) | ORAL | 0 refills | Status: DC | PRN

## 2023-07-04 MED ORDER — METOPROLOL TARTRATE 25 MG PO TABS: 25.0000 mg | ORAL_TABLET | Freq: Two times a day (BID) | ORAL | 1 refills | Status: AC

## 2023-07-04 NOTE — Assessment & Plan Note (Signed)
 Blood pressure is well controlled.  Current medications metoprolol  25 mg bid.  Dose changed last visit due to low BP. Will continue same regimen along with efforts to limit dietary sodium.

## 2023-07-04 NOTE — Progress Notes (Signed)
 Date:  07/04/2023   Name:  Elizabeth Klein   DOB:  Aug 04, 1950   MRN:  [id number]   Chief Complaint: Hypertension  Hypertension This is a chronic problem. The problem is controlled. Associated symptoms include headaches. Pertinent negatives include no chest pain, palpitations or shortness of breath. Past treatments include beta blockers (dose reduced last visit).    Review of Systems  Constitutional:  Negative for fatigue and unexpected weight change.  HENT:  Negative for trouble swallowing.   Eyes:  Negative for visual disturbance.  Respiratory:  Negative for cough, chest tightness, shortness of breath and wheezing.   Cardiovascular:  Negative for chest pain, palpitations and leg swelling.  Gastrointestinal:  Negative for abdominal pain, constipation and diarrhea.  Musculoskeletal:  Negative for arthralgias and myalgias.  Skin:  Positive for rash (much improved since starting Dupixent).  Neurological:  Positive for headaches. Negative for dizziness, weakness and light-headedness.     Lab Results  Component Value Date   NA 141 09/03/2022   K 5.8 (H) 09/03/2022   CO2 22 09/03/2022   GLUCOSE 165 (H) 09/03/2022   BUN 20 09/03/2022   CREATININE 0.90 09/03/2022   CALCIUM  9.9 09/03/2022   EGFR 69 09/03/2022   GFRNONAA >60 03/06/2022   Lab Results  Component Value Date   CHOL 117 09/03/2022   HDL 47 09/03/2022   LDLCALC 46 09/03/2022   TRIG 139 09/03/2022   CHOLHDL 2.5 09/03/2022   Lab Results  Component Value Date   TSH 1.360 08/30/2021   Lab Results  Component Value Date   HGBA1C 7.0 (A) 05/06/2023   Lab Results  Component Value Date   WBC 8.7 09/03/2022   HGB 11.8 09/03/2022   HCT 37.7 09/03/2022   MCV 89 09/03/2022   PLT 401 09/03/2022   Lab Results  Component Value Date   ALT 17 09/03/2022   AST 17 09/03/2022   ALKPHOS 107 09/03/2022   BILITOT 0.4 09/03/2022   Lab Results  Component Value Date   VD25OH 38.6 08/30/2021     Patient Active Problem  List   Diagnosis Date Noted   Vasovagal syncope 06/03/2023   Migraine without aura and without status migrainosus, not intractable 02/10/2023   Psoriasis 02/10/2023   Impingement syndrome of shoulder, right 07/09/2021   Cervical paraspinal muscle spasm 07/09/2021   Degeneration of lumbar intervertebral disc 04/19/2021   Oropharyngeal dysphagia 02/23/2019   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   CKD (chronic kidney disease) stage 3, GFR 30-59 ml/min (HCC) 04/30/2017   Type II diabetes mellitus with complication (HCC) 03/19/2017   Essential hypertension 03/19/2017   CAD (coronary artery disease) 03/19/2017   History of kidney stones 03/19/2017   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   Hyperlipidemia associated with type 2 diabetes mellitus (HCC) 03/19/2017   Nocturnal leg cramps 03/19/2017   Primary insomnia 03/19/2017   Allergic rhinitis 03/19/2017    Allergies  Allergen Reactions   Prednisone Other (See Comments)    Intolerance  - confusion     Past Surgical History:  Procedure Laterality Date   BREAST BIOPSY Right    neg   CATARACT EXTRACTION W/PHACO Left 09/27/2019   Procedure: CATARACT EXTRACTION PHACO AND INTRAOCULAR LENS PLACEMENT (IOC) LEFT DIABETIC ISTENT INJ INTRAVITREAL KENALOG  INJ;  Surgeon: Rosa College, MD;  Location: [facility];  Service: Ophthalmology;  Laterality: Left;  2.83 0:28.7   CATARACT EXTRACTION W/PHACO Right 12/13/2019   Procedure: CATARACT EXTRACTION PHACO AND INTRAOCULAR LENS PLACEMENT (IOC)  RIGHT ISTENT INJ KENALOG  INJ DIABETIC;  Surgeon: Rosa College, MD;  Location: [facility];  Service: Ophthalmology;  Laterality: Right;  1.39 0:21.5   CESAREAN SECTION      Social History   Tobacco Use   Smoking status: Never   Smokeless tobacco: Never  Vaping Use   Vaping status: Never Used  Substance Use Topics   Alcohol use: Never   Drug use: Never     Medication list has been reviewed and  updated.  Current Meds  Medication Sig   acetaminophen  (TYLENOL ) 500 MG tablet Take 1,000 mg by mouth every 8 (eight) hours as needed.   Ascorbic Acid (VITAMIN C PO) Take by mouth.   aspirin  EC 81 MG tablet Take 1 tablet (81 mg total) by mouth daily.   atorvastatin  (LIPITOR) 80 MG tablet Take 1 tablet (80 mg total) by mouth daily.   blood glucose meter kit and supplies Dispense based on patient and insurance preference. Use up to four times daily as directed. (FOR ICD-10 E10.9, E11.9).   clobetasol  (TEMOVATE ) 0.05 % external solution APPLY TOPICALLY TWICE DAILY TO  SCALP LESIONS   clobetasol  ointment (TEMOVATE ) 0.05 % Apply 1 application topically 2 (two) times daily.   DUPIXENT 300 MG/2ML prefilled syringe Inject 300 mg into the skin once.   empagliflozin  (JARDIANCE ) 25 MG TABS tablet TAKE 1 TABLET BY MOUTH DAILY  BEFORE BREAKFAST   gabapentin  (NEURONTIN ) 100 MG capsule TAKE 1 CAPSULE BY MOUTH 3 TIMES  DAILY   glimepiride  (AMARYL ) 2 MG tablet TAKE 1 TABLET BY MOUTH DAILY  WITH BREAKFAST   glucose blood (ACCU-CHEK GUIDE TEST) test strip USE TO CHECK BLOOD SUGAR 3 TIMES DAILY   hydrocortisone  2.5 % ointment Apply topically 2 (two) times daily. To lesion on arm and buttocks   insulin  glargine (LANTUS ) 100 UNIT/ML injection Inject 0.4 mLs (40 Units total) into the skin every morning. Pt taking QAM (Patient taking differently: Inject 25 Units into the skin every morning. Pt taking QAM)   Insulin  Syringe-Needle U-100 (BD INSULIN  SYRINGE U/F) 30G X 1/2" 0.5 ML MISC USE WITH LANTUS  INJECTIONS FOR  TYPE 2 DIABETES MELLITUS   meloxicam  (MOBIC ) 7.5 MG tablet Take 1 tablet (7.5 mg total) by mouth daily.   metFORMIN  (GLUCOPHAGE ) 1000 MG tablet Take 1 tablet (1,000 mg total) by mouth daily with breakfast.   montelukast  (SINGULAIR ) 10 MG tablet TAKE 1 TABLET BY MOUTH DAILY   Omega-3 Fatty Acids (FISH OIL) 1000 MG CAPS Take by mouth.   omeprazole  (PRILOSEC) 20 MG capsule TAKE 1 CAPSULE BY MOUTH DAILY    ONETOUCH DELICA PLUS LANCETS MISC 1 each by Does not apply route 2 (two) times daily.   OZEMPIC, 2 MG/DOSE, 8 MG/3ML SOPN Inject 2 mg into the skin once a week.   traZODone  (DESYREL ) 50 MG tablet TAKE 1 TABLET BY MOUTH AT  BEDTIME   vitamin B-12 (CYANOCOBALAMIN) 1000 MCG tablet Take 1,000 mcg by mouth daily.   [DISCONTINUED] metoprolol  tartrate (LOPRESSOR ) 50 MG tablet TAKE 1 TABLET BY MOUTH TWICE  DAILY (Patient taking differently: Take 50 mg by mouth daily.)       06/03/2023    8:47 AM 05/06/2023    2:27 PM 03/05/2023    1:41 PM 02/10/2023    2:03 PM  GAD 7 : Generalized Anxiety Score  Nervous, Anxious, on Edge 1 1 1  0  Control/stop worrying 1 1 1  0  Worry too much - different things 1 1 1  0  Trouble relaxing  1 1 0 0  Restless 1 1 0 0  Easily annoyed or irritable 2 1 0 0  Afraid - awful might happen 1 1 0 0  Total GAD 7 Score 8 7 3  0  Anxiety Difficulty Somewhat difficult  Not difficult at all Not difficult at all       06/17/2023   11:15 AM 06/03/2023    8:47 AM 05/06/2023    2:27 PM  Depression screen PHQ 2/9  Decreased Interest 0 2 1  Down, Depressed, Hopeless 0 0 1  PHQ - 2 Score 0 2 2  Altered sleeping  1 0  Tired, decreased energy  2 1  Change in appetite  1 1  Feeling bad or failure about yourself   1 1  Trouble concentrating  1 1  Moving slowly or fidgety/restless  2 1  Suicidal thoughts  0 1  PHQ-9 Score  10 8    BP Readings from Last 3 Encounters:  07/04/23 114/64  06/03/23 104/66  05/06/23 124/68    Physical Exam Vitals and nursing note reviewed.  Constitutional:      General: She is not in acute distress.    Appearance: She is well-developed.  HENT:     Head: Normocephalic and atraumatic.  Cardiovascular:     Rate and Rhythm: Normal rate and regular rhythm.  Pulmonary:     Effort: Pulmonary effort is normal. No respiratory distress.     Breath sounds: No wheezing or rhonchi.  Musculoskeletal:     Right lower leg: No edema.     Left lower leg: No  edema.  Lymphadenopathy:     Cervical: No cervical adenopathy.  Skin:    General: Skin is warm and dry.     Findings: Lesion and rash present.  Neurological:     General: No focal deficit present.     Mental Status: She is alert and oriented to person, place, and time.  Psychiatric:        Mood and Affect: Mood normal.        Behavior: Behavior normal.     Wt Readings from Last 3 Encounters:  07/04/23 132 lb 6 oz (60 kg)  06/03/23 133 lb (60.3 kg)  05/06/23 134 lb 4 oz (60.9 kg)    BP 114/64   Pulse 83   Ht 4\' 11"  (1.499 m)   Wt 132 lb 6 oz (60 kg)   SpO2 95%   BMI 26.74 kg/m   Assessment and Plan:  Problem List Items Addressed This Visit       Unprioritized   Essential hypertension - Primary (Chronic)   Blood pressure is well controlled.  Current medications metoprolol  25 mg bid.  Dose changed last visit due to low BP. Will continue same regimen along with efforts to limit dietary sodium.       Relevant Medications   metoprolol  tartrate (LOPRESSOR ) 25 MG tablet   CAD (coronary artery disease) (Chronic)   Relevant Medications   metoprolol  tartrate (LOPRESSOR ) 25 MG tablet   Other Visit Diagnoses       Nausea       Relevant Medications   ondansetron  (ZOFRAN ) 4 MG tablet       No follow-ups on file.    Sheron Dixons, MD [facility] [place]

## 2023-07-04 NOTE — Telephone Encounter (Signed)
 Requested Prescriptions  Pending Prescriptions Disp Refills   ACCU-CHEK GUIDE TEST test strip [Pharmacy Med Name: Accu-Chek Guide In Vitro Strip] 300 strip 2    Sig: USE TO CHECK BLOOD SUGAR 3 TIMES DAILY     There is no refill protocol information for this order     metFORMIN  (GLUCOPHAGE ) 1000 MG tablet [Pharmacy Med Name: metFORMIN  HCl 1000 MG Oral Tablet] 100 tablet 0    Sig: TAKE 1 TABLET BY MOUTH DAILY  WITH BREAKFAST     Endocrinology:  Diabetes - Biguanides Passed - 07/04/2023  3:42 PM      Passed - Cr in normal range and within 360 days    Creatinine, Ser  Date Value Ref Range Status  09/03/2022 0.90 0.57 - 1.00 mg/dL Final         Passed - HBA1C is between 0 and 7.9 and within 180 days    Hemoglobin A1C  Date Value Ref Range Status  05/06/2023 7.0 (A) 4.0 - 5.6 % Final  09/20/2022 7.5  Final         Passed - eGFR in normal range and within 360 days    GFR calc Af Amer  Date Value Ref Range Status  08/23/2019 >60 >60 mL/min Final   GFR, Estimated  Date Value Ref Range Status  03/06/2022 >60 >60 mL/min Final    Comment:    (NOTE) Calculated using the CKD-EPI Creatinine Equation (2021)    eGFR  Date Value Ref Range Status  09/03/2022 69 >59 mL/min/1.73 Final         Passed - B12 Level in normal range and within 720 days    Vitamin B-12  Date Value Ref Range Status  11/16/2021 378  Final         Passed - Valid encounter within last 6 months    Recent Outpatient Visits           Today Essential hypertension   [place] [facility] at [facility], Chales [place], MD   1 month ago Vasovagal syncope   [place] [facility] at [facility], Chales [place], MD   1 month ago Type II diabetes mellitus with complication [facility])   [place] [facility] at [facility], Chales [place], MD       Future Appointments             In 2 months Sheron Dixons, MD [facility] Medicine at [facility]            Passed - CBC within normal limits and completed in the last 12 months    WBC  Date Value Ref Range Status  09/03/2022 8.7 3.4 - 10.8 x10E3/uL Final  04/20/2021 14.1 (H) 4.0 - 10.5 K/uL Final   RBC  Date Value Ref Range Status  09/03/2022 4.22 3.77 - 5.28 x10E6/uL Final  04/20/2021 4.22 3.87 - 5.11 MIL/uL Final   Hemoglobin  Date Value Ref Range Status  09/03/2022 11.8 11.1 - 15.9 g/dL Final   Hematocrit  Date Value Ref Range Status  09/03/2022 37.7 34.0 - 46.6 % Final   MCHC  Date Value Ref Range Status  09/03/2022 31.3 (L) 31.5 - 35.7 g/dL Final  29/56/2130 86.5 30.0 - 36.0 g/dL Final   [facility]  Date Value Ref Range Status  09/03/2022 28.0 26.6 - 33.0 pg Final  04/20/2021 28.2 26.0 - 34.0 pg Final  MCV  Date Value Ref Range Status  09/03/2022 89 79 - 97 fL Final   No results found for: "PLTCOUNTKUC", "LABPLAT", "POCPLA" RDW  Date Value Ref Range Status  09/03/2022 13.4 11.7 - 15.4 % Final          empagliflozin  (JARDIANCE ) 25 MG TABS tablet [Pharmacy Med Name: Jardiance  25 MG Oral Tablet] 100 tablet 0    Sig: TAKE 1 TABLET BY MOUTH DAILY  BEFORE BREAKFAST     Endocrinology:  Diabetes - SGLT2 Inhibitors Passed - 07/04/2023  3:42 PM      Passed - Cr in normal range and within 360 days    Creatinine, Ser  Date Value Ref Range Status  09/03/2022 0.90 0.57 - 1.00 mg/dL Final         Passed - HBA1C is between 0 and 7.9 and within 180 days    Hemoglobin A1C  Date Value Ref Range Status  05/06/2023 7.0 (A) 4.0 - 5.6 % Final  09/20/2022 7.5  Final         Passed - eGFR in normal range and within 360 days    GFR calc Af Amer  Date Value Ref Range Status  08/23/2019 >60 >60 mL/min Final   GFR, Estimated  Date Value Ref Range Status  03/06/2022 >60 >60 mL/min Final    Comment:    (NOTE) Calculated using the CKD-EPI Creatinine Equation (2021)    eGFR  Date Value Ref Range Status  09/03/2022 69  >59 mL/min/1.73 Final         Passed - Valid encounter within last 6 months    Recent Outpatient Visits           Today Essential hypertension   [place] [facility] at [facility], Chales [place], MD   1 month ago Vasovagal syncope   [facility] at [facility], Chales [place], MD   1 month ago Type II diabetes mellitus with complication [facility])   [place] [facility] at [facility], Chales [place], MD       Future Appointments             In 2 months Gala Jubilee, [place], MD [facility] Medicine at [facility], [facility]

## 2023-07-07 DIAGNOSIS — E1142 Type 2 diabetes mellitus with diabetic polyneuropathy: Secondary | ICD-10-CM | POA: Diagnosis not present

## 2023-07-07 DIAGNOSIS — E1159 Type 2 diabetes mellitus with other circulatory complications: Secondary | ICD-10-CM | POA: Diagnosis not present

## 2023-07-07 DIAGNOSIS — E785 Hyperlipidemia, unspecified: Secondary | ICD-10-CM | POA: Diagnosis not present

## 2023-07-07 DIAGNOSIS — I152 Hypertension secondary to endocrine disorders: Secondary | ICD-10-CM | POA: Diagnosis not present

## 2023-07-07 DIAGNOSIS — E1169 Type 2 diabetes mellitus with other specified complication: Secondary | ICD-10-CM | POA: Diagnosis not present

## 2023-07-07 DIAGNOSIS — Z794 Long term (current) use of insulin: Secondary | ICD-10-CM | POA: Diagnosis not present

## 2023-07-07 MED FILL — DUPIXENT 300 MG/2 ML SUBCUTANEOUS SYRINGE: SUBCUTANEOUS | 28 days supply | Qty: 4 | Fill #0

## 2023-07-10 DIAGNOSIS — E1142 Type 2 diabetes mellitus with diabetic polyneuropathy: Secondary | ICD-10-CM | POA: Diagnosis not present

## 2023-07-23 ENCOUNTER — Other Ambulatory Visit: Payer: Self-pay | Admitting: Internal Medicine

## 2023-07-25 ENCOUNTER — Encounter: Payer: Self-pay | Admitting: Internal Medicine

## 2023-07-25 ENCOUNTER — Ambulatory Visit (INDEPENDENT_AMBULATORY_CARE_PROVIDER_SITE_OTHER): Admitting: Internal Medicine

## 2023-07-25 ENCOUNTER — Ambulatory Visit: Payer: Self-pay

## 2023-07-25 VITALS — BP 106/70 | HR 91 | Ht 59.0 in | Wt 136.0 lb

## 2023-07-25 DIAGNOSIS — R110 Nausea: Secondary | ICD-10-CM

## 2023-07-25 DIAGNOSIS — R42 Dizziness and giddiness: Secondary | ICD-10-CM

## 2023-07-25 MED ORDER — MECLIZINE HCL 25 MG PO TABS: 25.0000 mg | ORAL_TABLET | Freq: Three times a day (TID) | ORAL | 0 refills | Status: AC | PRN

## 2023-07-25 MED ORDER — ONDANSETRON HCL 4 MG PO TABS
4.0000 mg | ORAL_TABLET | Freq: Three times a day (TID) | ORAL | 0 refills | Status: DC | PRN
Start: 2023-07-25 — End: 2023-11-28

## 2023-07-25 NOTE — Telephone Encounter (Signed)
 Requested Prescriptions  Pending Prescriptions Disp Refills   B-D INS SYR ULTRAFINE .5CC/30G 30G X 1/2" 0.5 ML MISC [Pharmacy Med Name: SYR_INS_UF_0.5ML_30GX1/2"] 100 each 2    Sig: USE WITH LANTUS  INJECTIONS FOR  TYPE 2 DIABETES MELLITUS     There is no refill protocol information for this order

## 2023-07-25 NOTE — Telephone Encounter (Signed)
 Copied from CRM [id number]. Topic: Clinical - Red Word Triage >> Jul 25, 2023  8:58 AM Crispin Dolphin wrote: Red Word that prompted transfer to Nurse Triage: Dizziness last few days, fell this morning going to bathroom  Chief Complaint: dizziness for the last few days, states fell this am, denies hitting head Symptoms: see above Frequency: for the last few days Pertinent Negatives: Patient denies numbness, tingling, fever, Disposition: [] ED /[] Urgent Care (no appt availability in office) / [x] Appointment(In office/virtual)/ []  [place] [facility]/ [] Home Care/ [] Refused Recommended Disposition /[] [facility] [facility]/ []  Follow-up with PCP Additional Notes: apt scheduled for today; care advice given, denies questions; instructed to go to ER if becomes worse.   Reason for Disposition  [1] MODERATE dizziness (e.g., interferes with normal activities) AND [2] has NOT been evaluated by doctor (or NP/PA) for this  (Exception: Dizziness caused by heat exposure, sudden standing, or poor fluid intake.)  Answer Assessment - Initial Assessment Questions 1. DESCRIPTION: "Describe your dizziness."     States got up to go to the bathroom and had to hold on to the walls.  Denies hitting head. 2. LIGHTHEADED: "Do you feel lightheaded?" (e.g., somewhat faint, woozy, weak upon standing)     States can't keep her head up 3. VERTIGO: "Do you feel like either you or the room is spinning or tilting?" (i.e. vertigo)     Spinning  4. SEVERITY: "How bad is it?"  "Do you feel like you are going to faint?" "Can you stand and walk?"   - MILD: Feels slightly dizzy, but walking normally.   - MODERATE: Feels unsteady when walking, but not falling; interferes with normal activities (e.g., school, work).   - SEVERE: Unable to walk without falling, or requires assistance to walk without falling; feels like passing out now.      moderate 5. ONSET:  "When did the dizziness begin?"     Three or four days ago 6. AGGRAVATING  FACTORS: "Does anything make it worse?" (e.g., standing, change in head position)     walking 7. HEART RATE: "Can you tell me your heart rate?" "How many beats in 15 seconds?"  (Note: not all patients can do this)       na 8. CAUSE: "What do you think is causing the dizziness?"     unknown 9. RECURRENT SYMPTOM: "Have you had dizziness before?" If Yes, ask: "When was the last time?" "What happened that time?"     15-20 years ago 10. OTHER SYMPTOMS: "Do you have any other symptoms?" (e.g., fever, chest pain, vomiting, diarrhea, bleeding)       nausea 11. PREGNANCY: "Is there any chance you are pregnant?" "When was your last menstrual period?"       na  Protocols used: Dizziness - Lightheadedness-A-AH

## 2023-07-25 NOTE — Progress Notes (Signed)
 Date:  07/25/2023   Name:  Elizabeth Klein   DOB:  August 19, 1950   MRN:  [id number]   Chief Complaint: Dizziness (Patient presents today for dizziness x 3 days. She states when she wakes up the room is spinning and also when she goes to sleep she has to be careful as well. She has been using a cervical soft collar to keep her head straight because it has been helping with the dizziness. )  Dizziness This is a new problem. Episode onset: three days ago. The problem has been unchanged. Associated symptoms include nausea. Pertinent negatives include no abdominal pain, arthralgias, chest pain, chills, coughing, fatigue, headaches, myalgias or weakness.    Review of Systems  Constitutional:  Negative for chills, fatigue and unexpected weight change.  HENT:  Negative for trouble swallowing.   Eyes:  Negative for visual disturbance.  Respiratory:  Negative for cough, chest tightness, shortness of breath and wheezing.   Cardiovascular:  Negative for chest pain, palpitations and leg swelling.  Gastrointestinal:  Positive for nausea. Negative for abdominal pain, constipation and diarrhea.  Musculoskeletal:  Negative for arthralgias and myalgias.  Neurological:  Positive for dizziness. Negative for weakness, light-headedness and headaches.  Psychiatric/Behavioral:  Negative for dysphoric mood and sleep disturbance. The patient is not nervous/anxious.      Lab Results  Component Value Date   NA 141 09/03/2022   K 5.8 (H) 09/03/2022   CO2 22 09/03/2022   GLUCOSE 165 (H) 09/03/2022   BUN 20 09/03/2022   CREATININE 0.90 09/03/2022   CALCIUM  9.9 09/03/2022   EGFR 69 09/03/2022   GFRNONAA >60 03/06/2022   Lab Results  Component Value Date   CHOL 117 09/03/2022   HDL 47 09/03/2022   LDLCALC 46 09/03/2022   TRIG 139 09/03/2022   CHOLHDL 2.5 09/03/2022   Lab Results  Component Value Date   TSH 1.360 08/30/2021   Lab Results  Component Value Date   HGBA1C 7.0 (A) 05/06/2023   Lab  Results  Component Value Date   WBC 8.7 09/03/2022   HGB 11.8 09/03/2022   HCT 37.7 09/03/2022   MCV 89 09/03/2022   PLT 401 09/03/2022   Lab Results  Component Value Date   ALT 17 09/03/2022   AST 17 09/03/2022   ALKPHOS 107 09/03/2022   BILITOT 0.4 09/03/2022   Lab Results  Component Value Date   VD25OH 38.6 08/30/2021     Patient Active Problem List   Diagnosis Date Noted   Vasovagal syncope 06/03/2023   Migraine without aura and without status migrainosus, not intractable 02/10/2023   Psoriasis 02/10/2023   Impingement syndrome of shoulder, right 07/09/2021   Cervical paraspinal muscle spasm 07/09/2021   Degeneration of lumbar intervertebral disc 04/19/2021   Oropharyngeal dysphagia 02/23/2019   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   CKD (chronic kidney disease) stage 3, GFR 30-59 ml/min (HCC) 04/30/2017   Type II diabetes mellitus with complication (HCC) 03/19/2017   Essential hypertension 03/19/2017   CAD (coronary artery disease) 03/19/2017   History of kidney stones 03/19/2017   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   Hyperlipidemia associated with type 2 diabetes mellitus (HCC) 03/19/2017   Nocturnal leg cramps 03/19/2017   Primary insomnia 03/19/2017   Allergic rhinitis 03/19/2017    Allergies  Allergen Reactions   Prednisone Other (See Comments)    Intolerance  - confusion     Past Surgical History:  Procedure Laterality Date   BREAST BIOPSY Right  neg   CATARACT EXTRACTION W/PHACO Left 09/27/2019   Procedure: CATARACT EXTRACTION PHACO AND INTRAOCULAR LENS PLACEMENT (IOC) LEFT DIABETIC ISTENT INJ INTRAVITREAL KENALOG  INJ;  Surgeon: Rosa College, MD;  [facility];  Service: Ophthalmology;  Laterality: Left;  2.83 0:28.7   CATARACT EXTRACTION W/PHACO Right 12/13/2019   Procedure: CATARACT EXTRACTION PHACO AND INTRAOCULAR LENS PLACEMENT (IOC) RIGHT ISTENT INJ KENALOG  INJ DIABETIC;  Surgeon: Rosa College, MD;   Location: [facility];  Service: Ophthalmology;  Laterality: Right;  1.39 0:21.5   CESAREAN SECTION      Social History   Tobacco Use   Smoking status: Never   Smokeless tobacco: Never  Vaping Use   Vaping status: Never Used  Substance Use Topics   Alcohol use: Never   Drug use: Never     Medication list has been reviewed and updated.  Current Meds  Medication Sig   ACCU-CHEK GUIDE TEST test strip USE TO CHECK BLOOD SUGAR 3 TIMES DAILY   acetaminophen  (TYLENOL ) 500 MG tablet Take 1,000 mg by mouth every 8 (eight) hours as needed.   Ascorbic Acid (VITAMIN C PO) Take by mouth.   aspirin  EC 81 MG tablet Take 1 tablet (81 mg total) by mouth daily.   blood glucose meter kit and supplies Dispense based on patient and insurance preference. Use up to four times daily as directed. (FOR ICD-10 E10.9, E11.9).   clobetasol  (TEMOVATE ) 0.05 % external solution APPLY TOPICALLY TWICE DAILY TO  SCALP LESIONS   clobetasol  ointment (TEMOVATE ) 0.05 % Apply 1 application topically 2 (two) times daily.   DUPIXENT 300 MG/2ML prefilled syringe Inject 300 mg into the skin once.   empagliflozin  (JARDIANCE ) 25 MG TABS tablet TAKE 1 TABLET BY MOUTH DAILY  BEFORE BREAKFAST   gabapentin  (NEURONTIN ) 100 MG capsule TAKE 1 CAPSULE BY MOUTH 3 TIMES  DAILY   glimepiride  (AMARYL ) 2 MG tablet TAKE 1 TABLET BY MOUTH DAILY  WITH BREAKFAST   hydrocortisone  2.5 % ointment Apply topically 2 (two) times daily. To lesion on arm and buttocks   Insulin  Syringe-Needle U-100 (BD INSULIN  SYRINGE U/F) 30G X 1/2" 0.5 ML MISC USE WITH LANTUS  INJECTIONS FOR  TYPE 2 DIABETES MELLITUS   meclizine  (ANTIVERT ) 25 MG tablet Take 1 tablet (25 mg total) by mouth 3 (three) times daily as needed for dizziness.   meloxicam  (MOBIC ) 7.5 MG tablet Take 1 tablet (7.5 mg total) by mouth daily.   metFORMIN  (GLUCOPHAGE ) 1000 MG tablet TAKE 1 TABLET BY MOUTH DAILY  WITH BREAKFAST   metoprolol  tartrate (LOPRESSOR ) 25 MG tablet Take 1 tablet  (25 mg total) by mouth 2 (two) times daily.   montelukast  (SINGULAIR ) 10 MG tablet TAKE 1 TABLET BY MOUTH DAILY   Omega-3 Fatty Acids (FISH OIL) 1000 MG CAPS Take by mouth.   omeprazole  (PRILOSEC) 20 MG capsule TAKE 1 CAPSULE BY MOUTH DAILY   ONETOUCH DELICA PLUS LANCETS MISC 1 each by Does not apply route 2 (two) times daily.   OZEMPIC, 2 MG/DOSE, 8 MG/3ML SOPN Inject 2 mg into the skin once a week.   traZODone  (DESYREL ) 50 MG tablet TAKE 1 TABLET BY MOUTH AT  BEDTIME   vitamin B-12 (CYANOCOBALAMIN) 1000 MCG tablet Take 1,000 mcg by mouth daily.   [DISCONTINUED] ondansetron  (ZOFRAN ) 4 MG tablet Take 1 tablet (4 mg total) by mouth every 8 (eight) hours as needed.       06/03/2023    8:47 AM 05/06/2023    2:27 PM 03/05/2023  1:41 PM 02/10/2023    2:03 PM  GAD 7 : Generalized Anxiety Score  Nervous, Anxious, on Edge 1 1 1  0  Control/stop worrying 1 1 1  0  Worry too much - different things 1 1 1  0  Trouble relaxing 1 1 0 0  Restless 1 1 0 0  Easily annoyed or irritable 2 1 0 0  Afraid - awful might happen 1 1 0 0  Total GAD 7 Score 8 7 3  0  Anxiety Difficulty Somewhat difficult  Not difficult at all Not difficult at all       06/17/2023   11:15 AM 06/03/2023    8:47 AM 05/06/2023    2:27 PM  Depression screen PHQ 2/9  Decreased Interest 0 2 1  Down, Depressed, Hopeless 0 0 1  PHQ - 2 Score 0 2 2  Altered sleeping  1 0  Tired, decreased energy  2 1  Change in appetite  1 1  Feeling bad or failure about yourself   1 1  Trouble concentrating  1 1  Moving slowly or fidgety/restless  2 1  Suicidal thoughts  0 1  PHQ-9 Score  10 8    BP Readings from Last 3 Encounters:  07/25/23 106/70  07/04/23 114/64  06/03/23 104/66    Physical Exam Vitals and nursing note reviewed.  Constitutional:      General: She is not in acute distress.    Appearance: Normal appearance. She is well-developed.  HENT:     Head: Normocephalic and atraumatic.     Right Ear: Tympanic membrane is not  erythematous or retracted.     Left Ear: Tympanic membrane is not erythematous or retracted.     Nose: Nose normal.     Right Sinus: No maxillary sinus tenderness or frontal sinus tenderness.     Left Sinus: No maxillary sinus tenderness or frontal sinus tenderness.     Mouth/Throat:     Mouth: Mucous membranes are moist.  Eyes:     Extraocular Movements: Extraocular movements intact.     Right eye: Nystagmus present.     Left eye: Nystagmus present.  Cardiovascular:     Rate and Rhythm: Normal rate and regular rhythm.     Heart sounds: No murmur heard. Pulmonary:     Effort: Pulmonary effort is normal. No respiratory distress.     Breath sounds: No wheezing or rhonchi.  Skin:    General: Skin is warm and dry.     Findings: No rash.  Neurological:     Mental Status: She is alert and oriented to person, place, and time.  Psychiatric:        Mood and Affect: Mood normal.        Behavior: Behavior normal.     Wt Readings from Last 3 Encounters:  07/25/23 136 lb (61.7 kg)  07/04/23 132 lb 6 oz (60 kg)  06/03/23 133 lb (60.3 kg)    BP 106/70   Pulse 91   Ht 4\' 11"  (1.499 m)   Wt 136 lb (61.7 kg)   SpO2 97%   BMI 27.47 kg/m   Assessment and Plan:  Problem List Items Addressed This Visit   None Visit Diagnoses       Vertigo    -  Primary   should be viral and self limited recommend antivert  and fluids can use Zofran  if needed   Relevant Medications   meclizine  (ANTIVERT ) 25 MG tablet     Nausea  Relevant Medications   ondansetron  (ZOFRAN ) 4 MG tablet       No follow-ups on file.    Sheron Dixons, MD [facility] [place]

## 2023-07-28 DIAGNOSIS — L281 Prurigo nodularis: Principal | ICD-10-CM

## 2023-07-28 DIAGNOSIS — L259 Unspecified contact dermatitis, unspecified cause: Principal | ICD-10-CM

## 2023-07-28 MED ORDER — CLOBETASOL 0.05 % TOPICAL OINTMENT
TOPICAL | 3 refills | 0.00000 days | Status: CP
Start: 2023-07-28 — End: ?
  Filled 2023-08-01: qty 50, 30d supply, fill #0

## 2023-07-28 MED ORDER — CLOBETASOL 0.05 % SCALP SOLUTION
Freq: Two times a day (BID) | TOPICAL | 0 refills | 0.00000 days | Status: CP
Start: 2023-07-28 — End: 2024-07-27

## 2023-07-28 NOTE — Unmapped (Signed)
 Monique Coleman has seen some small improvements in her skin and she's tolerating the syringe much better than the pen. She is having some dizziness - she'll continue to monitor.     She requested refills of topicals since she was told by her outside pharmacy that her insurance wouldn't cover. Will see if alternatives are preferred.     [facility] Clinical Assessment & Refill Coordination Note    Monique Coleman, DOB: Feb 02, 1951  Phone: There are no phone numbers on file.    All above HIPAA information was verified with patient.     Was a Nurse, learning disability used for this call? No    Specialty Medication(s):   Inflammatory Disorders: Dupixent     Current Outpatient Medications   Medication Sig Dispense Refill    ACCU-CHEK GUIDE TEST STRIPS Strp       aspirin (ECOTRIN) 81 MG tablet Take 1 tablet (81 mg total) by mouth daily.      atorvastatin (LIPITOR) 80 MG tablet Take 1 tablet (80 mg total) by mouth daily.      blood sugar diagnostic Strp USE 3 TIMES DAILY      clobetasol (TEMOVATE) 0.05 % external solution Apply topically Two (2) times a day. To rash on the scalp. 50 mL 0    clobetasol (TEMOVATE) 0.05 % ointment APPLY UP TO TWICE DAILY AS  NEEDED TO ITCHY AREAS TOPICALLY  OR SCALP 240 g 0    clobetasol (TEMOVATE) 0.05 % ointment Apply up to two times per day as needed to itchy areas on skin or scalp 60 g 3    dulaglutide (TRULICITY) 0.75 mg/0.5 mL injection pen Inject 0.5 mL (0.75 mg total) under the skin once a week.      dupilumab (DUPIXENT SYRINGE) 300 mg/2 mL syringe Inject the contents of 1 syringe (300 mg total) under the skin every fourteen (14) days. 8 mL 11    dupilumab (DUPIXENT) 300 mg/2 mL syringe Inject the contents of 2 pens (600mg ) under the skin for loading dose 4 mL 0    dupilumab 300 mg/2 mL PnIj Inject the contents of 1 pen (300 mg) under the skin every fourteen (14) days as maintenance. (Patient not taking: Reported on 12/10/2022) 4 mL 10    empty container Misc Use as directed to dispose of Dupixent 1 each 2    empty container Misc Use as directed to dispose of Dupixent pens. 1 each 2    gabapentin (NEURONTIN) 100 MG capsule       glimepiride (AMARYL) 2 MG tablet Take 1 tablet (2 mg total) by mouth daily before breakfast.      insulin glargine (LANTUS) 100 unit/mL injection Inject 0.4 mL (40 Units total) under the skin nightly.      irbesartan (AVAPRO) 300 MG tablet Take 1 tablet (300 mg total) by mouth nightly. (Patient not taking: Reported on 12/10/2022)      losartan (COZAAR) 50 MG tablet Take 1 tablet (50 mg total) by mouth daily. (Patient not taking: Reported on 12/10/2022)      meloxicam (MOBIC) 7.5 MG tablet Take 1 tablet (7.5 mg total) by mouth daily. (Patient not taking: Reported on 12/10/2022)      metFORMIN (GLUCOPHAGE) 1000 MG tablet Take 1 tablet (1,000 mg total) by mouth in the morning and 1 tablet (1,000 mg total) in the evening. Take with meals.      metoprolol tartrate (LOPRESSOR) 50 MG tablet Take 1 tablet (50 mg total) by mouth two (2) times  a day.      montelukast (SINGULAIR) 10 mg tablet Take 1 tablet (10 mg total) by mouth nightly.      mupirocin (BACTROBAN) 2 % ointment Apply topically Three (3) times a day. To affected area BID  till healed (Patient not taking: Reported on 12/10/2022) 15 g 1    naltrexone 1.5 mg cap Take 3 mg by mouth in the morning. (Patient not taking: Reported on 12/10/2022) 30 capsule 11    omeprazole (PRILOSEC) 20 MG capsule Take 1 capsule (20 mg total) by mouth daily.      OZEMPIC 2 mg/dose (8 mg/3 mL) PnIj       traZODone (DESYREL) 50 MG tablet Take 1 tablet (50 mg total) by mouth nightly.       No current facility-administered medications for this visit.        Changes to medications: Monique Coleman reports no changes at this time.    Medication list has been reviewed and updated in Epic: Yes    No Known Allergies    Changes to allergies: No    Allergies have been reviewed and updated in Epic: Yes    SPECIALTY MEDICATION ADHERENCE     Dupixent - 0 left  Medication Adherence    Patient reported X missed doses in the last month: 0  Specialty Medication: Dupixent          Specialty medication(s) dose(s) confirmed: Regimen is correct and unchanged.     Are there any concerns with adherence? No    Adherence counseling provided? Not needed    CLINICAL MANAGEMENT AND INTERVENTION      Clinical Benefit Assessment:    Do you feel the medicine is effective or helping your condition? Yes    Clinical Benefit counseling provided? Not needed    Adverse Effects Assessment:    Are you experiencing any side effects? No    Are you experiencing difficulty administering your medicine? No    Quality of Life Assessment:    Quality of Life    Rheumatology  Oncology  Dermatology  1. What impact has your specialty medication had on the symptoms of your skin condition (i.e. itchiness, soreness, stinging)?: Some  2. What impact has your specialty medication had on your comfort level with your skin?: Some  Cystic Fibrosis          How many days over the past month did your PN  keep you from your normal activities? For example, brushing your teeth or getting up in the morning. Patient declined to answer    Have you discussed this with your provider? Not needed    Acute Infection Status:    Acute infections noted within Epic:  No active infections    Patient reported infection: None    Therapy Appropriateness:    Is therapy appropriate based on current medication list, adverse reactions, adherence, clinical benefit and progress toward achieving therapeutic goals? Yes, therapy is appropriate and should be continued     Clinical Intervention:    Was an intervention completed as part of this clinical assessment? No    DISEASE/MEDICATION-SPECIFIC INFORMATION      For patients on injectable medications: Patient currently has 0 doses left.  Next injection is scheduled for 6/13.    Chronic Inflammatory Diseases: Have you experienced any flares in the last month? No  Has this been reported to your provider? No    PATIENT SPECIFIC NEEDS     Does the patient have any physical, cognitive, or cultural barriers?  No    Is the patient high risk? No    Does the patient require physician intervention or other additional services (i.e., nutrition, smoking cessation, social work)? No    Does the patient have an additional or emergency contact listed in their chart? Yes    SOCIAL DETERMINANTS OF HEALTH     At the [facility], we have learned that life circumstances - like trouble affording food, housing, utilities, or transportation can affect the health of many of our patients.   That is why we wanted to ask: are you currently experiencing any life circumstances that are negatively impacting your health and/or quality of life? Patient declined to answer    Social Drivers of Health     Food Insecurity: No Food Insecurity (05/01/2023)    Received from [facility]    Hunger Vital Sign     Worried About Running Out of Food in the Last Year: Never true     Ran Out of Food in the Last Year: Never true   Tobacco Use: Low Risk  (06/03/2023)    Received from [facility]    Patient History     Smoking Tobacco Use: Never     Smokeless Tobacco Use: Never     Passive Exposure: Not on file   Transportation Needs: No Transportation Needs (05/01/2023)    Received from [facility] - Transportation     Lack of Transportation (Medical): No     Lack of Transportation (Non-Medical): No   Alcohol Use: Not on file   Housing: Not on file   Physical Activity: Inactive (05/01/2023)    Received from [facility]    Exercise Vital Sign     Days of Exercise per Week: 0 days     Minutes of Exercise per Session: 0 min   Utilities: Not At Risk (05/01/2023)    Received from [facility] Utilities     Threatened with loss of utilities: No   Stress: No Stress Concern Present (05/01/2023)    Received from [facility] - Occupational Stress Questionnaire     Feeling of Stress : Only a little Interpersonal Safety: Not on file   Substance Use: Not on file (12/31/2022)   Intimate Partner Violence: Not At Risk (05/01/2023)    Received from [facility]    Humiliation, Afraid, Rape, and Kick questionnaire     Fear of Current or Ex-Partner: No     Emotionally Abused: No     Physically Abused: No     Sexually Abused: No   Social Connections: Moderately Integrated (05/01/2023)    Received from [facility]    Social Connection and Isolation Panel [NHANES]     Frequency of Communication with Friends and Family: More than three times a week     Frequency of Social Gatherings with Friends and Family: Twice a week     Attends Religious Services: More than 4 times per year     Active Member of [employer] or Organizations: No     Attends Banker Meetings: Never     Marital Status: Married   Programmer, applications: Low Risk  (05/01/2023)    Received from [facility]    Overall Financial Resource Strain (CARDIA)     Difficulty of Paying Living Expenses: Not hard at all   Health Literacy: Adequate Health Literacy (05/01/2023)    Received from [facility]  B1300 Health Literacy     Frequency of need for help with medical instructions: Never   Internet Connectivity: Not on file       Would you be willing to receive help with any of the needs that you have identified today? Not applicable       SHIPPING     Specialty Medication(s) to be Shipped:   Inflammatory Disorders: Dupixent    Other medication(s) to be shipped: clobetasol ointment and topical solution (refills requested)     Changes to insurance: No    Cost and Payment: Patient has a $0 copay, payment information is not required.    Delivery Scheduled: Yes, Expected medication delivery date: 6/6.     Medication will be delivered via Same Day Courier to the confirmed prescription address in [facility].    The patient will receive a drug information handout for each medication shipped and additional FDA Medication Guides as required.  Verified that patient has previously received a Conservation officer, historic buildings and a Surveyor, mining.    The patient or caregiver noted above participated in the development of this care plan and knows that they can request review of or adjustments to the care plan at any time.      All of the patient's questions and concerns have been addressed.    Sant[facility] Kinsella [facility] Specialty Pharmacist

## 2023-08-01 MED FILL — CLOBETASOL 0.05 % TOPICAL OINTMENT: TOPICAL | 30 days supply | Qty: 60 | Fill #0

## 2023-08-01 MED FILL — DUPIXENT 300 MG/2 ML SUBCUTANEOUS SYRINGE: SUBCUTANEOUS | 28 days supply | Qty: 4 | Fill #1

## 2023-08-13 ENCOUNTER — Ambulatory Visit: Admitting: Internal Medicine

## 2023-08-13 DIAGNOSIS — H04123 Dry eye syndrome of bilateral lacrimal glands: Secondary | ICD-10-CM | POA: Diagnosis not present

## 2023-08-13 DIAGNOSIS — H1033 Unspecified acute conjunctivitis, bilateral: Secondary | ICD-10-CM | POA: Diagnosis not present

## 2023-08-13 DIAGNOSIS — H0289 Other specified disorders of eyelid: Secondary | ICD-10-CM | POA: Diagnosis not present

## 2023-08-13 DIAGNOSIS — Z961 Presence of intraocular lens: Secondary | ICD-10-CM | POA: Diagnosis not present

## 2023-08-26 NOTE — Unmapped (Signed)
 [facility] Refill Coordination Note    Specialty Medication(s) to be Shipped:   Inflammatory Disorders: Dupixent     Other medication(s) to be shipped: No additional medications requested for fill at this time     Teya Ketcham, DOB: May 25, 1950  Phone: There are no phone numbers on file.      All above HIPAA information was verified with patient.     Was a Nurse, learning disability used for this call? No    Completed refill call assessment today to schedule patient's medication shipment from the [facility]  [phone]).  All relevant notes have been reviewed.     Specialty medication(s) and dose(s) confirmed: Regimen is correct and unchanged.   Changes to medications: Cashlynn reports no changes at this time.  Changes to insurance: No  New side effects reported not previously addressed with a pharmacist or physician: None reported  Questions for the pharmacist: No    Confirmed patient received a Conservation officer, historic buildings and a Surveyor, mining with first shipment. The patient will receive a drug information handout for each medication shipped and additional FDA Medication Guides as required.       DISEASE/MEDICATION-SPECIFIC INFORMATION        For patients on injectable medications: Patient currently has 0 doses left.  Next injection is scheduled for 08/22/2023.    SPECIALTY MEDICATION ADHERENCE     Medication Adherence    Patient reported X missed doses in the last month: 0  Specialty Medication: dupilumab  (DUPIXENT  SYRINGE) 300 mg/2 mL syringe  Patient is on additional specialty medications: No              Were doses missed due to medication being on hold? No    dupilumab : DUPIXENT  SYRINGE 300 mg/2 mL syringe 0 days of medicine on hand     REFERRAL TO PHARMACIST     Referral to the pharmacist: Not needed      [facility]     Shipping address confirmed in Epic.     Cost and Payment: Patient has a $0 copay, payment information is not required.    Delivery Scheduled: Yes, Expected medication delivery date: 08/28/2023.     Medication will be delivered via Same Day Courier to the prescription address in Epic [facility].    Nelida Winfred [facility]  Specialty Technician

## 2023-08-28 MED FILL — DUPIXENT 300 MG/2 ML SUBCUTANEOUS SYRINGE: SUBCUTANEOUS | 28 days supply | Qty: 4 | Fill #2

## 2023-09-05 ENCOUNTER — Encounter: Payer: Self-pay | Admitting: Internal Medicine

## 2023-09-05 ENCOUNTER — Ambulatory Visit (INDEPENDENT_AMBULATORY_CARE_PROVIDER_SITE_OTHER): Admitting: Internal Medicine

## 2023-09-05 VITALS — BP 124/74 | HR 67 | Ht 59.0 in | Wt 134.0 lb

## 2023-09-05 DIAGNOSIS — E118 Type 2 diabetes mellitus with unspecified complications: Secondary | ICD-10-CM | POA: Diagnosis not present

## 2023-09-05 DIAGNOSIS — Z13820 Encounter for screening for osteoporosis: Secondary | ICD-10-CM | POA: Diagnosis not present

## 2023-09-05 DIAGNOSIS — E785 Hyperlipidemia, unspecified: Secondary | ICD-10-CM | POA: Diagnosis not present

## 2023-09-05 DIAGNOSIS — Z7984 Long term (current) use of oral hypoglycemic drugs: Secondary | ICD-10-CM | POA: Diagnosis not present

## 2023-09-05 DIAGNOSIS — Z1231 Encounter for screening mammogram for malignant neoplasm of breast: Secondary | ICD-10-CM

## 2023-09-05 DIAGNOSIS — Z0000 Encounter for general adult medical examination without abnormal findings: Secondary | ICD-10-CM

## 2023-09-05 DIAGNOSIS — N1831 Chronic kidney disease, stage 3a: Secondary | ICD-10-CM | POA: Diagnosis not present

## 2023-09-05 DIAGNOSIS — E1169 Type 2 diabetes mellitus with other specified complication: Secondary | ICD-10-CM | POA: Diagnosis not present

## 2023-09-05 DIAGNOSIS — I10 Essential (primary) hypertension: Secondary | ICD-10-CM | POA: Diagnosis not present

## 2023-09-05 MED ORDER — METFORMIN HCL 1000 MG PO TABS: 1000.0000 mg | ORAL_TABLET | Freq: Every day | ORAL | 1 refills | Status: AC

## 2023-09-05 MED ORDER — EMPAGLIFLOZIN 25 MG PO TABS: 25.0000 mg | ORAL_TABLET | Freq: Every day | ORAL | 1 refills | Status: AC

## 2023-09-05 MED ORDER — ATORVASTATIN CALCIUM 80 MG PO TABS
80.0000 mg | ORAL_TABLET | Freq: Every day | ORAL | 3 refills | Status: AC
Start: 2023-09-05 — End: ?

## 2023-09-05 NOTE — Assessment & Plan Note (Signed)
 LDL is  Lab Results  Component Value Date   LDLCALC 46 09/03/2022   Current regimen is atorvastatin .  No medication side effects noted. Goal LDL is <55.

## 2023-09-05 NOTE — Patient Instructions (Addendum)
 Call [facility] to schedule your mammogram and Bone Density test at [phone].

## 2023-09-05 NOTE — Assessment & Plan Note (Addendum)
 Blood sugars have been stable.  No recent hypoglycemic events requiring assistance. Currently medications are MTF, Jardiance , glimepiride , Ozempic and Insulin . Lab Results  Component Value Date   HGBA1C 7.0 (A) 05/06/2023   Last visit no changes were made. She continues to see Endo regularly.  She is due for urine microalbumin.

## 2023-09-05 NOTE — Assessment & Plan Note (Signed)
 Monitoring regularly GFR has been stable - last visit was normal range.

## 2023-09-05 NOTE — Progress Notes (Signed)
 Date:  09/05/2023   Name:  Elizabeth Klein   DOB:  1950/07/10   MRN:  [id number]   Chief Complaint: Annual Exam Elizabeth Klein is a 73 y.o. female who presents today for her Complete Annual Exam. She feels well. She reports exercising some. She reports she is sleeping fairly well. Breast complaints - none.  Health Maintenance  Topic Date Due   COVID-19 Vaccine (8 - Pfizer risk 2024-25 season) 05/23/2023   Yearly kidney function blood test for diabetes  09/03/2023   Yearly kidney health urinalysis for diabetes  09/03/2023   DTaP/Tdap/Td vaccine (1 - Tdap) 01/06/2024*   Flu Shot  09/26/2023   DEXA scan (bone density measurement)  11/01/2023   Mammogram  11/04/2023   Hemoglobin A1C  11/06/2023   Eye exam for diabetics  12/04/2023   Medicare Annual Wellness Visit  04/30/2024   Complete foot exam   09/04/2024   Colon Cancer Screening  09/25/2025   Pneumococcal Vaccine for age over 7  Completed   Hepatitis C Screening  Completed   Zoster (Shingles) Vaccine  Completed   Hepatitis B Vaccine  Aged Out   HPV Vaccine  Aged Out   Meningitis B Vaccine  Aged Out  *Topic was postponed. The date shown is not the original due date.    Hypertension Pertinent negatives include no chest pain, headaches, palpitations or shortness of breath.  Hyperlipidemia Pertinent negatives include no chest pain, myalgias or shortness of breath.  Diabetes She presents for her follow-up diabetic visit. She has type 2 diabetes mellitus. Pertinent negatives for hypoglycemia include no dizziness or headaches. Pertinent negatives for diabetes include no chest pain, no fatigue and no weakness.    Review of Systems  Constitutional:  Negative for fatigue and unexpected weight change.  HENT:  Negative for trouble swallowing.   Eyes:  Negative for visual disturbance.  Respiratory:  Negative for cough, chest tightness, shortness of breath and wheezing.   Cardiovascular:  Negative for chest pain, palpitations  and leg swelling.  Gastrointestinal:  Negative for abdominal pain, constipation and diarrhea.  Musculoskeletal:  Negative for arthralgias and myalgias.  Neurological:  Negative for dizziness, weakness, light-headedness and headaches.     Lab Results  Component Value Date   NA 141 09/03/2022   K 5.8 (H) 09/03/2022   CO2 22 09/03/2022   GLUCOSE 165 (H) 09/03/2022   BUN 20 09/03/2022   CREATININE 0.90 09/03/2022   CALCIUM  9.9 09/03/2022   EGFR 69 09/03/2022   GFRNONAA >60 03/06/2022   Lab Results  Component Value Date   CHOL 117 09/03/2022   HDL 47 09/03/2022   LDLCALC 46 09/03/2022   TRIG 139 09/03/2022   CHOLHDL 2.5 09/03/2022   Lab Results  Component Value Date   TSH 1.360 08/30/2021   Lab Results  Component Value Date   HGBA1C 7.0 (A) 05/06/2023   Lab Results  Component Value Date   WBC 8.7 09/03/2022   HGB 11.8 09/03/2022   HCT 37.7 09/03/2022   MCV 89 09/03/2022   PLT 401 09/03/2022   Lab Results  Component Value Date   ALT 17 09/03/2022   AST 17 09/03/2022   ALKPHOS 107 09/03/2022   BILITOT 0.4 09/03/2022   Lab Results  Component Value Date   VD25OH 38.6 08/30/2021     Patient Active Problem List   Diagnosis Date Noted   Vasovagal syncope 06/03/2023   Migraine without aura and without status migrainosus, not intractable 02/10/2023   Psoriasis 02/10/2023  Impingement syndrome of shoulder, right 07/09/2021   Cervical paraspinal muscle spasm 07/09/2021   Degeneration of lumbar intervertebral disc 04/19/2021   Oropharyngeal dysphagia 02/23/2019   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   CKD (chronic kidney disease) stage 3, GFR 30-59 ml/min (HCC) 04/30/2017   Type II diabetes mellitus with complication (HCC) 03/19/2017   Essential hypertension 03/19/2017   CAD (coronary artery disease) 03/19/2017   History of kidney stones 03/19/2017   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   Hyperlipidemia associated with type 2 diabetes mellitus  (HCC) 03/19/2017   Nocturnal leg cramps 03/19/2017   Primary insomnia 03/19/2017   Allergic rhinitis 03/19/2017    Allergies  Allergen Reactions   Prednisone Other (See Comments)    Intolerance  - confusion     Past Surgical History:  Procedure Laterality Date   BREAST BIOPSY Right    neg   CATARACT EXTRACTION W/PHACO Left 09/27/2019   Procedure: CATARACT EXTRACTION PHACO AND INTRAOCULAR LENS PLACEMENT (IOC) LEFT DIABETIC ISTENT INJ INTRAVITREAL KENALOG  INJ;  Surgeon: Myrna Adine Anes, MD;  Location: [facility];  Service: Ophthalmology;  Laterality: Left;  2.83 0:28.7   CATARACT EXTRACTION W/PHACO Right 12/13/2019   Procedure: CATARACT EXTRACTION PHACO AND INTRAOCULAR LENS PLACEMENT (IOC) RIGHT ISTENT INJ KENALOG  INJ DIABETIC;  Surgeon: Myrna Adine Anes, MD;  Location: [facility];  Service: Ophthalmology;  Laterality: Right;  1.39 0:21.5   CESAREAN SECTION      Social History   Tobacco Use   Smoking status: Never   Smokeless tobacco: Never  Vaping Use   Vaping status: Never Used  Substance Use Topics   Alcohol use: Never   Drug use: Never     Medication list has been reviewed and updated.  Current Meds  Medication Sig   ACCU-CHEK GUIDE TEST test strip USE TO CHECK BLOOD SUGAR 3 TIMES DAILY   acetaminophen  (TYLENOL ) 500 MG tablet Take 1,000 mg by mouth every 8 (eight) hours as needed.   Ascorbic Acid (VITAMIN C PO) Take by mouth.   aspirin  EC 81 MG tablet Take 1 tablet (81 mg total) by mouth daily.   B-D INS SYR ULTRAFINE .5CC/30G 30G X 1/2 0.5 ML MISC USE WITH LANTUS  INJECTIONS FOR  TYPE 2 DIABETES MELLITUS   blood glucose meter kit and supplies Dispense based on patient and insurance preference. Use up to four times daily as directed. (FOR ICD-10 E10.9, E11.9).   clobetasol  (TEMOVATE ) 0.05 % external solution APPLY TOPICALLY TWICE DAILY TO  SCALP LESIONS   clobetasol  ointment (TEMOVATE ) 0.05 % Apply 1 application topically 2 (two) times daily.    DUPIXENT 300 MG/2ML prefilled syringe Inject 300 mg into the skin once.   gabapentin  (NEURONTIN ) 100 MG capsule TAKE 1 CAPSULE BY MOUTH 3 TIMES  DAILY   glimepiride  (AMARYL ) 2 MG tablet TAKE 1 TABLET BY MOUTH DAILY  WITH BREAKFAST   hydrocortisone  2.5 % ointment Apply topically 2 (two) times daily. To lesion on arm and buttocks   insulin  glargine (LANTUS ) 100 UNIT/ML injection Inject 0.4 mLs (40 Units total) into the skin every morning. Pt taking QAM   meclizine  (ANTIVERT ) 25 MG tablet Take 1 tablet (25 mg total) by mouth 3 (three) times daily as needed for dizziness.   meloxicam  (MOBIC ) 7.5 MG tablet Take 1 tablet (7.5 mg total) by mouth daily.   metoprolol  tartrate (LOPRESSOR ) 25 MG tablet Take 1 tablet (25 mg total) by mouth 2 (two) times daily.   montelukast  (SINGULAIR ) 10 MG tablet TAKE  1 TABLET BY MOUTH DAILY   Omega-3 Fatty Acids (FISH OIL) 1000 MG CAPS Take by mouth.   omeprazole  (PRILOSEC) 20 MG capsule TAKE 1 CAPSULE BY MOUTH DAILY   ondansetron  (ZOFRAN ) 4 MG tablet Take 1 tablet (4 mg total) by mouth every 8 (eight) hours as needed.   ONETOUCH DELICA PLUS LANCETS MISC 1 each by Does not apply route 2 (two) times daily.   OZEMPIC, 2 MG/DOSE, 8 MG/3ML SOPN Inject 2 mg into the skin once a week.   traZODone  (DESYREL ) 50 MG tablet TAKE 1 TABLET BY MOUTH AT  BEDTIME   vitamin B-12 (CYANOCOBALAMIN) 1000 MCG tablet Take 1,000 mcg by mouth daily.   [DISCONTINUED] atorvastatin  (LIPITOR) 80 MG tablet Take 1 tablet (80 mg total) by mouth daily.   [DISCONTINUED] empagliflozin  (JARDIANCE ) 25 MG TABS tablet TAKE 1 TABLET BY MOUTH DAILY  BEFORE BREAKFAST   [DISCONTINUED] metFORMIN  (GLUCOPHAGE ) 1000 MG tablet TAKE 1 TABLET BY MOUTH DAILY  WITH BREAKFAST       09/05/2023   10:04 AM 06/03/2023    8:47 AM 05/06/2023    2:27 PM 03/05/2023    1:41 PM  GAD 7 : Generalized Anxiety Score  Nervous, Anxious, on Edge 0 1 1 1   Control/stop worrying 0 1 1 1   Worry too much - different things 0 1 1 1    Trouble relaxing 0 1 1 0  Restless 0 1 1 0  Easily annoyed or irritable 0 2 1 0  Afraid - awful might happen 0 1 1 0  Total GAD 7 Score 0 8 7 3   Anxiety Difficulty Not difficult at all Somewhat difficult  Not difficult at all       09/05/2023   10:04 AM 06/17/2023   11:15 AM 06/03/2023    8:47 AM  Depression screen PHQ 2/9  Decreased Interest 0 0 2  Down, Depressed, Hopeless 0 0 0  PHQ - 2 Score 0 0 2  Altered sleeping 0  1  Tired, decreased energy 0  2  Change in appetite 0  1  Feeling bad or failure about yourself  0  1  Trouble concentrating 0  1  Moving slowly or fidgety/restless 0  2  Suicidal thoughts 0  0  PHQ-9 Score 0  10  Difficult doing work/chores Not difficult at all      BP Readings from Last 3 Encounters:  09/05/23 124/74  07/25/23 106/70  07/04/23 114/64    Physical Exam Vitals and nursing note reviewed.  Constitutional:      General: She is not in acute distress.    Appearance: She is well-developed.  HENT:     Head: Normocephalic and atraumatic.     Right Ear: Tympanic membrane and ear canal normal.     Left Ear: Tympanic membrane and ear canal normal.     Nose:     Right Sinus: No maxillary sinus tenderness.     Left Sinus: No maxillary sinus tenderness.  Eyes:     General: No scleral icterus.       Right eye: No discharge.        Left eye: No discharge.     Conjunctiva/sclera: Conjunctivae normal.  Neck:     Thyroid : No thyromegaly.     Vascular: No carotid bruit.  Cardiovascular:     Rate and Rhythm: Normal rate and regular rhythm.     Pulses: Normal pulses.     Heart sounds: Normal heart sounds.  Pulmonary:  Effort: Pulmonary effort is normal. No respiratory distress.     Breath sounds: No wheezing.  Abdominal:     General: Bowel sounds are normal.     Palpations: Abdomen is soft.     Tenderness: There is no abdominal tenderness.  Musculoskeletal:     Cervical back: Normal range of motion. No erythema.     Right lower leg: No  edema.     Left lower leg: No edema.  Lymphadenopathy:     Cervical: No cervical adenopathy.  Skin:    General: Skin is warm and dry.     Findings: No rash.  Neurological:     Mental Status: She is alert and oriented to person, place, and time.     Cranial Nerves: No cranial nerve deficit.     Sensory: No sensory deficit.     Deep Tendon Reflexes: Reflexes are normal and symmetric.  Psychiatric:        Attention and Perception: Attention normal.        Mood and Affect: Mood normal.     Wt Readings from Last 3 Encounters:  09/05/23 134 lb (60.8 kg)  07/25/23 136 lb (61.7 kg)  07/04/23 132 lb 6 oz (60 kg)    BP 124/74   Pulse 67   Ht 4' 11 (1.499 m)   Wt 134 lb (60.8 kg)   SpO2 97%   BMI 27.06 kg/m   Assessment and Plan:  Problem List Items Addressed This Visit       Unprioritized   Type II diabetes mellitus with complication (HCC) (Chronic)   Blood sugars have been stable.  No recent hypoglycemic events requiring assistance. Currently medications are MTF, Jardiance , glimepiride , Ozempic and Insulin . Lab Results  Component Value Date   HGBA1C 7.0 (A) 05/06/2023   Last visit no changes were made. She continues to see Endo regularly.  She is due for urine microalbumin.       Relevant Medications   atorvastatin  (LIPITOR) 80 MG tablet   empagliflozin  (JARDIANCE ) 25 MG TABS tablet   metFORMIN  (GLUCOPHAGE ) 1000 MG tablet   Other Relevant Orders   Comprehensive metabolic panel with GFR   Hemoglobin A1c   Microalbumin / creatinine urine ratio   Essential hypertension (Chronic)   Relevant Medications   atorvastatin  (LIPITOR) 80 MG tablet   Other Relevant Orders   CBC with Differential/Platelet   TSH   Hyperlipidemia associated with type 2 diabetes mellitus (HCC) (Chronic)   LDL is  Lab Results  Component Value Date   LDLCALC 46 09/03/2022   Current regimen is atorvastatin .  No medication side effects noted. Goal LDL is <55.       Relevant Medications    atorvastatin  (LIPITOR) 80 MG tablet   empagliflozin  (JARDIANCE ) 25 MG TABS tablet   metFORMIN  (GLUCOPHAGE ) 1000 MG tablet   Other Relevant Orders   Lipid panel   CKD (chronic kidney disease) stage 3, GFR 30-59 ml/min (HCC) (Chronic)   Monitoring regularly GFR has been stable - last visit was normal range.      Relevant Orders   Comprehensive metabolic panel with GFR   Other Visit Diagnoses       Annual physical exam    -  Primary   up to date on immunizations needs to schedule Mammo and DEXA     Encounter for screening mammogram for breast cancer       she will schedule at [facility]     Encounter for screening for osteoporosis  she will schedule at [facility]     Long term current use of oral hypoglycemic drug           Return in about 4 months (around 01/06/2024) for HTN, DM.    Leita HILARIO Adie, MD [facility] [place]

## 2023-09-09 DIAGNOSIS — L259 Unspecified contact dermatitis, unspecified cause: Principal | ICD-10-CM

## 2023-09-09 DIAGNOSIS — L281 Prurigo nodularis: Principal | ICD-10-CM

## 2023-09-09 MED ORDER — CLOBETASOL 0.05 % SCALP SOLUTION
Freq: Two times a day (BID) | TOPICAL | 3 refills | 0.00000 days | Status: CP
Start: 2023-09-09 — End: 2024-09-08

## 2023-09-09 NOTE — Unmapped (Addendum)
-   Continue the Dupixent  injection every 14 days and using the clobetasol  ointment as needed  - Try not to scratch the spots as much as you can  - It was nice seeing you as always!    Meet your team:     Your nurse is: Brad    Please remember to fill out the survey you will receive after your visit. Your comments help us  continue to improve our care.      Thanks in advance!      [facility] Clinical Staff

## 2023-09-11 DIAGNOSIS — I10 Essential (primary) hypertension: Secondary | ICD-10-CM | POA: Diagnosis not present

## 2023-09-11 DIAGNOSIS — E1169 Type 2 diabetes mellitus with other specified complication: Secondary | ICD-10-CM | POA: Diagnosis not present

## 2023-09-11 DIAGNOSIS — N1831 Chronic kidney disease, stage 3a: Secondary | ICD-10-CM | POA: Diagnosis not present

## 2023-09-11 DIAGNOSIS — E118 Type 2 diabetes mellitus with unspecified complications: Secondary | ICD-10-CM | POA: Diagnosis not present

## 2023-09-11 DIAGNOSIS — E785 Hyperlipidemia, unspecified: Secondary | ICD-10-CM | POA: Diagnosis not present

## 2023-09-12 LAB — LIPID PANEL
Chol/HDL Ratio: 3.6 ratio (ref 0.0–4.4)
Cholesterol, Total: 160 mg/dL (ref 100–199)
HDL: 44 mg/dL (ref >39)
LDL Chol Calc (NIH): 81 mg/dL (ref 0–99)
Triglycerides: 210 mg/dL — ABNORMAL HIGH (ref 0–149)
VLDL Cholesterol Cal: 35 mg/dL (ref 5–40)

## 2023-09-12 LAB — COMPREHENSIVE METABOLIC PANEL WITH GFR
ALT: 20 IU/L (ref 0–32)
AST: 17 IU/L (ref 0–40)
Albumin: 4.2 g/dL (ref 3.8–4.8)
Alkaline Phosphatase: 101 IU/L (ref 44–121)
BUN/Creatinine Ratio: 17 (ref 12–28)
BUN: 18 mg/dL (ref 8–27)
Bilirubin Total: 0.6 mg/dL (ref 0.0–1.2)
CO2: 20 mmol/L (ref 20–29)
Calcium: 9.9 mg/dL (ref 8.7–10.3)
Chloride: 102 mmol/L (ref 96–106)
Creatinine, Ser: 1.06 mg/dL — ABNORMAL HIGH (ref 0.57–1.00)
Globulin, Total: 2.8 g/dL (ref 1.5–4.5)
Glucose: 146 mg/dL — ABNORMAL HIGH (ref 70–99)
Potassium: 5 mmol/L (ref 3.5–5.2)
Sodium: 139 mmol/L (ref 134–144)
Total Protein: 7 g/dL (ref 6.0–8.5)
eGFR: 56 mL/min/1.73 — ABNORMAL LOW (ref >59)

## 2023-09-12 LAB — CBC WITH DIFFERENTIAL/PLATELET
Basophils Absolute: 0.1 x10E3/uL (ref 0.0–0.2)
Basos: 1 %
EOS (ABSOLUTE): 0.6 x10E3/uL — ABNORMAL HIGH (ref 0.0–0.4)
Eos: 7 %
Hematocrit: 43.5 % (ref 34.0–46.6)
Hemoglobin: 13.4 g/dL (ref 11.1–15.9)
Immature Grans (Abs): 0 x10E3/uL (ref 0.0–0.1)
Immature Granulocytes: 0 %
Lymphocytes Absolute: 2.4 x10E3/uL (ref 0.7–3.1)
Lymphs: 29 %
MCH: 28.3 pg (ref 26.6–33.0)
MCHC: 30.8 g/dL — ABNORMAL LOW (ref 31.5–35.7)
MCV: 92 fL (ref 79–97)
Monocytes Absolute: 0.5 x10E3/uL (ref 0.1–0.9)
Monocytes: 7 %
Neutrophils Absolute: 4.6 x10E3/uL (ref 1.4–7.0)
Neutrophils: 56 %
Platelets: 322 x10E3/uL (ref 150–450)
RBC: 4.73 x10E6/uL (ref 3.77–5.28)
RDW: 13.9 % (ref 11.7–15.4)
WBC: 8.2 x10E3/uL (ref 3.4–10.8)

## 2023-09-12 LAB — MICROALBUMIN / CREATININE URINE RATIO
Creatinine, Urine: 131.3 mg/dL
Microalb/Creat Ratio: 91 mg/g{creat} — ABNORMAL HIGH (ref 0–29)
Microalbumin, Urine: 119.1 ug/mL

## 2023-09-12 LAB — HEMOGLOBIN A1C
Est. average glucose Bld gHb Est-mCnc: 200 mg/dL
Hgb A1c MFr Bld: 8.6 % — ABNORMAL HIGH (ref 4.8–5.6)

## 2023-09-12 LAB — TSH: TSH: 2.55 u[IU]/mL (ref 0.450–4.500)

## 2023-09-13 ENCOUNTER — Ambulatory Visit: Payer: Self-pay | Admitting: Internal Medicine

## 2023-09-14 NOTE — Unmapped (Signed)
 Dermatology Note     Assessment and Plan:      Prurigo nodularis - Severe, chronic, extensively involving patients back, buttocks, bilateral upper and lower extremities, innumerable nodules (>20 nodules), now significantly improved since restarting Dupixent   - Patient previous on dupilumab  pen injections, but stopped because she disliked injecting herself with autopens. Severely flared off Dupixent   - Since starting dupilumab  syringes, she is much improved. She finds the syringes much easier to use  - She still has numerous nodules but they are much flatter than previous and she does not seem to be getting new ones  - Advised against excoriation (discussed itch-scratch cycle), keep nails short  - Mosturization with Eucerin or Vaseline (purchase Cerave if desired), lukewarm showers, Dove sensitive skin soap to axilla/groin  - continue dupilumab  300 mg/mL syringes. Inject into the skin subcutaneously every 7 days. Prescription sent today  - start clobetasol  solution aaa bid prn for itching on scalp    The patient was advised to call for an appointment should any new, changing, or symptomatic lesions develop.     RTC: Return in about 6 months (around 03/11/2024). or sooner as needed   _________________________________________________________________    Chief Complaint     Chief Complaint   Patient presents with    Lesion Of Concern    Chronic Condition Follow-Up     3 month follow up on Prurigo nodalaris  Lesions on back, hand and head area        HPI     Monique Coleman is a 73 y.o. female who presents as a returning patient (last seen by Dr. Leora on 06/10/2023) to Dermatology for f/up prurigo nodularis. Since last visit, she has started Dupixent  syringes and skin has improved significantly. Itch is also less. But she does have itching in her scalp  The patient denies any other new or changing lesions or areas of concern.     Pertinent Past Medical History     No history of skin cancer    Family History: Negative for melanoma    Past Medical History, Family History, Social History, Medication List, Allergies, and Problem List were reviewed in the rooming section of Epic.     ROS: Other than symptoms mentioned in the HPI, no fevers, chills, or other skin complaints    Physical Examination     GENERAL: Well-appearing female in no acute distress, resting comfortably.  NEURO: Alert and oriented, answers questions appropriately  PSYCH: Normal mood and affect  SKIN (Full Skin Exam): Examination of the face, eyelids, lips, nose, ears, neck, chest, abdomen, back, arms, legs, hands, feet, palms, soles, nails was performed, including scalp, including buttocks  - innumerable hyperpigmented firm papules nodules and plaques on upper back lower back buttocks, bilateral upper and lower extremities, dorsal hands; > 20 lesions; significantly flatter than previous with no new lesions    All areas not commented on are within normal limits or unremarkable

## 2023-09-19 NOTE — Unmapped (Signed)
 [facility] Delivery [facility] Refill Coordination Note    Specialty Medication(s) to be Shipped:   Inflammatory Disorders: Dupixent     Other medication(s) to be shipped: No additional medications requested for fill at this time     Monique Coleman, DOB: 09-16-1950  Phone: There are no phone numbers on file.      All above HIPAA information was verified with patient.     Was a Nurse, learning disability used for this call? No    Completed refill call assessment today to schedule patient's medication shipment from the [facility]  [phone]).  All relevant notes have been reviewed.     Specialty medication(s) and dose(s) confirmed: Regimen is correct and unchanged.   Changes to medications: Monique Coleman reports no changes at this time.  Changes to insurance: No  New side effects reported not previously addressed with a pharmacist or physician: None reported  Questions for the pharmacist: No    Confirmed patient received a Conservation officer, historic buildings and a Surveyor, mining with first shipment. The patient will receive a drug information handout for each medication shipped and additional FDA Medication Guides as required.       DISEASE/MEDICATION-SPECIFIC INFORMATION        For patients on injectable medications: Patient currently has 0 doses left.  Next injection is scheduled for 10/03/2023.    SPECIALTY MEDICATION ADHERENCE     Medication Adherence    Patient reported X missed doses in the last month: 0  Specialty Medication: dupilumab  (DUPIXENT  SYRINGE) 300 mg/2 mL syringe  Patient is on additional specialty medications: No  Informant: patient              Were doses missed due to medication being on hold? No    dupilumab : DUPIXENT  SYRINGE 300 mg/2 mL syringe 0 days of medicine on hand     REFERRAL TO PHARMACIST     Referral to the pharmacist: Not needed      [facility]     Shipping address confirmed in Epic.     Cost and Payment: Patient has a $0 copay, payment information is not required.    Delivery Scheduled: Yes, Expected medication delivery date: 09/26/2023.     Medication will be delivered via Same Day Courier to the prescription address in Epic WAM.    Monique Coleman Specialty and [facility]

## 2023-09-26 MED FILL — DUPIXENT 300 MG/2 ML SUBCUTANEOUS SYRINGE: SUBCUTANEOUS | 28 days supply | Qty: 4 | Fill #3

## 2023-09-30 ENCOUNTER — Other Ambulatory Visit: Payer: Self-pay | Admitting: Internal Medicine

## 2023-09-30 DIAGNOSIS — J309 Allergic rhinitis, unspecified: Secondary | ICD-10-CM

## 2023-10-01 NOTE — Telephone Encounter (Signed)
 Requested Prescriptions  Pending Prescriptions Disp Refills   montelukast  (SINGULAIR ) 10 MG tablet [Pharmacy Med Name: Montelukast  Sodium 10 MG Oral Tablet] 100 tablet 1    Sig: TAKE 1 TABLET BY MOUTH DAILY     Pulmonology:  Leukotriene Inhibitors Passed - 10/01/2023  3:07 PM      Passed - Valid encounter within last 12 months    Recent Outpatient Visits           3 weeks ago Annual physical exam   [place] [facility] at [facility], Leita DEL, MD   2 months ago Vertigo   [facility] at [facility], Leita DEL, MD   2 months ago Essential hypertension   [place] [facility] at [facility], Leita DEL, MD   4 months ago Vasovagal syncope   [facility] at [facility], Leita DEL, MD   4 months ago Type II diabetes mellitus with complication [facility])   [place] [facility] at [facility], Leita DEL, MD

## 2023-10-16 DIAGNOSIS — E1142 Type 2 diabetes mellitus with diabetic polyneuropathy: Secondary | ICD-10-CM | POA: Diagnosis not present

## 2023-10-17 NOTE — Unmapped (Signed)
 [facility] and Home Delivery Pharmacy Refill Coordination Note    Specialty Medication(s) to be Shipped:   Inflammatory Disorders: Dupixent     Other medication(s) to be shipped: No additional medications requested for fill at this time    Specialty Medications not needed at this time: N/A     Monique Coleman, DOB: 05/19/1950  Phone: There are no phone numbers on file.      All above HIPAA information was verified with patient.     Was a Nurse, learning disability used for this call? No    Completed refill call assessment today to schedule patient's medication shipment from the [facility]  [phone]).  All relevant notes have been reviewed.     Specialty medication(s) and dose(s) confirmed: Regimen is correct and unchanged.   Changes to medications: [name] reports no changes at this time.  Changes to insurance: No  New side effects reported not previously addressed with a pharmacist or physician: None reported  Questions for the pharmacist: No    Confirmed patient received a Conservation officer, historic buildings and a Surveyor, mining with first shipment. The patient will receive a drug information handout for each medication shipped and additional FDA Medication Guides as required.       DISEASE/MEDICATION-SPECIFIC INFORMATION        For patients on injectable medications: Next injection is scheduled for 8/22.    SPECIALTY MEDICATION ADHERENCE     Medication Adherence    Patient reported X missed doses in the last month: 0  Specialty Medication: dupilumab  (DUPIXENT  SYRINGE) 300 mg/2 mL syringe  Patient is on additional specialty medications: No  Informant: patient              Were doses missed due to medication being on hold? No    dupilumab  (DUPIXENT  SYRINGE) 300 mg/2 mL syringe : 1 doses of medicine on hand       REFERRAL TO PHARMACIST     Referral to the pharmacist: Not needed      SHIPPING     Shipping address confirmed in Epic.     Cost and Payment: Patient has a $0 copay, payment information is not required.    Delivery Scheduled: Yes, Expected medication delivery date: 8/28.     Medication will be delivered via Same Day Courier to the prescription address in Epic WAM.    Monique Coleman [facility] Specialty and [facility]

## 2023-10-23 MED FILL — DUPIXENT 300 MG/2 ML SUBCUTANEOUS SYRINGE: SUBCUTANEOUS | 28 days supply | Qty: 4 | Fill #4

## 2023-11-06 ENCOUNTER — Ambulatory Visit: Payer: Self-pay | Admitting: Internal Medicine

## 2023-11-06 ENCOUNTER — Ambulatory Visit
Admission: RE | Admit: 2023-11-06 | Discharge: 2023-11-06 | Disposition: A | Discharge status: Home / Self Care | Source: Ambulatory Visit | Attending: Internal Medicine | Admitting: Internal Medicine

## 2023-11-06 DIAGNOSIS — Z780 Asymptomatic menopausal state: Secondary | ICD-10-CM | POA: Insufficient documentation

## 2023-11-06 DIAGNOSIS — M8580 Other specified disorders of bone density and structure, unspecified site: Secondary | ICD-10-CM | POA: Diagnosis not present

## 2023-11-06 DIAGNOSIS — Z1231 Encounter for screening mammogram for malignant neoplasm of breast: Secondary | ICD-10-CM | POA: Insufficient documentation

## 2023-11-06 DIAGNOSIS — M8589 Other specified disorders of bone density and structure, multiple sites: Secondary | ICD-10-CM | POA: Diagnosis not present

## 2023-11-17 DIAGNOSIS — E1142 Type 2 diabetes mellitus with diabetic polyneuropathy: Secondary | ICD-10-CM | POA: Diagnosis not present

## 2023-11-17 DIAGNOSIS — E785 Hyperlipidemia, unspecified: Secondary | ICD-10-CM | POA: Diagnosis not present

## 2023-11-17 DIAGNOSIS — I152 Hypertension secondary to endocrine disorders: Secondary | ICD-10-CM | POA: Diagnosis not present

## 2023-11-17 DIAGNOSIS — E1159 Type 2 diabetes mellitus with other circulatory complications: Secondary | ICD-10-CM | POA: Diagnosis not present

## 2023-11-17 DIAGNOSIS — Z794 Long term (current) use of insulin: Secondary | ICD-10-CM | POA: Diagnosis not present

## 2023-11-17 DIAGNOSIS — E1169 Type 2 diabetes mellitus with other specified complication: Secondary | ICD-10-CM | POA: Diagnosis not present

## 2023-11-20 NOTE — Unmapped (Signed)
 [facility] Specialty and Home Delivery Pharmacy Refill Coordination Note    Specialty Medication(s) to be Shipped:   Inflammatory Disorders: Dupixent     Other medication(s) to be shipped: No additional medications requested for fill at this time    Specialty Medications not needed at this time: N/A     Monique Coleman, DOB: [birth year]  Phone: There are no phone numbers on file.      All above HIPAA information was verified with patient.     Was a Nurse, learning disability used for this call? No    Completed refill call assessment today to schedule patient's medication shipment from the [facility]  [phone]).  All relevant notes have been reviewed.     Specialty medication(s) and dose(s) confirmed: Regimen is correct and unchanged.   Changes to medications: Nimra reports no changes at this time.  Changes to insurance: No  New side effects reported not previously addressed with a pharmacist or physician: None reported  Questions for the pharmacist: No    Confirmed patient received a Conservation officer, historic buildings and a Surveyor, mining with first shipment. The patient will receive a drug information handout for each medication shipped and additional FDA Medication Guides as required.       DISEASE/MEDICATION-SPECIFIC INFORMATION        For patients on injectable medications: Next injection is scheduled for 11/28/2023.    SPECIALTY MEDICATION ADHERENCE     Medication Adherence    Specialty Medication: DUPIXENT  SYRINGE 300 mg/2 mL syringe (dupilumab )              Were doses missed due to medication being on hold? No    DUPIXENT  SYRINGE 300 mg/2 mL syringe (dupilumab )  : 0 doses of medicine on hand     REFERRAL TO PHARMACIST     Referral to the pharmacist: Not needed      SHIPPING     Shipping address confirmed in Epic.     Cost and Payment: Patient has a $0 copay, payment information is not required.    Delivery Scheduled: Yes, Expected medication delivery date: 11/26/2023.     Medication will be delivered via Same Day Courier to the prescription address in Epic WAM.    Monique Coleman   [facility] Delivery Pharm[facility]  Specialty Technician

## 2023-11-26 MED FILL — DUPIXENT 300 MG/2 ML SUBCUTANEOUS SYRINGE: SUBCUTANEOUS | 28 days supply | Qty: 4 | Fill #5

## 2023-11-27 ENCOUNTER — Other Ambulatory Visit: Payer: Self-pay | Admitting: Internal Medicine

## 2023-11-27 ENCOUNTER — Telehealth: Payer: Self-pay

## 2023-11-27 DIAGNOSIS — R110 Nausea: Secondary | ICD-10-CM

## 2023-11-27 NOTE — Telephone Encounter (Signed)
 Copied from CRM [id number]. Topic: Clinical - Medication Question >> Nov 27, 2023  2:36 PM Santiya F wrote: Reason for CRM: Patient is calling in because she would like to speak with her provider about her medication metoprolol  tartrate (LOPRESSOR ) 25 MG tablet [[id number]] She said she was taking the medication 50 mg twice a day and per patient, Dr. Justus advised her to take 25 Mg twice a day instead. Patient says she has been taking the medication for a long time and she has been taking 50 mg twice day and that helps. Patient says the last 3/4 weeks she has been having the heart palpitations at night, and believes it's because of the change of dosage. Please follow up with patient.

## 2023-11-28 NOTE — Telephone Encounter (Signed)
 Requested medications are due for refill today.  yes  Requested medications are on the active medications list.  yes  Last refill. 07/25/2023 #20 0 rf  Future visit scheduled.   yes  Notes to clinic.  Refill not delegated.    Requested Prescriptions  Pending Prescriptions Disp Refills   ondansetron  (ZOFRAN ) 4 MG tablet [Pharmacy Med Name: ONDANSETRON   4MG   TAB] 20 tablet 0    Sig: TAKE 1 TABLET BY MOUTH EVERY 8  HOURS AS NEEDED     Not Delegated - Gastroenterology: Antiemetics - ondansetron  Failed - 11/28/2023  2:59 PM      Failed - This refill cannot be delegated      Passed - AST in normal range and within 360 days    AST  Date Value Ref Range Status  09/11/2023 17 0 - 40 IU/L Final         Passed - ALT in normal range and within 360 days    ALT  Date Value Ref Range Status  09/11/2023 20 0 - 32 IU/L Final         Passed - Valid encounter within last 6 months    Recent Outpatient Visits           2 months ago Annual physical exam   [place] [facility], Leita DEL, MD   4 months ago Vertigo   [facility] at [facility], Leita DEL, MD   4 months ago Essential hypertension   [place] [facility] at [facility], Leita DEL, MD   5 months ago Vasovagal syncope   [facility] at [facility], Leita DEL, MD   6 months ago Type II diabetes mellitus with complication [facility])   [place] [facility] at [facility], Leita DEL, MD

## 2023-11-28 NOTE — Telephone Encounter (Signed)
 Spoke with patient- she is scheduled for Tuesday for BP check. Pt informed if having chest pain or Sob to go straight to the ER.

## 2023-12-02 ENCOUNTER — Encounter: Payer: Self-pay | Admitting: Internal Medicine

## 2023-12-02 ENCOUNTER — Ambulatory Visit (INDEPENDENT_AMBULATORY_CARE_PROVIDER_SITE_OTHER): Admitting: Internal Medicine

## 2023-12-02 VITALS — BP 118/76 | HR 70 | Ht 59.0 in | Wt 134.0 lb

## 2023-12-02 DIAGNOSIS — Z23 Encounter for immunization: Secondary | ICD-10-CM

## 2023-12-02 DIAGNOSIS — L281 Prurigo nodularis: Secondary | ICD-10-CM

## 2023-12-02 DIAGNOSIS — B079 Viral wart, unspecified: Secondary | ICD-10-CM | POA: Diagnosis not present

## 2023-12-02 DIAGNOSIS — I10 Essential (primary) hypertension: Secondary | ICD-10-CM

## 2023-12-02 NOTE — Assessment & Plan Note (Signed)
 with recurrent and new dark pruritic skin lesions she is on Dupixent  recommend topical cortisone if needed to limit excoriation

## 2023-12-02 NOTE — Progress Notes (Signed)
 Date:  12/02/2023   Name:  Elizabeth Klein   DOB:  [birth year]   MRN:  [id number]   Chief Complaint: Hypertension  Hypertension This is a chronic problem. The problem is controlled. Pertinent negatives include no chest pain, headaches, palpitations or shortness of breath. Past treatments include beta blockers. The current treatment provides significant improvement.  Foot lesion - on bottom of right foot - hard nodule that hurts to walk.  She has been using salicylic acid pads.   Review of Systems  Constitutional:  Negative for fatigue and unexpected weight change.  HENT:  Negative for trouble swallowing.   Eyes:  Negative for visual disturbance.  Respiratory:  Negative for cough, chest tightness, shortness of breath and wheezing.   Cardiovascular:  Negative for chest pain, palpitations and leg swelling.  Gastrointestinal:  Negative for abdominal pain, constipation and diarrhea.  Musculoskeletal:  Negative for arthralgias and myalgias.  Neurological:  Negative for dizziness, weakness, light-headedness and headaches.     Lab Results  Component Value Date   NA 139 09/11/2023   K 5.0 09/11/2023   CO2 20 09/11/2023   GLUCOSE 146 (H) 09/11/2023   BUN 18 09/11/2023   CREATININE 1.06 (H) 09/11/2023   CALCIUM  9.9 09/11/2023   EGFR 56 (L) 09/11/2023   GFRNONAA >60 03/06/2022   Lab Results  Component Value Date   CHOL 160 09/11/2023   HDL 44 09/11/2023   LDLCALC 81 09/11/2023   TRIG 210 (H) 09/11/2023   CHOLHDL 3.6 09/11/2023   Lab Results  Component Value Date   TSH 2.550 09/11/2023   Lab Results  Component Value Date   HGBA1C 8.6 (H) 09/11/2023   Lab Results  Component Value Date   WBC 8.2 09/11/2023   HGB 13.4 09/11/2023   HCT 43.5 09/11/2023   MCV 92 09/11/2023   PLT 322 09/11/2023   Lab Results  Component Value Date   ALT 20 09/11/2023   AST 17 09/11/2023   ALKPHOS 101 09/11/2023   BILITOT 0.6 09/11/2023   Lab Results  Component Value Date   VD25OH  38.6 08/30/2021     Patient Active Problem List   Diagnosis Date Noted   Vasovagal syncope 06/03/2023   Migraine without aura and without status migrainosus, not intractable 02/10/2023   Psoriasis 02/10/2023   Impingement syndrome of shoulder, right 07/09/2021   Cervical paraspinal muscle spasm 07/09/2021   Degeneration of lumbar intervertebral disc 04/19/2021   Oropharyngeal dysphagia 02/23/2019   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   CKD (chronic kidney disease) stage 3, GFR 30-59 ml/min (HCC) 04/30/2017   Type II diabetes mellitus with complication (HCC) 03/19/2017   Essential hypertension 03/19/2017   CAD (coronary artery disease) 03/19/2017   History of kidney stones 03/19/2017   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   Hyperlipidemia associated with type 2 diabetes mellitus (HCC) 03/19/2017   Nocturnal leg cramps 03/19/2017   Primary insomnia 03/19/2017   Allergic rhinitis 03/19/2017    Allergies  Allergen Reactions   Prednisone Other (See Comments)    Intolerance  - confusion     Past Surgical History:  Procedure Laterality Date   BREAST BIOPSY Right    neg   CATARACT EXTRACTION W/PHACO Left 09/27/2019   Procedure: CATARACT EXTRACTION PHACO AND INTRAOCULAR LENS PLACEMENT (IOC) LEFT DIABETIC ISTENT INJ INTRAVITREAL KENALOG  INJ;  Surgeon: Myrna Adine Anes, MD;  Location: [facility];  Service: Ophthalmology;  Laterality: Left;  2.83 0:28.7   CATARACT EXTRACTION W/PHACO Right 12/13/2019  Procedure: CATARACT EXTRACTION PHACO AND INTRAOCULAR LENS PLACEMENT (IOC) RIGHT ISTENT INJ KENALOG  INJ DIABETIC;  Surgeon: Myrna Adine Anes, MD;  Location: [facility];  Service: Ophthalmology;  Laterality: Right;  1.39 0:21.5   CESAREAN SECTION      Social History   Tobacco Use   Smoking status: Never   Smokeless tobacco: Never  Vaping Use   Vaping status: Never Used  Substance Use Topics   Alcohol use: Never   Drug use: Never      Medication list has been reviewed and updated.  Current Meds  Medication Sig   ACCU-CHEK GUIDE TEST test strip USE TO CHECK BLOOD SUGAR 3 TIMES DAILY   acetaminophen  (TYLENOL ) 500 MG tablet Take 1,000 mg by mouth every 8 (eight) hours as needed.   Ascorbic Acid (VITAMIN C PO) Take by mouth.   aspirin  EC 81 MG tablet Take 1 tablet (81 mg total) by mouth daily.   atorvastatin  (LIPITOR) 80 MG tablet Take 1 tablet (80 mg total) by mouth daily.   B-D INS SYR ULTRAFINE .5CC/30G 30G X 1/2 0.5 ML MISC USE WITH LANTUS  INJECTIONS FOR  TYPE 2 DIABETES MELLITUS   blood glucose meter kit and supplies Dispense based on patient and insurance preference. Use up to four times daily as directed. (FOR ICD-10 E10.9, E11.9).   clobetasol  (TEMOVATE ) 0.05 % external solution APPLY TOPICALLY TWICE DAILY TO  SCALP LESIONS   clobetasol  ointment (TEMOVATE ) 0.05 % Apply 1 application topically 2 (two) times daily.   DUPIXENT 300 MG/2ML prefilled syringe Inject 300 mg into the skin once.   empagliflozin  (JARDIANCE ) 25 MG TABS tablet Take 1 tablet (25 mg total) by mouth daily before breakfast.   gabapentin  (NEURONTIN ) 100 MG capsule TAKE 1 CAPSULE BY MOUTH 3 TIMES  DAILY   glimepiride  (AMARYL ) 2 MG tablet TAKE 1 TABLET BY MOUTH DAILY  WITH BREAKFAST   hydrocortisone  2.5 % ointment Apply topically 2 (two) times daily. To lesion on arm and buttocks   insulin  glargine (LANTUS ) 100 UNIT/ML injection Inject 25 Units into the skin daily.   meclizine  (ANTIVERT ) 25 MG tablet Take 1 tablet (25 mg total) by mouth 3 (three) times daily as needed for dizziness.   meloxicam  (MOBIC ) 7.5 MG tablet Take 1 tablet (7.5 mg total) by mouth daily.   metFORMIN  (GLUCOPHAGE ) 1000 MG tablet Take 1 tablet (1,000 mg total) by mouth daily with breakfast.   metoprolol  tartrate (LOPRESSOR ) 25 MG tablet Take 1 tablet (25 mg total) by mouth 2 (two) times daily.   montelukast  (SINGULAIR ) 10 MG tablet TAKE 1 TABLET BY MOUTH DAILY   Omega-3 Fatty  Acids (FISH OIL) 1000 MG CAPS Take by mouth.   omeprazole  (PRILOSEC) 20 MG capsule TAKE 1 CAPSULE BY MOUTH DAILY   ondansetron  (ZOFRAN ) 4 MG tablet TAKE 1 TABLET BY MOUTH EVERY 8  HOURS AS NEEDED   ONETOUCH DELICA PLUS LANCETS MISC 1 each by Does not apply route 2 (two) times daily.   OZEMPIC, 2 MG/DOSE, 8 MG/3ML SOPN Inject 2 mg into the skin once a week.   traZODone  (DESYREL ) 50 MG tablet TAKE 1 TABLET BY MOUTH AT  BEDTIME   vitamin B-12 (CYANOCOBALAMIN) 1000 MCG tablet Take 1,000 mcg by mouth daily.   [DISCONTINUED] insulin  glargine (LANTUS ) 100 UNIT/ML injection Inject 0.4 mLs (40 Units total) into the skin every morning. Pt taking QAM       09/05/2023   10:04 AM 06/03/2023    8:47 AM 05/06/2023    2:27 PM  03/05/2023    1:41 PM  GAD 7 : Generalized Anxiety Score  Nervous, Anxious, on Edge 0 1 1 1   Control/stop worrying 0 1 1 1   Worry too much - different things 0 1 1 1   Trouble relaxing 0 1 1 0  Restless 0 1 1 0  Easily annoyed or irritable 0 2 1 0  Afraid - awful might happen 0 1 1 0  Total GAD 7 Score 0 8 7 3   Anxiety Difficulty Not difficult at all Somewhat difficult  Not difficult at all       09/05/2023   10:04 AM 06/17/2023   11:15 AM 06/03/2023    8:47 AM  Depression screen PHQ 2/9  Decreased Interest 0 0 2  Down, Depressed, Hopeless 0 0 0  PHQ - 2 Score 0 0 2  Altered sleeping 0  1  Tired, decreased energy 0  2  Change in appetite 0  1  Feeling bad or failure about yourself  0  1  Trouble concentrating 0  1  Moving slowly or fidgety/restless 0  2  Suicidal thoughts 0  0  PHQ-9 Score 0  10  Difficult doing work/chores Not difficult at all      BP Readings from Last 3 Encounters:  12/02/23 118/76  09/05/23 124/74  07/25/23 106/70    Physical Exam Vitals and nursing note reviewed.  Constitutional:      General: She is not in acute distress.    Appearance: Normal appearance. She is well-developed.  HENT:     Head: Normocephalic and atraumatic.   Cardiovascular:     Rate and Rhythm: Normal rate and regular rhythm.     Heart sounds: No murmur heard. Pulmonary:     Effort: Pulmonary effort is normal. No respiratory distress.     Breath sounds: No wheezing or rhonchi.  Musculoskeletal:     Cervical back: Normal range of motion.     Right lower leg: No edema.     Left lower leg: No edema.  Lymphadenopathy:     Cervical: Cervical adenopathy present.  Skin:    General: Skin is warm and dry.     Findings: Lesion present. No rash.     Comments: Hard 2-3 mm raised warty lesion on sole of right foot.  Neurological:     Mental Status: She is alert and oriented to person, place, and time.  Psychiatric:        Mood and Affect: Mood normal.        Behavior: Behavior normal.     Wt Readings from Last 3 Encounters:  12/02/23 134 lb (60.8 kg)  09/05/23 134 lb (60.8 kg)  07/25/23 136 lb (61.7 kg)    BP 118/76   Pulse 70   Ht 4' 11 (1.499 m)   Wt 134 lb (60.8 kg)   SpO2 97%   BMI 27.06 kg/m   Assessment and Plan:  Problem List Items Addressed This Visit       Unprioritized   Essential hypertension - Primary (Chronic)   Well controlled blood pressure today. Current regimen is metoprolol  25 mg bid. No medication side effects noted.        Prurigo nodularis   with recurrent and new dark pruritic skin lesions she is on Dupixent  recommend topical cortisone if needed to limit excoriation       Other Visit Diagnoses       Encounter for immunization       Relevant Orders  Flu vaccine HIGH DOSE PF(Fluzone Trivalent) (Completed)     Warts of foot       advised to stop using callus removal pads will refer to Podiatry   Relevant Orders   Ambulatory referral to Podiatry       No follow-ups on file.    Leita HILARIO Adie, MD [facility] [place]

## 2023-12-02 NOTE — Assessment & Plan Note (Signed)
 Well controlled blood pressure today. Current regimen is metoprolol  25 mg bid. No medication side effects noted.

## 2023-12-17 NOTE — Unmapped (Signed)
 [facility] Refill Coordination Note    Specialty Medication(s) to be Shipped:   Inflammatory Disorders: Dupixent     Other medication(s) to be shipped: No additional medications requested for fill at this time    Specialty Medications not needed at this time: N/A     Monique Coleman, DOB: 1950-03-16  Phone: There are no phone numbers on file.      All above HIPAA information was verified with patient.     Was a Nurse, learning disability used for this call? No    Completed refill call assessment today to schedule patient's medication shipment from the [facility]  [phone]).  All relevant notes have been reviewed.     Specialty medication(s) and dose(s) confirmed: Regimen is correct and unchanged.   Changes to medications: Monique Coleman reports no changes at this time.  Changes to insurance: No  New side effects reported not previously addressed with a pharmacist or physician: None reported  Questions for the pharmacist: No    Confirmed patient received a Conservation officer, historic buildings and a Surveyor, mining with first shipment. The patient will receive a drug information handout for each medication shipped and additional FDA Medication Guides as required.       DISEASE/MEDICATION-SPECIFIC INFORMATION        For patients on injectable medications: Next injection is scheduled for 12/26/2023.    SPECIALTY MEDICATION ADHERENCE     Medication Adherence    Patient reported X missed doses in the last month: 0  Specialty Medication: DUPIXENT  SYRINGE 300 mg/2 mL syringe (dupilumab )  Patient is on additional specialty medications: No              Were doses missed due to medication being on hold? No     DUPIXENT  SYRINGE 300 mg/2 mL syringe (dupilumab ): 0 doses of medicine on hand       REFERRAL TO PHARMACIST     Referral to the pharmacist: Not needed      [facility]     Shipping address confirmed in Epic.     Cost and Payment: Patient has a $0 copay, payment information is not required.    Delivery Scheduled: Yes, Expected medication delivery date: 12/24/2023.     Medication will be delivered via Same Day Courier to the prescription address in Epic WAM.    Monique Coleman   [facility]  Specialty Technician

## 2023-12-24 MED FILL — DUPIXENT 300 MG/2 ML SUBCUTANEOUS SYRINGE: SUBCUTANEOUS | 28 days supply | Qty: 4 | Fill #6

## 2024-01-09 ENCOUNTER — Encounter: Payer: Self-pay | Admitting: Internal Medicine

## 2024-01-09 ENCOUNTER — Ambulatory Visit (INDEPENDENT_AMBULATORY_CARE_PROVIDER_SITE_OTHER): Admitting: Internal Medicine

## 2024-01-09 VITALS — BP 122/74 | HR 85 | Ht 59.0 in | Wt 133.0 lb

## 2024-01-09 DIAGNOSIS — I10 Essential (primary) hypertension: Secondary | ICD-10-CM

## 2024-01-09 DIAGNOSIS — Z7984 Long term (current) use of oral hypoglycemic drugs: Secondary | ICD-10-CM

## 2024-01-09 DIAGNOSIS — E118 Type 2 diabetes mellitus with unspecified complications: Secondary | ICD-10-CM

## 2024-01-09 DIAGNOSIS — E1140 Type 2 diabetes mellitus with diabetic neuropathy, unspecified: Secondary | ICD-10-CM | POA: Diagnosis not present

## 2024-01-09 NOTE — Patient Instructions (Signed)
 Add a second dose of Lantus  5-10 units every evening.

## 2024-01-09 NOTE — Assessment & Plan Note (Signed)
 Well controlled blood pressure today. Current regimen is metoprolol . No medication side effects noted.

## 2024-01-09 NOTE — Assessment & Plan Note (Addendum)
 Currently medications are glimepiride , Ozempic 2 mg, MTF, Lantus  25 units and Jardiance .  No hypoglycemic episodes noted. Home blood sugars in the 160 range. Last visit medical regimen changes were to increase glimepiride  to one whole tablet. Lab Results  Component Value Date   HGBA1C 8.6 (H) 09/11/2023  A1C = 9.1 today. Will add a second Lantus  5-10 u PM

## 2024-01-09 NOTE — Assessment & Plan Note (Signed)
 Symptoms relieved by gabapentin .

## 2024-01-09 NOTE — Progress Notes (Signed)
 Date:  01/09/2024   Name:  Elizabeth Klein   DOB:  [birth year]   MRN:  [id number]   Chief Complaint: Hypertension and Diabetes  Hypertension This is a chronic problem. The problem is controlled. Pertinent negatives include no chest pain, headaches, palpitations or shortness of breath.  Diabetes She presents for her follow-up diabetic visit. She has type 2 diabetes mellitus. Pertinent negatives for hypoglycemia include no headaches, nervousness/anxiousness or tremors. Pertinent negatives for diabetes include no chest pain, no fatigue, no polydipsia and no polyuria.    Review of Systems  Constitutional:  Negative for appetite change, fatigue, fever and unexpected weight change.  HENT:  Negative for tinnitus and trouble swallowing.   Eyes:  Negative for visual disturbance.  Respiratory:  Negative for cough, chest tightness and shortness of breath.   Cardiovascular:  Negative for chest pain, palpitations and leg swelling.  Gastrointestinal:  Negative for abdominal pain.  Endocrine: Negative for polydipsia and polyuria.  Genitourinary:  Negative for dysuria and hematuria.  Musculoskeletal:  Negative for arthralgias.  Neurological:  Negative for tremors, numbness and headaches.  Psychiatric/Behavioral:  Negative for dysphoric mood and sleep disturbance. The patient is not nervous/anxious.      Lab Results  Component Value Date   NA 139 09/11/2023   K 5.0 09/11/2023   CO2 20 09/11/2023   GLUCOSE 146 (H) 09/11/2023   BUN 18 09/11/2023   CREATININE 1.06 (H) 09/11/2023   CALCIUM  9.9 09/11/2023   EGFR 56 (L) 09/11/2023   GFRNONAA >60 03/06/2022   Lab Results  Component Value Date   CHOL 160 09/11/2023   HDL 44 09/11/2023   LDLCALC 81 09/11/2023   TRIG 210 (H) 09/11/2023   CHOLHDL 3.6 09/11/2023   Lab Results  Component Value Date   TSH 2.550 09/11/2023   Lab Results  Component Value Date   HGBA1C 8.6 (H) 09/11/2023   Lab Results  Component Value Date   WBC 8.2  09/11/2023   HGB 13.4 09/11/2023   HCT 43.5 09/11/2023   MCV 92 09/11/2023   PLT 322 09/11/2023   Lab Results  Component Value Date   ALT 20 09/11/2023   AST 17 09/11/2023   ALKPHOS 101 09/11/2023   BILITOT 0.6 09/11/2023   Lab Results  Component Value Date   VD25OH 38.6 08/30/2021     Patient Active Problem List   Diagnosis Date Noted   Vasovagal syncope 06/03/2023   Migraine without aura and without status migrainosus, not intractable 02/10/2023   Psoriasis 02/10/2023   Impingement syndrome of shoulder, right 07/09/2021   Cervical paraspinal muscle spasm 07/09/2021   Degeneration of lumbar intervertebral disc 04/19/2021   Oropharyngeal dysphagia 02/23/2019   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   CKD (chronic kidney disease) stage 3, GFR 30-59 ml/min (HCC) 04/30/2017   Type II diabetes mellitus with complication (HCC) 03/19/2017   Essential hypertension 03/19/2017   CAD (coronary artery disease) 03/19/2017   History of kidney stones 03/19/2017   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   Hyperlipidemia associated with type 2 diabetes mellitus (HCC) 03/19/2017   Nocturnal leg cramps 03/19/2017   Primary insomnia 03/19/2017   Allergic rhinitis 03/19/2017    Allergies  Allergen Reactions   Prednisone Other (See Comments)    Intolerance  - confusion     Past Surgical History:  Procedure Laterality Date   BREAST BIOPSY Right    neg   CATARACT EXTRACTION W/PHACO Left 09/27/2019   Procedure: CATARACT EXTRACTION PHACO AND INTRAOCULAR  LENS PLACEMENT (IOC) LEFT DIABETIC ISTENT INJ INTRAVITREAL KENALOG  INJ;  Surgeon: Myrna Adine Anes, MD;  Location: [facility];  Service: Ophthalmology;  Laterality: Left;  2.83 0:28.7   CATARACT EXTRACTION W/PHACO Right 12/13/2019   Procedure: CATARACT EXTRACTION PHACO AND INTRAOCULAR LENS PLACEMENT (IOC) RIGHT ISTENT INJ KENALOG  INJ DIABETIC;  Surgeon: Myrna Adine Anes, MD;  Location: [facility];  Service:  Ophthalmology;  Laterality: Right;  1.39 0:21.5   CESAREAN SECTION      Social History   Tobacco Use   Smoking status: Never   Smokeless tobacco: Never  Vaping Use   Vaping status: Never Used  Substance Use Topics   Alcohol use: Never   Drug use: Never     Medication list has been reviewed and updated.  Current Meds  Medication Sig   ACCU-CHEK GUIDE TEST test strip USE TO CHECK BLOOD SUGAR 3 TIMES DAILY   acetaminophen  (TYLENOL ) 500 MG tablet Take 1,000 mg by mouth every 8 (eight) hours as needed.   Ascorbic Acid (VITAMIN C PO) Take by mouth.   aspirin  EC 81 MG tablet Take 1 tablet (81 mg total) by mouth daily.   atorvastatin  (LIPITOR) 80 MG tablet Take 1 tablet (80 mg total) by mouth daily.   B-D INS SYR ULTRAFINE .5CC/30G 30G X 1/2 0.5 ML MISC USE WITH LANTUS  INJECTIONS FOR  TYPE 2 DIABETES MELLITUS   blood glucose meter kit and supplies Dispense based on patient and insurance preference. Use up to four times daily as directed. (FOR ICD-10 E10.9, E11.9).   clobetasol  (TEMOVATE ) 0.05 % external solution APPLY TOPICALLY TWICE DAILY TO  SCALP LESIONS   clobetasol  ointment (TEMOVATE ) 0.05 % Apply 1 application topically 2 (two) times daily.   DUPIXENT 300 MG/2ML prefilled syringe Inject 300 mg into the skin once.   empagliflozin  (JARDIANCE ) 25 MG TABS tablet Take 1 tablet (25 mg total) by mouth daily before breakfast.   gabapentin  (NEURONTIN ) 100 MG capsule TAKE 1 CAPSULE BY MOUTH 3 TIMES  DAILY   glimepiride  (AMARYL ) 2 MG tablet TAKE 1 TABLET BY MOUTH DAILY  WITH BREAKFAST   hydrocortisone  2.5 % ointment Apply topically 2 (two) times daily. To lesion on arm and buttocks   insulin  glargine (LANTUS ) 100 UNIT/ML injection Inject 25 Units into the skin daily.   meclizine  (ANTIVERT ) 25 MG tablet Take 1 tablet (25 mg total) by mouth 3 (three) times daily as needed for dizziness.   meloxicam  (MOBIC ) 7.5 MG tablet Take 1 tablet (7.5 mg total) by mouth daily.   metFORMIN  (GLUCOPHAGE )  1000 MG tablet Take 1 tablet (1,000 mg total) by mouth daily with breakfast.   metoprolol  tartrate (LOPRESSOR ) 25 MG tablet Take 1 tablet (25 mg total) by mouth 2 (two) times daily.   montelukast  (SINGULAIR ) 10 MG tablet TAKE 1 TABLET BY MOUTH DAILY   Omega-3 Fatty Acids (FISH OIL) 1000 MG CAPS Take by mouth.   omeprazole  (PRILOSEC) 20 MG capsule TAKE 1 CAPSULE BY MOUTH DAILY   ondansetron  (ZOFRAN ) 4 MG tablet TAKE 1 TABLET BY MOUTH EVERY 8  HOURS AS NEEDED   ONETOUCH DELICA PLUS LANCETS MISC 1 each by Does not apply route 2 (two) times daily.   OZEMPIC, 2 MG/DOSE, 8 MG/3ML SOPN Inject 2 mg into the skin once a week.   traZODone  (DESYREL ) 50 MG tablet TAKE 1 TABLET BY MOUTH AT  BEDTIME   vitamin B-12 (CYANOCOBALAMIN) 1000 MCG tablet Take 1,000 mcg by mouth daily.       09/05/2023  10:04 AM 06/03/2023    8:47 AM 05/06/2023    2:27 PM 03/05/2023    1:41 PM  GAD 7 : Generalized Anxiety Score  Nervous, Anxious, on Edge 0 1 1 1   Control/stop worrying 0 1 1 1   Worry too much - different things 0 1 1 1   Trouble relaxing 0 1 1 0  Restless 0 1 1 0  Easily annoyed or irritable 0 2 1 0  Afraid - awful might happen 0 1 1 0  Total GAD 7 Score 0 8 7 3   Anxiety Difficulty Not difficult at all Somewhat difficult  Not difficult at all       09/05/2023   10:04 AM 06/17/2023   11:15 AM 06/03/2023    8:47 AM  Depression screen PHQ 2/9  Decreased Interest 0 0 2  Down, Depressed, Hopeless 0 0 0  PHQ - 2 Score 0 0 2  Altered sleeping 0  1  Tired, decreased energy 0  2  Change in appetite 0  1  Feeling bad or failure about yourself  0  1  Trouble concentrating 0  1  Moving slowly or fidgety/restless 0  2  Suicidal thoughts 0  0  PHQ-9 Score 0   10   Difficult doing work/chores Not difficult at all       Data saved with a previous flowsheet row definition    BP Readings from Last 3 Encounters:  01/09/24 122/74  12/02/23 118/76  09/05/23 124/74    Physical Exam Vitals and nursing note  reviewed.  Constitutional:      General: She is not in acute distress.    Appearance: She is well-developed.  HENT:     Head: Normocephalic and atraumatic.  Cardiovascular:     Rate and Rhythm: Normal rate and regular rhythm.     Heart sounds: No murmur heard. Pulmonary:     Effort: Pulmonary effort is normal. No respiratory distress.     Breath sounds: No wheezing or rhonchi.  Musculoskeletal:     Cervical back: Normal range of motion.     Right lower leg: No edema.     Left lower leg: No edema.  Lymphadenopathy:     Cervical: No cervical adenopathy.  Skin:    General: Skin is warm and dry.     Findings: No rash.  Neurological:     Mental Status: She is alert and oriented to person, place, and time.  Psychiatric:        Mood and Affect: Mood normal.        Behavior: Behavior normal.     Wt Readings from Last 3 Encounters:  01/09/24 133 lb (60.3 kg)  12/02/23 134 lb (60.8 kg)  09/05/23 134 lb (60.8 kg)    BP 122/74   Pulse 85   Ht 4' 11 (1.499 m)   Wt 133 lb (60.3 kg)   SpO2 99%   BMI 26.86 kg/m   Assessment and Plan:  Problem List Items Addressed This Visit       Unprioritized   Type II diabetes mellitus with complication (HCC) - Primary (Chronic)   Currently medications are glimepiride , Ozempic 2 mg, MTF, Lantus  25 units and Jardiance .  No hypoglycemic episodes noted. Home blood sugars in the 160 range. Last visit medical regimen changes were to increase glimepiride  to one whole tablet. Lab Results  Component Value Date   HGBA1C 8.6 (H) 09/11/2023  A1C = 9.1 today. Will add a second Lantus  5-10 u PM  Essential hypertension (Chronic)   Well controlled blood pressure today. Current regimen is metoprolol . No medication side effects noted.        Neuropathy due to type 2 diabetes mellitus (HCC) (Chronic)   Symptoms relieved by gabapentin .      Other Visit Diagnoses       Long term current use of oral hypoglycemic drug            Return in about 4 months (around 05/08/2024) for HTN, DM - Dr. Sol.    Leita HILARIO Adie, MD [facility] Primary [facility] and Sports Medicine [place]

## 2024-01-12 ENCOUNTER — Encounter: Payer: Self-pay | Admitting: Internal Medicine

## 2024-01-12 ENCOUNTER — Ambulatory Visit: Payer: Self-pay | Admitting: Internal Medicine

## 2024-01-12 ENCOUNTER — Ambulatory Visit (INDEPENDENT_AMBULATORY_CARE_PROVIDER_SITE_OTHER): Admitting: Internal Medicine

## 2024-01-12 ENCOUNTER — Ambulatory Visit: Payer: Self-pay

## 2024-01-12 ENCOUNTER — Ambulatory Visit
Admission: RE | Admit: 2024-01-12 | Discharge: 2024-01-12 | Disposition: A | Discharge status: Home / Self Care | Source: Ambulatory Visit | Attending: Internal Medicine | Admitting: Internal Medicine

## 2024-01-12 ENCOUNTER — Ambulatory Visit
Admission: RE | Admit: 2024-01-12 | Discharge: 2024-01-12 | Disposition: A | Discharge status: Home / Self Care | Attending: Internal Medicine | Admitting: Internal Medicine

## 2024-01-12 VITALS — BP 120/74 | HR 68 | Ht 59.0 in | Wt 133.0 lb

## 2024-01-12 DIAGNOSIS — M79672 Pain in left foot: Secondary | ICD-10-CM | POA: Diagnosis not present

## 2024-01-12 NOTE — Telephone Encounter (Signed)
 Noted  Pt has appt.  KP

## 2024-01-12 NOTE — Progress Notes (Signed)
 Date:  01/12/2024   Name:  Elizabeth Klein   DOB:  [birth year]   MRN:  [id number]   Chief Complaint: Foot Pain (Left foot/ankle pain. Swollen. Patient said she twisted her ankle yesterday X 1 day.)  Foot Pain This is a new problem. The current episode started yesterday (a twisting injury). The problem occurs constantly. The problem has been unchanged. Associated symptoms include arthralgias. Pertinent negatives include no chest pain, chills or fatigue.    Review of Systems  Constitutional:  Negative for chills and fatigue.  Respiratory:  Negative for chest tightness and shortness of breath.   Cardiovascular:  Negative for chest pain.  Musculoskeletal:  Positive for arthralgias and gait problem.     Lab Results  Component Value Date   NA 139 09/11/2023   K 5.0 09/11/2023   CO2 20 09/11/2023   GLUCOSE 146 (H) 09/11/2023   BUN 18 09/11/2023   CREATININE 1.06 (H) 09/11/2023   CALCIUM  9.9 09/11/2023   EGFR 56 (L) 09/11/2023   GFRNONAA >60 03/06/2022   Lab Results  Component Value Date   CHOL 160 09/11/2023   HDL 44 09/11/2023   LDLCALC 81 09/11/2023   TRIG 210 (H) 09/11/2023   CHOLHDL 3.6 09/11/2023   Lab Results  Component Value Date   TSH 2.550 09/11/2023   Lab Results  Component Value Date   HGBA1C 8.6 (H) 09/11/2023   Lab Results  Component Value Date   WBC 8.2 09/11/2023   HGB 13.4 09/11/2023   HCT 43.5 09/11/2023   MCV 92 09/11/2023   PLT 322 09/11/2023   Lab Results  Component Value Date   ALT 20 09/11/2023   AST 17 09/11/2023   ALKPHOS 101 09/11/2023   BILITOT 0.6 09/11/2023   Lab Results  Component Value Date   VD25OH 38.6 08/30/2021     Patient Active Problem List   Diagnosis Date Noted   Vasovagal syncope 06/03/2023   Migraine without aura and without status migrainosus, not intractable 02/10/2023   Psoriasis 02/10/2023   Impingement syndrome of shoulder, right 07/09/2021   Cervical paraspinal muscle spasm 07/09/2021   Degeneration  of lumbar intervertebral disc 04/19/2021   Oropharyngeal dysphagia 02/23/2019   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   CKD (chronic kidney disease) stage 3, GFR 30-59 ml/min (HCC) 04/30/2017   Type II diabetes mellitus with complication (HCC) 03/19/2017   Essential hypertension 03/19/2017   CAD (coronary artery disease) 03/19/2017   History of kidney stones 03/19/2017   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   Hyperlipidemia associated with type 2 diabetes mellitus (HCC) 03/19/2017   Nocturnal leg cramps 03/19/2017   Primary insomnia 03/19/2017   Allergic rhinitis 03/19/2017    Allergies  Allergen Reactions   Prednisone Other (See Comments)    Intolerance  - confusion     Past Surgical History:  Procedure Laterality Date   BREAST BIOPSY Right    neg   CATARACT EXTRACTION W/PHACO Left 09/27/2019   Procedure: CATARACT EXTRACTION PHACO AND INTRAOCULAR LENS PLACEMENT (IOC) LEFT DIABETIC ISTENT INJ INTRAVITREAL KENALOG  INJ;  Surgeon: Myrna Adine Anes, MD;  Location: [facility];  Service: Ophthalmology;  Laterality: Left;  2.83 0:28.7   CATARACT EXTRACTION W/PHACO Right 12/13/2019   Procedure: CATARACT EXTRACTION PHACO AND INTRAOCULAR LENS PLACEMENT (IOC) RIGHT ISTENT INJ KENALOG  INJ DIABETIC;  Surgeon: Myrna Adine Anes, MD;  Location: [facility];  Service: Ophthalmology;  Laterality: Right;  1.39 0:21.5   CESAREAN SECTION  Social History   Tobacco Use   Smoking status: Never   Smokeless tobacco: Never  Vaping Use   Vaping status: Never Used  Substance Use Topics   Alcohol use: Never   Drug use: Never     Medication list has been reviewed and updated.  Current Meds  Medication Sig   ACCU-CHEK GUIDE TEST test strip USE TO CHECK BLOOD SUGAR 3 TIMES DAILY   acetaminophen  (TYLENOL ) 500 MG tablet Take 1,000 mg by mouth every 8 (eight) hours as needed.   Ascorbic Acid (VITAMIN C PO) Take by mouth.   aspirin  EC 81 MG tablet Take 1 tablet  (81 mg total) by mouth daily.   atorvastatin  (LIPITOR) 80 MG tablet Take 1 tablet (80 mg total) by mouth daily.   B-D INS SYR ULTRAFINE .5CC/30G 30G X 1/2 0.5 ML MISC USE WITH LANTUS  INJECTIONS FOR  TYPE 2 DIABETES MELLITUS   blood glucose meter kit and supplies Dispense based on patient and insurance preference. Use up to four times daily as directed. (FOR ICD-10 E10.9, E11.9).   clobetasol  (TEMOVATE ) 0.05 % external solution APPLY TOPICALLY TWICE DAILY TO  SCALP LESIONS   clobetasol  ointment (TEMOVATE ) 0.05 % Apply 1 application topically 2 (two) times daily.   DUPIXENT 300 MG/2ML prefilled syringe Inject 300 mg into the skin once.   empagliflozin  (JARDIANCE ) 25 MG TABS tablet Take 1 tablet (25 mg total) by mouth daily before breakfast.   gabapentin  (NEURONTIN ) 100 MG capsule TAKE 1 CAPSULE BY MOUTH 3 TIMES  DAILY   glimepiride  (AMARYL ) 2 MG tablet TAKE 1 TABLET BY MOUTH DAILY  WITH BREAKFAST   hydrocortisone  2.5 % ointment Apply topically 2 (two) times daily. To lesion on arm and buttocks   insulin  glargine (LANTUS ) 100 UNIT/ML injection Inject 25 Units into the skin daily.   meclizine  (ANTIVERT ) 25 MG tablet Take 1 tablet (25 mg total) by mouth 3 (three) times daily as needed for dizziness.   meloxicam  (MOBIC ) 7.5 MG tablet Take 1 tablet (7.5 mg total) by mouth daily.   metFORMIN  (GLUCOPHAGE ) 1000 MG tablet Take 1 tablet (1,000 mg total) by mouth daily with breakfast.   metoprolol  tartrate (LOPRESSOR ) 25 MG tablet Take 1 tablet (25 mg total) by mouth 2 (two) times daily.   montelukast  (SINGULAIR ) 10 MG tablet TAKE 1 TABLET BY MOUTH DAILY   Omega-3 Fatty Acids (FISH OIL) 1000 MG CAPS Take by mouth.   omeprazole  (PRILOSEC) 20 MG capsule TAKE 1 CAPSULE BY MOUTH DAILY   ondansetron  (ZOFRAN ) 4 MG tablet TAKE 1 TABLET BY MOUTH EVERY 8  HOURS AS NEEDED   ONETOUCH DELICA PLUS LANCETS MISC 1 each by Does not apply route 2 (two) times daily.   OZEMPIC, 2 MG/DOSE, 8 MG/3ML SOPN Inject 2 mg into the  skin once a week.   traZODone  (DESYREL ) 50 MG tablet TAKE 1 TABLET BY MOUTH AT  BEDTIME   vitamin B-12 (CYANOCOBALAMIN) 1000 MCG tablet Take 1,000 mcg by mouth daily.       01/12/2024   10:18 AM 09/05/2023   10:04 AM 06/03/2023    8:47 AM 05/06/2023    2:27 PM  GAD 7 : Generalized Anxiety Score  Nervous, Anxious, on Edge 0 0 1 1  Control/stop worrying 0 0 1 1  Worry too much - different things 0 0 1 1  Trouble relaxing 0 0 1 1  Restless 0 0 1 1  Easily annoyed or irritable 0 0 2 1  Afraid - awful might happen  0 0 1 1  Total GAD 7 Score 0 0 8 7  Anxiety Difficulty Not difficult at all Not difficult at all Somewhat difficult        01/12/2024   10:18 AM 09/05/2023   10:04 AM 06/17/2023   11:15 AM  Depression screen PHQ 2/9  Decreased Interest 0 0 0  Down, Depressed, Hopeless 0 0 0  PHQ - 2 Score 0 0 0  Altered sleeping 0 0   Tired, decreased energy 0 0   Change in appetite 0 0   Feeling bad or failure about yourself  0 0   Trouble concentrating 0 0   Moving slowly or fidgety/restless 0 0   Suicidal thoughts 0 0   PHQ-9 Score 0 0    Difficult doing work/chores Not difficult at all Not difficult at all      Data saved with a previous flowsheet row definition    BP Readings from Last 3 Encounters:  01/12/24 120/74  01/09/24 122/74  12/02/23 118/76    Physical Exam Vitals and nursing note reviewed.  Constitutional:      General: She is not in acute distress.    Appearance: Normal appearance. She is well-developed.  HENT:     Head: Normocephalic and atraumatic.  Pulmonary:     Effort: Pulmonary effort is normal. No respiratory distress.  Musculoskeletal:       Feet:  Feet:     Comments: Tenderness and mild swelling lateral left foot Ankle ROM normal Skin:    General: Skin is warm and dry.     Findings: No rash.  Neurological:     Mental Status: She is alert and oriented to person, place, and time.  Psychiatric:        Mood and Affect: Mood normal.         Behavior: Behavior normal.     Wt Readings from Last 3 Encounters:  01/12/24 133 lb (60.3 kg)  01/09/24 133 lb (60.3 kg)  12/02/23 134 lb (60.8 kg)    BP 120/74   Pulse 68   Ht 4' 11 (1.499 m)   Wt 133 lb (60.3 kg)   SpO2 97%   BMI 26.86 kg/m   Assessment and Plan:  Problem List Items Addressed This Visit   None Visit Diagnoses       Left foot pain    -  Primary   s/p twisting injury yesterday suspect stress fracture of metatarsal so will get imaging   Relevant Orders   DG Foot Complete Left       No follow-ups on file.    Leita HILARIO Adie, MD [facility] Primary [facility] and Sports Medicine [place]

## 2024-01-12 NOTE — Telephone Encounter (Signed)
 FYI Only or Action Required?: FYI only for provider: appointment scheduled on 01/12/24.  Patient was last seen in primary care on 01/09/2024 by Elizabeth Leita DEL, MD.  Called Nurse Triage reporting Foot Injury.  Symptoms began yesterday.  Interventions attempted: OTC medications: Tylenol , vicks vapor rub and Other: hot water with salt.  Symptoms are: left foot swelling and pain gradually worsening.  Triage Disposition: See HCP Within 4 Hours (Or PCP Triage)  Patient/caregiver understands and will follow disposition?: Yes             Copied from CRM #[id number]. Topic: Clinical - Red Word Triage >> Jan 12, 2024  7:52 AM Charlet HERO wrote: Red Word that prompted transfer to Nurse Triage: Patient is stating that she spranged her foot yesterday and she is stating that is hurting severly she is also stating that it is hard to walk and swollen. [place] Reason for Disposition  [1] SEVERE pain (e.g., excruciating) AND [2] not improved 2 hours after pain medicine/ice packs  Answer Assessment - Initial Assessment Questions 1. MECHANISM: How did the injury happen? (e.g., twisting injury, direct blow)      Patient states she was walking to the store and was walking on the curb, twisted her left foot. She states her son was walking in front of her and was able to catch her so she didn't fall. She states they continued to the store and she felt pain which progressively worsened throughout the day yesterday.  2. ONSET: When did the injury happen? (e.g., minutes or hours ago)      Teacher, English As A Foreign Language.  3. LOCATION: Where is the injury located?      Left foot.  4. APPEARANCE of INJURY: What does the injury look like?      No bruising or deformities.  5. WEIGHT-BEARING: Can you put weight on that foot? Can you walk (four steps or more)?       She states it is very hard and painful to bear weight or ambulate but states she has been walking on is yesterday and this morning. She states  she is holding onto things when walking.  6. SIZE: For cuts, bruises, or swelling, ask: How large is it? (e.g., inches or centimeters;  entire joint)      Swelling under the foot and above the toes. Mild swelling, describes it as tight.  7. PAIN: Is there pain? If Yes, ask: How bad is the pain? What does it keep you from doing? (Scale 0-10; or none, mild, moderate, severe)     Yes. She states walking on it is 9-9.5/10, currently 8/10.  8. TETANUS: For any breaks in the skin, ask: When was your last tetanus booster?     N/A.  9. OTHER SYMPTOMS: Do you have any other symptoms?      Denies. She states her BG this morning at 0530 was 88 and now is 166.  Patient states she treated her foot pain yesterday with Tylenol  x2, Vicks Vapor Rub, and hot water with salt.  Protocols used: Foot Injury-A-AH

## 2024-01-20 ENCOUNTER — Other Ambulatory Visit: Payer: Self-pay | Admitting: Family Medicine

## 2024-01-20 NOTE — Progress Notes (Signed)
 The [facility] has made a second and final attempt to reach this patient to refill the following medication:DUPIXENT  SYRINGE 300 mg/2 mL syringe (dupilumab ).      We have left voicemails on the following phone numbers: [phone] and have sent a text message to the following phone numbers: [phone].    Dates contacted: 01/14/24-01/20/24  Last scheduled delivery: 12/24/23    The patient may be at risk of non-compliance with this medication. The patient should call the [facility] at [phone]  Option 4, then Option 2: Dermatology, Gastroenterology, Rheumatology to refill medication.    Dena LOISE [facility] Oncologist

## 2024-01-20 NOTE — Progress Notes (Signed)
 [facility] Refill Coordination Note    Specialty Medication(s) to be Shipped:   Inflammatory Disorders: Dupixent     Other medication(s) to be shipped: No additional medications requested for fill at this time    Specialty Medications not needed at this time: N/A     Lauriana Chiang, DOB: [birth year]  Phone: There are no phone numbers on file.      All above HIPAA information was verified with patient.     Was a nurse, learning disability used for this call? No    Completed refill call assessment today to schedule patient's medication shipment from the [facility] and [facility] Delivery Pharmacy  [phone]).  All relevant notes have been reviewed.     Specialty medication(s) and dose(s) confirmed: Regimen is correct and unchanged.   Changes to medications: Nupur reports no changes at this time.  Changes to insurance: No  New side effects reported not previously addressed with a pharmacist or physician: None reported  Questions for the pharmacist: No    Confirmed patient received a Conservation Officer, Historic Buildings and a Surveyor, Mining with first shipment. The patient will receive a drug information handout for each medication shipped and additional FDA Medication Guides as required.       DISEASE/MEDICATION-SPECIFIC INFORMATION        For patients on injectable medications: Next injection is scheduled for 01/21/24.    SPECIALTY MEDICATION ADHERENCE     Medication Adherence    Patient reported X missed doses in the last month: 0  Specialty Medication: DUPIXENT  SYRINGE 300 mg/2 mL syringe (dupilumab )  Patient is on additional specialty medications: No              Were doses missed due to medication being on hold? No    Dupixent  300/2 mg/ml: 0 doses of medicine on hand        REFERRAL TO PHARMACIST     Referral to the pharmacist: Not needed      [facility]     Shipping address confirmed in Epic.     Cost and Payment: Patient has a $0 copay, payment information is not required.    Delivery Scheduled: Yes, Expected medication delivery date: 01/21/24.     Medication will be delivered via Same Day Courier to the prescription address in Epic [place].    Kelly CHRISTELLA Eagles   [facility] and [facility] Delivery Pharm[facility]  Specialty Technician

## 2024-01-21 MED FILL — DUPIXENT 300 MG/2 ML SUBCUTANEOUS SYRINGE: SUBCUTANEOUS | 28 days supply | Qty: 4 | Fill #7

## 2024-02-10 ENCOUNTER — Other Ambulatory Visit: Payer: Self-pay

## 2024-02-10 DIAGNOSIS — G4700 Insomnia, unspecified: Secondary | ICD-10-CM

## 2024-02-10 LAB — OPHTHALMOLOGY REPORT-SCANNED

## 2024-02-10 MED ORDER — TRAZODONE HCL 50 MG PO TABS: 50.0000 mg | ORAL_TABLET | Freq: Every day | ORAL | 0 refills | Status: AC

## 2024-02-12 NOTE — Progress Notes (Signed)
 [facility] Delivery Pharm[facility] Refill Coordination Note    Specialty Medication(s) to be Shipped:   Inflammatory Disorders: Dupixent     Other medication(s) to be shipped: No additional medications requested for fill at this time    Specialty Medications not needed at this time: N/A     Monique Coleman, DOB: 09-05-1950  Phone: There are no phone numbers on file.      All above HIPAA information was verified with patient.     Was a nurse, learning disability used for this call? No    Completed refill call assessment today to schedule patient's medication shipment from the [facility] and [facility] Delivery Pharmacy  [phone]).  All relevant notes have been reviewed.     Specialty medication(s) and dose(s) confirmed: Regimen is correct and unchanged.   Changes to medications: Quintessa reports no changes at this time.  Changes to insurance: No  New side effects reported not previously addressed with a pharmacist or physician: None reported  Questions for the pharmacist: No    Confirmed patient received a Conservation Officer, Historic Buildings and a Surveyor, Mining with first shipment. The patient will receive a drug information handout for each medication shipped and additional FDA Medication Guides as required.       DISEASE/MEDICATION-SPECIFIC INFORMATION        N/A    SPECIALTY MEDICATION ADHERENCE     Medication Adherence    Patient reported X missed doses in the last month: 0  Specialty Medication: DUPIXENT  SYRINGE 300 mg/2 mL syringe (dupilumab )  Patient is on additional specialty medications: No              Were doses missed due to medication being on hold? No     DUPIXENT  SYRINGE 300 mg/2 mL syringe (dupilumab ): 1 doses of medicine on hand     Specialty medication is an injection or given on a cycle: Yes, Next injection is scheduled for 02/13/24.    REFERRAL TO PHARMACIST     Referral to the pharmacist: Not needed      [facility]     Shipping address confirmed in Epic.     Cost and Payment: Patient has a $0 copay, payment information is not required.    Delivery Scheduled: Yes, Expected medication delivery date: 02/24/24.     Medication will be delivered via Next Day Courier to the prescription address in Epic [place].    Monique Coleman   [facility] and Home Delivery Pharm[facility]  Specialty Technician

## 2024-02-23 MED FILL — DUPIXENT 300 MG/2 ML SUBCUTANEOUS SYRINGE: SUBCUTANEOUS | 28 days supply | Qty: 4 | Fill #8

## 2024-03-04 ENCOUNTER — Ambulatory Visit: Admitting: Family Medicine

## 2024-03-04 ENCOUNTER — Ambulatory Visit
Admission: RE | Admit: 2024-03-04 | Discharge: 2024-03-04 | Disposition: A | Discharge status: Home / Self Care | Attending: Family Medicine | Admitting: Family Medicine

## 2024-03-04 ENCOUNTER — Ambulatory Visit
Admission: RE | Admit: 2024-03-04 | Discharge: 2024-03-04 | Disposition: A | Discharge status: Home / Self Care | Source: Ambulatory Visit | Attending: Family Medicine | Admitting: Family Medicine

## 2024-03-04 ENCOUNTER — Ambulatory Visit: Payer: Self-pay | Admitting: Family Medicine

## 2024-03-04 ENCOUNTER — Ambulatory Visit: Payer: Self-pay

## 2024-03-04 ENCOUNTER — Encounter: Payer: Self-pay | Admitting: Family Medicine

## 2024-03-04 VITALS — BP 86/54 | HR 82 | Ht 59.0 in | Wt 135.0 lb

## 2024-03-04 DIAGNOSIS — W11XXXA Fall on and from ladder, initial encounter: Secondary | ICD-10-CM | POA: Diagnosis not present

## 2024-03-04 DIAGNOSIS — M25551 Pain in right hip: Secondary | ICD-10-CM

## 2024-03-04 MED ORDER — BACLOFEN 5 MG PO TABS: 5.0000 mg | ORAL_TABLET | Freq: Three times a day (TID) | ORAL | 0 refills | Status: DC | PRN

## 2024-03-04 NOTE — Progress Notes (Signed)
 "    Primary Care / Sports Medicine Office Visit  Patient Information:  Patient ID: Elizabeth Klein, female DOB: 02/04/1951 Age: 74 y.o. MRN: [id number]   Susane Bey is a pleasant 74 y.o. female presenting with the following:  Chief Complaint  Patient presents with   Back Pain    Low back pain since 02/22/24. She had a fall off of a ladder in the house. She is having weakness in her right leg now, she is having to pick her leg up and move it manually to get in bed and in and out of car.    Vitals:   03/04/24 1449  BP: (!) 86/54  Pulse: 82  SpO2: 98%   Vitals:   03/04/24 1449  Weight: 135 lb (61.2 kg)  Height: 4' 11 (1.499 m)   Body mass index is 27.27 kg/m.  No results found.   Discussed the use of AI scribe software for clinical note transcription with the patient, who gave verbal consent to proceed.   Independent interpretation of notes and tests performed by another provider:   None  Procedures performed:   None  Pertinent History, Exam, Impression, and Recommendations:   History of Present Illness Leone Zharia Conrow is a 74 year old female who presents with right hip pain and headache following a fall.  Right hip and lower back pain - On December 27th, she fell backwards from a ladder while fixing a curtain, landing on a thick carpet and striking her back and head. - Pain is persistent and localized to the lower back and right buttock. - Pain is exacerbated by leg movement. - Pain limits ambulation and makes climbing into bed difficult, but she continues to perform essential household tasks. - No prior medical evaluation or imaging for this injury. - No use of pain medications or other therapies since the fall.  Headache following head injury - Struck head during the fall but did not lose consciousness. - Frontal headache developed several days after the fall, not present immediately after the injury. - Headache began today. - No  associated visual changes or other neurological symptoms.  Functional status and independence - Remained on the floor for approximately seven minutes after the fall before ambulating to her bedroom. - Continues to perform essential household tasks despite pain. - Emphasizes maintaining independence in daily activities.  Physical Exam  GENERAL Elderly female, alert and cooperative. Appears uncomfortable with movement of the right lower extremity otherwise pleasant.  NEUROLOGICAL Mental Status: Awake, alert, oriented. Cranial Nerves: Cranial nerves II-XII grossly intact without deficit. Motor: Motor function grossly intact in all extremities aside from pain-limited right lower extremity movement. Sensation: Sensation grossly intact to light touch. No focal neurologic deficits appreciated.  HEAD INSPECTION: Normocephalic, atraumatic. No scalp lacerations, hematoma, or deformity. PALPATION: No scalp tenderness or step-offs.  CERVICAL SPINE INSPECTION: Normal alignment without deformity. PALPATION: No midline cervical tenderness.  RIGHT HIP AND LOWER EXTREMITY INSPECTION: No gross deformity, shortening, or external rotation noted at rest. PALPATION: Tenderness to palpation throughout the right leg, most pronounced at the hip region. RANGE OF MOTION: Pain elicited with internal rotation of the right lower extremity. STRENGTH: Strength testing limited by pain. NEUROVASCULAR: Distal sensation intact. Distal pulses palpable. Capillary refill brisk.  Assessment and Plan Right hip pain after fall Persistent right hip and buttock pain with impaired mobility suggests possible fracture or significant injury. - Ordered stat right hip radiograph today. - Advised activity limitation to essential movements until imaging results are available. - Instructed her  to await office follow-up regarding imaging results and further management.  Head injury with headache after fall Occipital head trauma  with delayed frontal headache raises concern for intracranial injury due to age-related cerebral atrophy. - Ordered stat non-contrast head CT; she will be contacted for scheduling within 24 hours. - Instructed her to keep her phone available for CT scheduling. - Advised monitoring for symptom progression and activity limitation until imaging results are available. - Instructed her to await office follow-up regarding imaging results and further management.  Problem List Items Addressed This Visit   None Visit Diagnoses       Acute pain of right hip    -  Primary   Relevant Orders   DG Hip Unilat W OR W/O Pelvis 2-3 Views Right     Fall from ladder, initial encounter       Relevant Orders   CT HEAD WO CONTRAST ( )        Orders & Medications Medications: No orders of the defined types were placed in this encounter.  Orders Placed This Encounter  Procedures   DG Hip Unilat W OR W/O Pelvis 2-3 Views Right   CT HEAD WO CONTRAST ( )     No follow-ups on file.     Selinda JINNY Ku, MD, [facility]   Primary Care Sports Medicine Primary Care and Sports Medicine at [facility]   "

## 2024-03-04 NOTE — Telephone Encounter (Signed)
 FYI Only or Action Required?: FYI only for provider: appointment scheduled on 03/04/24.  Patient was last seen in primary care on 01/12/2024 by Justus Leita DEL, MD.  Called Nurse Triage reporting Back Pain.  Symptoms began 2 weeks ago.  Interventions attempted: OTC medications: Tylenol , Vicks Vapor rub.  Symptoms are: unchanged.  Triage Disposition: See PCP When Office is Open (Within 3 Days)  Patient/caregiver understands and will follow disposition?: Yes   Reason for Disposition  [1] MODERATE back pain (e.g., interferes with normal activities) AND [2] present > 3 days  Answer Assessment - Initial Assessment Questions 1. ONSET: When did the pain begin? (e.g., minutes, hours, days)     Around 02/23/24  2. LOCATION: Where does it hurt? (upper, mid or lower back)     Lower back, buttocks area right leg  3. SEVERITY: How bad is the pain?  (e.g., Scale 1-10; mild, moderate, or severe)     7-8/10, hard to get up out of bed or put pants on  4. PATTERN: Is the pain constant? (e.g., yes, no; constant, intermittent)      Constant  5. RADIATION: Does the pain shoot into your legs or somewhere else?     Denies  6. CAUSE:  What do you think is causing the back pain?      Had a fall, was fixing curtains and fell backwards off 2nd step of ladder, landed on back, buttocks and head  7. BACK OVERUSE:  Any recent lifting of heavy objects, strenuous work or exercise?     Denies  8. MEDICINES: What have you taken so far for the pain? (e.g., nothing, acetaminophen , NSAIDS)     Tylenol , Vicks vapor rub  9. NEUROLOGIC SYMPTOMS: Do you have any weakness, numbness, or problems with bowel/bladder control?     Denies  10. OTHER SYMPTOMS: Do you have any other symptoms? (e.g., fever, abdomen pain, burning with urination, blood in urine)       Mild headaches  Protocols used: Back Pain-A-AH  Copied from CRM #[id number]. Topic: Clinical - Red Word Triage >> Mar 04, 2024  8:44  AM Olam RAMAN wrote: Red Word that prompted transfer to Nurse Triage: Pt had a fall in dec, and thought pain would go away but pain is still there and not able to lift R leg by buttocks. Also stated she hit her head. Fell on back, most pain is on back side

## 2024-03-04 NOTE — Patient Instructions (Signed)
 VISIT SUMMARY:  Today, you were seen for right hip pain and a headache following a fall from a ladder. We discussed your symptoms, ordered imaging tests, and provided instructions for activity limitation and follow-up.  YOUR PLAN:  RIGHT HIP PAIN AFTER FALL: You have persistent pain in your right hip and buttock, which may indicate a fracture or significant injury. -We ordered an urgent X-ray of your right hip today. -Limit your activities to essential movements until we have the imaging results. -We will follow up with you regarding the imaging results and further management.  HEAD INJURY WITH HEADACHE AFTER FALL: You have a headache that started several days after hitting your head during the fall, which could indicate a more serious injury. -We ordered an urgent non-contrast CT scan of your head. You will be contacted to schedule this. -Keep your phone available for the CT scheduling call. -Monitor your symptoms and limit your activities until we have the imaging results. -We will follow up with you regarding the imaging results and further management.

## 2024-03-04 NOTE — Telephone Encounter (Signed)
 Noted  Pt has appt.  KP

## 2024-03-04 NOTE — Telephone Encounter (Signed)
 Unable to get transport at that time-- needs earlier appt-- scheduled with Dr Alvia at 240pm today

## 2024-03-04 NOTE — Telephone Encounter (Signed)
 Fyi. Patient is schedule for this afternoon.

## 2024-03-04 NOTE — Telephone Encounter (Signed)
 Ty!

## 2024-03-05 ENCOUNTER — Ambulatory Visit
Admission: RE | Admit: 2024-03-05 | Discharge: 2024-03-05 | Disposition: A | Source: Ambulatory Visit | Attending: Family Medicine

## 2024-03-05 DIAGNOSIS — M545 Low back pain, unspecified: Secondary | ICD-10-CM | POA: Diagnosis not present

## 2024-03-05 DIAGNOSIS — R519 Headache, unspecified: Secondary | ICD-10-CM | POA: Insufficient documentation

## 2024-03-05 DIAGNOSIS — W11XXXA Fall on and from ladder, initial encounter: Secondary | ICD-10-CM | POA: Insufficient documentation

## 2024-03-05 NOTE — Progress Notes (Signed)
 Noted. Is pt notified. We could order MRI.

## 2024-03-05 NOTE — Progress Notes (Signed)
 Patient had fall off of ladder with head trauma, negative for acute process but incidentally noted pituitary mass - FYI for further imaging

## 2024-03-05 NOTE — Progress Notes (Signed)
 Please schedule a TOC to an earlier date.  [name]  [name] [name]

## 2024-03-08 NOTE — Progress Notes (Signed)
 Okay. Since its a incidental finding not sure if we need MRI, would rather defer it to Neuro?  Will see her for TOC in march.  Elizabeth Klein could you move her to an earlier date.   Ty

## 2024-03-11 ENCOUNTER — Encounter: Payer: Self-pay | Admitting: Family Medicine

## 2024-03-11 ENCOUNTER — Ambulatory Visit: Admitting: Family Medicine

## 2024-03-11 VITALS — BP 100/60 | HR 82 | Temp 98.1°F | Ht 59.0 in | Wt 137.4 lb

## 2024-03-11 DIAGNOSIS — R110 Nausea: Secondary | ICD-10-CM

## 2024-03-11 DIAGNOSIS — D352 Benign neoplasm of pituitary gland: Secondary | ICD-10-CM | POA: Diagnosis not present

## 2024-03-11 NOTE — Progress Notes (Signed)
 "  Established Patient Office Visit  Patient ID: Elizabeth Klein, female    DOB: [birth year]  Age: 74 y.o. MRN: [id number] PCP: Reginaldo Hazard K, MD  No chief complaint on file.   Subjective:     HPI  Discussed the use of AI scribe software for clinical note transcription with the patient, who gave verbal consent to proceed.  History of Present Illness Elizabeth Klein is a 74 year old female who presents with headaches and vision concerns following a fall.  She has a long-standing history of headaches, initially diagnosed as migraines during her teenage years. These headaches subsided after the birth of her son but have recently recurred, prompting her to take two Tylenol  for relief. The recurrence of headaches led to further investigation.  She experienced a fall from a ladder while attempting to reach a cupboard, resulting in back pain and a head injury. She recalls hitting her head and experiencing body aches. She was alone for several minutes before managing to get up after the fall.  Imaging studies revealed a small pituitary mass, which was an incidental finding. She has been experiencing headaches and occasional double vision. No significant loss of vision is reported, but she mentions regular vision problems.  She has a history of nausea, particularly in the mornings, for which she takes Zofran  as needed. She is also diabetic, which may contribute to her nausea due to slowed digestion.  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 She has been the primary financial provider for her family, including her son, Elizabeth Klein, who has not worked regularly.    Review of Systems  All other systems reviewed and are negative.     Objective:     Ht 4' 11 (1.499 m)   BMI 27.27 kg/m     Physical Exam Vitals and nursing note reviewed.  Constitutional:      Appearance:  Normal appearance.  HENT:     Head: Normocephalic.     Right Ear: External ear normal.     Left Ear: External ear normal.  Eyes:     Conjunctiva/sclera: Conjunctivae normal.  Cardiovascular:     Rate and Rhythm: Normal rate.  Pulmonary:     Effort: Pulmonary effort is normal. No respiratory distress.  Abdominal:     Palpations: Abdomen is soft.  Musculoskeletal:        General: Normal range of motion.  Skin:    General: Skin is warm.  Neurological:     Mental Status: She is alert and oriented to person, place, and time.  Psychiatric:        Mood and Affect: Mood normal.     Physical Exam     No results found for any visits on 03/11/24.     The ASCVD Risk score (Arnett DK, et al., 2019) failed to calculate for the following reasons:   The valid systolic blood pressure range is 90 to 200 mmHg    Assessment & Plan:   Problem List Items Addressed This Visit   None   Assessment and Plan Assessment & Plan Pituitary adenoma Incidental pituitary mass on CT with no significant symptoms. Discussed potential effects and vision loss if mass enlarges. Prefers neurologist consultation over MRi. - Referred to neurologist for further evaluation.  Type 2 diabetes mellitus Nausea likely due to delayed gastric emptying from diabetes, managed with Zofran . - Continue Zofran  as needed for nausea.  No follow-ups on file.    Sostenes Kauffmann K Duana Benedict, MD [facility] Primary Care & Sports Medicine at [facility]   "

## 2024-03-16 DIAGNOSIS — L281 Prurigo nodularis: Principal | ICD-10-CM

## 2024-03-16 DIAGNOSIS — D1801 Hemangioma of skin and subcutaneous tissue: Principal | ICD-10-CM

## 2024-03-16 DIAGNOSIS — L821 Other seborrheic keratosis: Principal | ICD-10-CM

## 2024-03-16 NOTE — Progress Notes (Signed)
 Dermatology Note     Assessment and Plan:      Benign Lesions/ Findings:   Angioma(s)  Seborrheic Keratosis(es) - no irritation noted  - Reassurance provided regarding the benign appearance of lesions noted on exam today; no treatment is indicated in the absence of symptoms/changes.  - Reinforced importance of photoprotective strategies including liberal and frequent sunscreen use of a broad-spectrum SPF 30 or greater, use of protective clothing, and sun avoidance for prevention of cutaneous malignancy and photoaging.  Counseled patient on the importance of regular self-skin monitoring as well as routine clinical skin examinations as scheduled.     Prurigo nodularis - Severe, chronic, extensively involving patients back, buttocks, bilateral upper and lower extremities, innumerable nodules (>20 nodules), now significantly improved since restarting Dupixent , mild flare on the frontal scalp today  - Patient previous on dupilumab  pen injections, but stopped because she disliked injecting herself with autopens. Severely flared off Dupixent   - Since starting dupilumab  syringes, she is much improved. She finds the syringes much easier to use  - She still has numerous nodules but they are much flatter than previous and she does not seem to be getting new ones  - Advised against excoriation (discussed itch-scratch cycle), keep nails short  - Mosturization with Eucerin or Vaseline (purchase Cerave if desired), lukewarm showers, Dove sensitive skin soap to axilla/groin  - continue dupilumab  300 mg/mL syringes. Inject into the skin subcutaneously every 7 days.   - Restart clobetasol  solution aaa bid prn for itching on scalp  - Discussed treatment with intralesional kenalog for active lesions on the frontal scalp, patient declined today.     The patient was advised to call for an appointment should any new, changing, or symptomatic lesions develop.     RTC: Return in about 6 months (around 09/13/2024) for prurigo nodularis. or sooner as needed   _________________________________________________________________      Chief Complaint     Chief Complaint   Patient presents with    Lesion Of Concern     Follow up for psoriasis        HPI     Monique Coleman is a 74 y.o. female who presents as a returning patient (last seen 09/09/2023) to Dermatology for follow up of prurigo nodularis. At [facility], patient continued on dupixent .     Today, reports:  - doing well on dupixent  without side effects. Tolerating medication and finds it to be helpful.  - she has a recent flare on her frontal scalp that has been very itchy. Using clobetasol  solution, which she finds to be helpful. No active lesions on her arms or legs.  - also wonders about a few spots on her upper chest that are asymptomatic        The patient denies any other new or changing lesions or areas of concern.     Pertinent Past Medical History     No history of skin cancer     Family History:   Negative for melanoma    Past Medical History, Family History, Social History, Medication List, Allergies, and Problem List were reviewed in the rooming section of Epic.     ROS: Other than symptoms mentioned in the HPI, no fevers, chills, or other skin complaints    Physical Examination     GENERAL: Well-appearing female in no acute distress, resting comfortably.  NEURO: Alert and oriented, answers questions appropriately  PSYCH: Normal mood and affect  MOUTH: Inspection of lips, oropharynx, and gums without erythema or pigment  change  SKIN (Focal Skin Exam): Per patient request, examination of scalp, face, upper chest, bilateral upper and lower extremities was performed  - excoriated hyperkeratotic nodules on the frontal scalp  - hyperpigmented macules on the bilateral forearms, dorsal hands, legs    - Angioma(s): Scattered red vascular papule(s) on the chest  - Seborrheic Keratosis(es): Stuck-on appearing keratotic papule(s) scattered diffusely        All areas not commented on are within normal limits or unremarkable      (Approved Template 03/29/2023)

## 2024-03-30 ENCOUNTER — Other Ambulatory Visit: Payer: Self-pay | Admitting: Family Medicine

## 2024-03-31 ENCOUNTER — Other Ambulatory Visit: Payer: Self-pay | Admitting: "Endocrinology

## 2024-03-31 DIAGNOSIS — D497 Neoplasm of unspecified behavior of endocrine glands and other parts of nervous system: Secondary | ICD-10-CM

## 2024-03-31 NOTE — Telephone Encounter (Signed)
 Pt has requested 90 day supply. LB

## 2024-03-31 NOTE — Telephone Encounter (Signed)
 Requested medication (s) are due for refill today - unsure  Requested medication (s) are on the active medication list -yes  Future visit scheduled -yes  Last refill: 03/04/24 #90  Notes to clinic: Acute visit Rx- original Rx does not have refills- is this medication  provider wants to continue?  Requested Prescriptions  Pending Prescriptions Disp Refills   Baclofen  5 MG TABS [Pharmacy Med Name: BACLOFEN  5 MG TABLET] 270 tablet 1    Sig: TAKE 1 TABLET BY MOUTH EVERY 8 HOURS AS NEEDED.     Analgesics:  Muscle Relaxants - baclofen  Failed - 03/31/2024  3:57 PM      Failed - Cr in normal range and within 180 days    Creatinine, Ser  Date Value Ref Range Status  09/11/2023 1.06 (H) 0.57 - 1.00 mg/dL Final         Failed - eGFR is 30 or above and within 180 days    GFR calc Af Amer  Date Value Ref Range Status  08/23/2019 >60 >60 mL/min Final   GFR, Estimated  Date Value Ref Range Status  03/06/2022 >60 >60 mL/min Final    Comment:    (NOTE) Calculated using the CKD-EPI Creatinine Equation (2021)    eGFR  Date Value Ref Range Status  09/11/2023 56 (L) >59 mL/min/1.73 Final         Passed - Valid encounter within last 6 months    Recent Outpatient Visits           2 weeks ago Pituitary adenoma [facility])   [place] [facility], Vinay K, MD   3 weeks ago Acute pain of right hip   [place] [facility], Selinda PARAS, MD   2 months ago Left foot pain   [place] [facility] at [facility], Leita DEL, MD   2 months ago Type II diabetes mellitus with complication [facility])   [place] [facility] at [facility], Leita DEL, MD   4 months ago Essential hypertension   [place] [facility] at [facility], Leita DEL, MD                 Requested Prescriptions  Pending Prescriptions Disp  Refills   Baclofen  5 MG TABS [Pharmacy Med Name: BACLOFEN  5 MG TABLET] 270 tablet 1    Sig: TAKE 1 TABLET BY MOUTH EVERY 8 HOURS AS NEEDED.     Analgesics:  Muscle Relaxants - baclofen  Failed - 03/31/2024  3:57 PM      Failed - Cr in normal range and within 180 days    Creatinine, Ser  Date Value Ref Range Status  09/11/2023 1.06 (H) 0.57 - 1.00 mg/dL Final         Failed - eGFR is 30 or above and within 180 days    GFR calc Af Amer  Date Value Ref Range Status  08/23/2019 >60 >60 mL/min Final   GFR, Estimated  Date Value Ref Range Status  03/06/2022 >60 >60 mL/min Final    Comment:    (NOTE) Calculated using the CKD-EPI Creatinine Equation (2021)    eGFR  Date Value Ref Range Status  09/11/2023 56 (L) >59 mL/min/1.73 Final         Passed - Valid encounter within last 6 months    Recent Outpatient Visits  2 weeks ago Pituitary adenoma [facility])   [place] [facility] at [facility], Vinay K, MD   3 weeks ago Acute pain of right hip   [facility], Selinda PARAS, MD   2 months ago Left foot pain   [place] [facility] at [facility], Leita DEL, MD   2 months ago Type II diabetes mellitus with complication [facility])   [place] [facility] at [facility], Leita DEL, MD   4 months ago Essential hypertension   [facility] at [facility], Leita DEL, MD

## 2024-03-31 NOTE — Progress Notes (Signed)
 [facility] Refill Coordination Note    Specialty Medication(s) to be Shipped:   Inflammatory Disorders: Dupixent     Other medication(s) to be shipped: No additional medications requested for fill at this time    Specialty Medications not needed at this time: N/A     Monique Coleman, DOB: Apr 11, 1950  Phone: There are no phone numbers on file.      All above HIPAA information was verified with patient.     Was a nurse, learning disability used for this call? No    Completed refill call assessment today to schedule patient's medication shipment from the [facility]  [phone]).  All relevant notes have been reviewed.     Specialty medication(s) and dose(s) confirmed: Regimen is correct and unchanged.   Changes to medications: Monique Coleman reports no changes at this time.  Changes to insurance: No  New side effects reported not previously addressed with a pharmacist or physician: None reported  Questions for the pharmacist: No    Confirmed patient received a Conservation Officer, Historic Buildings and a Surveyor, Mining with first shipment. The patient will receive a drug information handout for each medication shipped and additional FDA Medication Guides as required.       DISEASE/MEDICATION-SPECIFIC INFORMATION        N/A    SPECIALTY MEDICATION ADHERENCE     Medication Adherence    Patient reported X missed doses in the last month: 0  Specialty Medication: DUPIXENT  SYRINGE 300 mg/2 mL syringe (dupilumab )  Patient is on additional specialty medications: No              Were doses missed due to medication being on hold? No      DUPIXENT  SYRINGE 300 mg/2 mL syringe (dupilumab ): 0 doses of medicine on hand       Specialty medication is an injection or given on a cycle: Yes, Next injection is scheduled for 04/02/24.    REFERRAL TO PHARMACIST     Referral to the pharmacist: Not needed      [facility]     Shipping address confirmed in Epic.     Cost and Payment: Patient has a copay of $12.65. They are aware and have authorized the pharmacy to charge the credit card on file.    Delivery Scheduled: Yes, Expected medication delivery date: 04/02/24.     Medication will be delivered via Next Day Courier to the prescription address in Epic WAM.    Monique Coleman   [facility]  Specialty Technician

## 2024-04-01 ENCOUNTER — Telehealth: Payer: Self-pay | Admitting: Family Medicine

## 2024-04-01 MED FILL — DUPIXENT 300 MG/2 ML SUBCUTANEOUS SYRINGE: SUBCUTANEOUS | 28 days supply | Qty: 4 | Fill #9

## 2024-04-01 NOTE — Telephone Encounter (Unsigned)
 Copied from CRM [id number]. Topic: Clinical - Medical Advice >> Apr 01, 2024  2:54 PM Hadassah PARAS wrote: Reason for CRM: Pt was called to schedule an app for MRI for 2/9. Pt is being quoted $250 and pt would like to know if this exam is really necessary. Please call pt back on #[phone]

## 2024-04-01 NOTE — Telephone Encounter (Signed)
 Please advise so I can call patient back.  jm

## 2024-04-02 NOTE — Telephone Encounter (Signed)
 Spoke with patient and informed her Dr. Alvia did not order imaging that Dr. Cherilyn ordered imaging and she should call his office and see if it is pertinent that she get imaging done.

## 2024-04-04 ENCOUNTER — Ambulatory Visit: Admission: RE | Admit: 2024-04-04

## 2024-05-07 ENCOUNTER — Encounter: Admitting: Family Medicine

## 2024-05-13 ENCOUNTER — Ambulatory Visit

## 2024-05-20 ENCOUNTER — Ambulatory Visit
